# Patient Record
Sex: Female | Born: 1975 | Race: White | Hispanic: No | Marital: Single | Smoking: Current every day smoker
Health system: Southern US, Community
[De-identification: ages and names within clinical notes are randomized; demographics above are authoritative.]

## PROBLEM LIST (undated history)

## (undated) DIAGNOSIS — F329 Major depressive disorder, single episode, unspecified: Secondary | ICD-10-CM

## (undated) DIAGNOSIS — Z9889 Other specified postprocedural states: Secondary | ICD-10-CM

## (undated) DIAGNOSIS — I10 Essential (primary) hypertension: Secondary | ICD-10-CM

## (undated) DIAGNOSIS — I219 Acute myocardial infarction, unspecified: Secondary | ICD-10-CM

## (undated) DIAGNOSIS — K219 Gastro-esophageal reflux disease without esophagitis: Secondary | ICD-10-CM

## (undated) DIAGNOSIS — R102 Pelvic and perineal pain: Secondary | ICD-10-CM

## (undated) DIAGNOSIS — F32A Depression, unspecified: Secondary | ICD-10-CM

## (undated) DIAGNOSIS — R109 Unspecified abdominal pain: Secondary | ICD-10-CM

## (undated) DIAGNOSIS — G8929 Other chronic pain: Secondary | ICD-10-CM

## (undated) DIAGNOSIS — F419 Anxiety disorder, unspecified: Secondary | ICD-10-CM

## (undated) DIAGNOSIS — Z9289 Personal history of other medical treatment: Secondary | ICD-10-CM

## (undated) DIAGNOSIS — F410 Panic disorder [episodic paroxysmal anxiety] without agoraphobia: Secondary | ICD-10-CM

## (undated) HISTORY — PX: NO PAST SURGERIES: SHX2092

---

## 2014-05-31 ENCOUNTER — Emergency Department (HOSPITAL_COMMUNITY)
Admission: EM | Admit: 2014-05-31 | Discharge: 2014-05-31 | Disposition: A | Discharge status: Home / Self Care | Payer: Self-pay | Attending: Emergency Medicine | Admitting: Emergency Medicine

## 2014-05-31 ENCOUNTER — Emergency Department (HOSPITAL_COMMUNITY): Payer: Self-pay

## 2014-05-31 ENCOUNTER — Encounter (HOSPITAL_COMMUNITY): Payer: Self-pay | Admitting: *Deleted

## 2014-05-31 DIAGNOSIS — I10 Essential (primary) hypertension: Secondary | ICD-10-CM | POA: Insufficient documentation

## 2014-05-31 DIAGNOSIS — E119 Type 2 diabetes mellitus without complications: Secondary | ICD-10-CM | POA: Insufficient documentation

## 2014-05-31 DIAGNOSIS — R1011 Right upper quadrant pain: Secondary | ICD-10-CM | POA: Insufficient documentation

## 2014-05-31 DIAGNOSIS — R0602 Shortness of breath: Secondary | ICD-10-CM | POA: Insufficient documentation

## 2014-05-31 DIAGNOSIS — Z792 Long term (current) use of antibiotics: Secondary | ICD-10-CM | POA: Insufficient documentation

## 2014-05-31 DIAGNOSIS — Z880 Allergy status to penicillin: Secondary | ICD-10-CM | POA: Insufficient documentation

## 2014-05-31 DIAGNOSIS — Z3202 Encounter for pregnancy test, result negative: Secondary | ICD-10-CM | POA: Insufficient documentation

## 2014-05-31 DIAGNOSIS — R05 Cough: Secondary | ICD-10-CM | POA: Insufficient documentation

## 2014-05-31 DIAGNOSIS — Z720 Tobacco use: Secondary | ICD-10-CM | POA: Insufficient documentation

## 2014-05-31 DIAGNOSIS — F419 Anxiety disorder, unspecified: Secondary | ICD-10-CM | POA: Insufficient documentation

## 2014-05-31 DIAGNOSIS — R079 Chest pain, unspecified: Secondary | ICD-10-CM | POA: Insufficient documentation

## 2014-05-31 DIAGNOSIS — Z79899 Other long term (current) drug therapy: Secondary | ICD-10-CM | POA: Insufficient documentation

## 2014-05-31 HISTORY — DX: Essential (primary) hypertension: I10

## 2014-05-31 HISTORY — DX: Anxiety disorder, unspecified: F41.9

## 2014-05-31 LAB — POC URINE PREG, ED: PREG TEST UR: NEGATIVE

## 2014-05-31 LAB — CBC WITH DIFFERENTIAL/PLATELET
BASOS ABS: 0.1 10*3/uL (ref 0.0–0.1)
BASOS PCT: 1 % (ref 0–1)
EOS ABS: 0.2 10*3/uL (ref 0.0–0.7)
Eosinophils Relative: 2 % (ref 0–5)
HCT: 45.1 % (ref 36.0–46.0)
Hemoglobin: 14.8 g/dL (ref 12.0–15.0)
Lymphocytes Relative: 28 % (ref 12–46)
Lymphs Abs: 2.7 10*3/uL (ref 0.7–4.0)
MCH: 32.9 pg (ref 26.0–34.0)
MCHC: 32.8 g/dL (ref 30.0–36.0)
MCV: 100.2 fL — ABNORMAL HIGH (ref 78.0–100.0)
Monocytes Absolute: 0.6 10*3/uL (ref 0.1–1.0)
Monocytes Relative: 6 % (ref 3–12)
NEUTROS ABS: 6.2 10*3/uL (ref 1.7–7.7)
NEUTROS PCT: 63 % (ref 43–77)
PLATELETS: 240 10*3/uL (ref 150–400)
RBC: 4.5 MIL/uL (ref 3.87–5.11)
RDW: 13.8 % (ref 11.5–15.5)
WBC: 9.8 10*3/uL (ref 4.0–10.5)

## 2014-05-31 LAB — COMPREHENSIVE METABOLIC PANEL
ALBUMIN: 3.5 g/dL (ref 3.5–5.2)
ALK PHOS: 79 U/L (ref 39–117)
ALT: 11 U/L (ref 0–35)
AST: 14 U/L (ref 0–37)
Anion gap: 11 (ref 5–15)
BUN: 7 mg/dL (ref 6–23)
CHLORIDE: 105 meq/L (ref 96–112)
CO2: 25 mEq/L (ref 19–32)
Calcium: 8.6 mg/dL (ref 8.4–10.5)
Creatinine, Ser: 0.82 mg/dL (ref 0.50–1.10)
GFR calc Af Amer: >90 mL/min (ref >90)
GFR calc non Af Amer: >90 mL/min (ref >90)
Glucose, Bld: 71 mg/dL (ref 70–99)
POTASSIUM: 4.2 meq/L (ref 3.7–5.3)
SODIUM: 141 meq/L (ref 137–147)
TOTAL PROTEIN: 6.4 g/dL (ref 6.0–8.3)
Total Bilirubin: <0.2 mg/dL — ABNORMAL LOW (ref 0.3–1.2)

## 2014-05-31 LAB — URINALYSIS, ROUTINE W REFLEX MICROSCOPIC
Bilirubin Urine: NEGATIVE
Glucose, UA: NEGATIVE mg/dL
HGB URINE DIPSTICK: NEGATIVE
Ketones, ur: NEGATIVE mg/dL
Leukocytes, UA: NEGATIVE
Nitrite: NEGATIVE
PH: 6.5 (ref 5.0–8.0)
Protein, ur: NEGATIVE mg/dL
SPECIFIC GRAVITY, URINE: 1.025 (ref 1.005–1.030)
Urobilinogen, UA: 0.2 mg/dL (ref 0.0–1.0)

## 2014-05-31 LAB — TROPONIN I

## 2014-05-31 LAB — LIPASE, BLOOD: Lipase: 34 U/L (ref 11–59)

## 2014-05-31 MED ORDER — PANTOPRAZOLE SODIUM 20 MG PO TBEC: 20.0000 mg | DELAYED_RELEASE_TABLET | Freq: Every day | ORAL | Status: DC

## 2014-05-31 MED ORDER — ONDANSETRON HCL 4 MG/2ML IJ SOLN
4.0000 mg | Freq: Once | INTRAMUSCULAR | Status: AC
  Administered 2014-05-31: 4 mg via INTRAVENOUS
  Filled 2014-05-31: qty 2

## 2014-05-31 MED ORDER — KETOROLAC TROMETHAMINE 60 MG/2ML IM SOLN
INTRAMUSCULAR | Status: AC
  Filled 2014-05-31: qty 2

## 2014-05-31 MED ORDER — OXYCODONE-ACETAMINOPHEN 5-325 MG PO TABS: 2.0000 | ORAL_TABLET | ORAL | Status: DC | PRN

## 2014-05-31 MED ORDER — HYDROMORPHONE HCL 1 MG/ML IJ SOLN
1.0000 mg | Freq: Once | INTRAMUSCULAR | Status: AC
Start: 2014-05-31 — End: 2014-05-31
  Administered 2014-05-31: 1 mg via INTRAVENOUS
  Filled 2014-05-31: qty 1

## 2014-05-31 MED ORDER — PROMETHAZINE HCL 25 MG PO TABS: 25.0000 mg | ORAL_TABLET | Freq: Four times a day (QID) | ORAL | Status: DC | PRN

## 2014-05-31 MED ORDER — KETOROLAC TROMETHAMINE 30 MG/ML IJ SOLN
30.0000 mg | Freq: Once | INTRAMUSCULAR | Status: AC
  Administered 2014-05-31: 30 mg via INTRAVENOUS

## 2014-05-31 MED ORDER — HYDROMORPHONE HCL 1 MG/ML IJ SOLN
1.0000 mg | Freq: Once | INTRAMUSCULAR | Status: AC
  Administered 2014-05-31: 1 mg via INTRAVENOUS
  Filled 2014-05-31: qty 1

## 2014-05-31 MED ORDER — SUCRALFATE 1 GM/10ML PO SUSP: 1.0000 g | Freq: Three times a day (TID) | ORAL | Status: DC

## 2014-05-31 NOTE — ED Provider Notes (Signed)
CSN: [id number]     Arrival date & time 05/31/14  2020 History  This chart was scribed for Melissa Butler, * by Roxy Cedar, ED Scribe. This patient was seen in room APA14/APA14 and the patient's care was started at 8:42 PM.   Chief Complaint  Patient presents with  . Chest Pain   Patient is a 38 y.o. female presenting with chest pain. The history is provided by the patient. No language interpreter was used.  Chest Pain Associated symptoms: cough    HPI Comments: [name] is a 38 y.o. female who presents to the Emergency Department complaining of constant, gradually worsening chest pain which radiates to her back which began 1 week ago. She reports associated SOB, and productive cough with green sputum. She denies any knowledge of family history of cardiac problems. She also reports RUQ abdominal pain that occurs when eating. She states that she has a noticed a decrease in appetite for the past few days. Past Medical History  Diagnosis Date  . Diabetes mellitus without complication   . Hypertension   . Anxiety    Past Surgical History  Procedure Laterality Date  . Cesarean section     History reviewed. No pertinent family history. History  Substance Use Topics  . Smoking status: Current Some Day Smoker -- 0.50 packs/day  . Smokeless tobacco: Not on file  . Alcohol Use: No   OB History    No data available     Review of Systems  Respiratory: Positive for cough.   Cardiovascular: Positive for chest pain.  All other systems reviewed and are negative.  Allergies  Ciprofloxacin and Penicillins  Home Medications   Prior to Admission medications   Medication Sig Start Date End Date Taking? Authorizing Provider  budesonide-formoterol (SYMBICORT) 80-4.5 MCG/ACT inhaler Inhale 2 puffs into the lungs 2 (two) times daily as needed (Shortness of Breath).   Yes Historical Provider, MD  busPIRone (BUSPAR) 15 MG tablet Take 15 mg by mouth daily.   Yes Historical  Provider, MD  cefdinir (OMNICEF) 300 MG capsule Take 300 mg by mouth once.   Yes Historical Provider, MD  Dextromethorphan-Guaifenesin (MUCINEX FAST-MAX DM MAX) 5-100 MG/5ML LIQD Take 20 mg by mouth every 4 (four) hours as needed (Chest Congestion).   Yes Historical Provider, MD  DULoxetine (CYMBALTA) 60 MG capsule Take 60 mg by mouth daily.   Yes Historical Provider, MD  ranitidine (ZANTAC) 150 MG tablet Take 150 mg by mouth daily.   Yes Historical Provider, MD   Triage Vitals: LMP  (LMP Unknown)  Physical Exam  Constitutional: She is oriented to person, place, and time. She appears well-developed and well-nourished. No distress.  HENT:  Head: Normocephalic and atraumatic.  Right Ear: Hearing normal.  Left Ear: Hearing normal.  Nose: Nose normal.  Mouth/Throat: Oropharynx is clear and moist and mucous membranes are normal.  Eyes: Conjunctivae and EOM are normal. Pupils are equal, round, and reactive to light.  Neck: Normal range of motion. Neck supple.  Cardiovascular: Regular rhythm, S1 normal and S2 normal.  Exam reveals no gallop and no friction rub.   No murmur heard. Pulmonary/Chest: Effort normal and breath sounds normal. No respiratory distress. She exhibits no tenderness.  Abdominal: Soft. Normal appearance and bowel sounds are normal. There is no hepatosplenomegaly. There is tenderness. There is no rebound, no guarding, no tenderness at McBurney's point and negative Murphy's sign. No hernia.  Tenderness to RUQ. No Guarding. No Rebound. No McBurney's sign.  Musculoskeletal: Normal  range of motion.  Neurological: She is alert and oriented to person, place, and time. She has normal strength. No cranial nerve deficit or sensory deficit. Coordination normal. GCS eye subscore is 4. GCS verbal subscore is 5. GCS motor subscore is 6.  Skin: Skin is warm, dry and intact. No rash noted. No cyanosis.  Psychiatric: She has a normal mood and affect. Her speech is normal and behavior is normal.  Thought content normal.  Nursing note and vitals reviewed.  ED Course  Procedures (including critical care time)  DIAGNOSTIC STUDIES:  COORDINATION OF CARE: 8:42 PM- Discussed plans to obtain diagnostic CXR, EKG and lab work. Pt advised of plan for treatment and pt agrees.  Labs Review Labs Reviewed  CBC WITH DIFFERENTIAL - Abnormal; Notable for the following:    MCV 100.2 (*)    All other components within normal limits  COMPREHENSIVE METABOLIC PANEL - Abnormal; Notable for the following:    Total Bilirubin <0.2 (*)    All other components within normal limits  URINALYSIS, ROUTINE W REFLEX MICROSCOPIC - Abnormal; Notable for the following:    APPearance CLOUDY (*)    All other components within normal limits  LIPASE, BLOOD  TROPONIN I  POC URINE PREG, ED    Imaging Review Dg Chest 2 View  05/31/2014   CLINICAL DATA:  Productive cough. Central chest discomfort. Body aches. Weakness. Worsening today.  EXAM: CHEST  2 VIEW  COMPARISON:  None.  FINDINGS: Pulmonary hyperinflation. The heart size and mediastinal contours are within normal limits. Both lungs are clear. The visualized skeletal structures are unremarkable.  IMPRESSION: No active cardiopulmonary disease.   Electronically Signed   By: Burman Nieves M.D.   On: 05/31/2014 21:41     EKG Interpretation   Date/Time:  Saturday May 31 2014 21:14:37 EST Ventricular Rate:  78 PR Interval:  140 QRS Duration: 81 QT Interval:  378 QTC Calculation: 430 R Axis:   -22 Text Interpretation:  Sinus rhythm Borderline left axis deviation Low  voltage, precordial leads Confirmed by Caitlyne Ingham  MD, Jihad Brownlow [phone]) on  05/31/2014 9:37:04 PM     MDM   Final diagnoses:  Chest pain   Patient presents to the ER for evaluation ofchest pain. Patient reports that the symptoms began 1 week ago and have been present continuously ever since. Patient reports that she has significant worsening of the pain when she eats. Pain is in the  central chest and upper abdomen, radiates into her back. Patient also reports productive cough and some shortness of breath. Her chest x-ray was unremarkable, no pneumonia. She does not have any active wheezing on examination.  Examination of the abdomen revealed tenderness in the epigastric and right upper quadrant tenderness. No clear Murphy sign, but does have maximum tenderness in the right upper quadrant. Patient has a normal white blood cell count. LFTs are normal. Lipase is normal.patient will require ultrasound to rule out gallstone as a cause of her upper abdominal and chest pain radiating into the back.  Patient had normal cardiac workup here in the ER.as she has had continuous pain for a week, I do not feel that she needs further cardiac evaluation. Negative troponin today is very reassuring.  History administered IV analgesia and will be discharged with prescription for Percocet and ranitidine. She will be scheduled for outpatient abdominal ultrasound.  I personally performed the services described in this documentation, which was scribed in my presence. The recorded information has been reviewed and is accurate.  Melissa Creasehristopher J. Rabia Argote, MD 06/01/14 [phone]

## 2014-05-31 NOTE — Discharge Instructions (Signed)

## 2014-05-31 NOTE — ED Notes (Signed)
Pt c/o chest pain that radiates to back; pt has had a productive cough with green sputum x several days

## 2014-06-01 ENCOUNTER — Other Ambulatory Visit (HOSPITAL_COMMUNITY): Payer: Self-pay

## 2014-06-02 ENCOUNTER — Other Ambulatory Visit (HOSPITAL_COMMUNITY): Payer: Self-pay | Admitting: Emergency Medicine

## 2014-06-02 ENCOUNTER — Inpatient Hospital Stay (HOSPITAL_COMMUNITY): Admission: RE | Admit: 2014-06-02 | Payer: Self-pay | Source: Ambulatory Visit

## 2014-06-02 DIAGNOSIS — R1011 Right upper quadrant pain: Secondary | ICD-10-CM

## 2014-06-02 MED FILL — Oxycodone w/ Acetaminophen Tab 5-325 MG: ORAL | Qty: 6 | Status: AC

## 2014-07-01 ENCOUNTER — Encounter (HOSPITAL_COMMUNITY): Payer: Self-pay | Admitting: *Deleted

## 2014-07-01 ENCOUNTER — Emergency Department (HOSPITAL_COMMUNITY)
Admission: EM | Admit: 2014-07-01 | Discharge: 2014-07-01 | Disposition: A | Discharge status: Home / Self Care | Payer: Self-pay | Attending: Emergency Medicine | Admitting: Emergency Medicine

## 2014-07-01 DIAGNOSIS — F419 Anxiety disorder, unspecified: Secondary | ICD-10-CM | POA: Insufficient documentation

## 2014-07-01 DIAGNOSIS — I10 Essential (primary) hypertension: Secondary | ICD-10-CM | POA: Insufficient documentation

## 2014-07-01 DIAGNOSIS — Z792 Long term (current) use of antibiotics: Secondary | ICD-10-CM | POA: Insufficient documentation

## 2014-07-01 DIAGNOSIS — Z720 Tobacco use: Secondary | ICD-10-CM | POA: Insufficient documentation

## 2014-07-01 DIAGNOSIS — R202 Paresthesia of skin: Secondary | ICD-10-CM | POA: Insufficient documentation

## 2014-07-01 DIAGNOSIS — Z79899 Other long term (current) drug therapy: Secondary | ICD-10-CM | POA: Insufficient documentation

## 2014-07-01 DIAGNOSIS — Z880 Allergy status to penicillin: Secondary | ICD-10-CM | POA: Insufficient documentation

## 2014-07-01 DIAGNOSIS — E119 Type 2 diabetes mellitus without complications: Secondary | ICD-10-CM | POA: Insufficient documentation

## 2014-07-01 MED ORDER — LISINOPRIL 10 MG PO TABS: 10.0000 mg | ORAL_TABLET | Freq: Every day | ORAL | Status: DC

## 2014-07-01 NOTE — ED Notes (Signed)
Pt states she has high blood pressure and she ran out of her meds 4 months ago.

## 2014-07-01 NOTE — ED Provider Notes (Signed)
CSN: [id number]     Arrival date & time 07/01/14  1958 History  This chart was scribed for Vida Roller, MD by Gwenyth Ober, ED Scribe. This patient was seen in room APA08/APA08 and the patient's care was started at 8:58 PM.   Chief Complaint  Patient presents with  . Hypertension    The history is provided by the patient. No language interpreter was used.    HPI Comments: Melissa Butler is a 38 y.o. female with a history of DM who presents to the Emergency Department complaining of recurrent, constant hypertension. She states a vague sensation of tingling all over and light-headedness as associated symptoms. Pt states her last at-home measurement PTA was 154/117 with 111 HR. Pt has a history of HTN. She ran out of medication 4 months ago and notes that she used to take Coreg and Clonopin. Pt denies EtOH and substance abuse, but smokes cigarettes. Pt has history of 2 Caesarean Sections. She denies numbness, weakness, blurred vision, diarrhea, vomiting, nausea and leg swelling as associated symptoms.   No PCP Past Medical History  Diagnosis Date  . Diabetes mellitus without complication   . Hypertension   . Anxiety    Past Surgical History  Procedure Laterality Date  . Cesarean section     No family history on file. History  Substance Use Topics  . Smoking status: Current Some Day Smoker -- 0.50 packs/day  . Smokeless tobacco: Not on file  . Alcohol Use: No   OB History    No data available     Review of Systems  Eyes: Negative for visual disturbance.  Cardiovascular: Negative for leg swelling.  Gastrointestinal: Negative for nausea, vomiting and diarrhea.  Neurological: Positive for light-headedness. Negative for weakness and numbness.       Tingling   All other systems reviewed and are negative.  Allergies  Ciprofloxacin; Penicillins; and Sulfa antibiotics  Home Medications   Prior to Admission medications   Medication Sig Start Date End Date Taking? Authorizing  Provider  budesonide-formoterol (SYMBICORT) 80-4.5 MCG/ACT inhaler Inhale 2 puffs into the lungs 2 (two) times daily as needed (Shortness of Breath).    Historical Provider, MD  busPIRone (BUSPAR) 15 MG tablet Take 15 mg by mouth daily.    Historical Provider, MD  cefdinir (OMNICEF) 300 MG capsule Take 300 mg by mouth once.    Historical Provider, MD  Dextromethorphan-Guaifenesin (MUCINEX FAST-MAX DM MAX) 5-100 MG/5ML LIQD Take 20 mg by mouth every 4 (four) hours as needed (Chest Congestion).    Historical Provider, MD  DULoxetine (CYMBALTA) 60 MG capsule Take 60 mg by mouth daily.    Historical Provider, MD  lisinopril (PRINIVIL,ZESTRIL) 10 MG tablet Take 1 tablet (10 mg total) by mouth daily. 07/01/14   Vida Roller, MD  oxyCODONE-acetaminophen (PERCOCET) 5-325 MG per tablet Take 2 tablets by mouth every 4 (four) hours as needed. 05/31/14   Gilda Crease, MD  oxyCODONE-acetaminophen (PERCOCET) 5-325 MG per tablet Take 2 tablets by mouth every 4 (four) hours as needed. 05/31/14   Gilda Crease, MD  pantoprazole (PROTONIX) 20 MG tablet Take 1 tablet (20 mg total) by mouth daily. 05/31/14   Gilda Crease, MD  promethazine (PHENERGAN) 25 MG tablet Take 1 tablet (25 mg total) by mouth every 6 (six) hours as needed for nausea or vomiting. 05/31/14   Gilda Crease, MD  ranitidine (ZANTAC) 150 MG tablet Take 150 mg by mouth daily.    Historical Provider, MD  sucralfate (CARAFATE) 1 GM/10ML suspension Take 10 mLs (1 g total) by mouth 4 (four) times daily -  with meals and at bedtime. 05/31/14   Gilda Creasehristopher J. Pollina, MD   BP 145/88 mmHg  Pulse 113  Temp(Src) 98 F (36.7 C)  Resp 20  Ht 5\' 3"  (1.6 m)  Wt 150 lb (68.04 kg)  BMI 26.58 kg/m2  SpO2 99% Physical Exam  Constitutional: She appears well-developed and well-nourished. No distress.  HENT:  Head: Normocephalic and atraumatic.  Mouth/Throat: Oropharynx is clear and moist. No oropharyngeal exudate.  Eyes:  Conjunctivae and EOM are normal. Pupils are equal, round, and reactive to light. Right eye exhibits no discharge. Left eye exhibits no discharge. No scleral icterus.  Neck: Normal range of motion. Neck supple. No JVD present. No thyromegaly present.  Cardiovascular: Normal rate, regular rhythm, normal heart sounds and intact distal pulses.  Exam reveals no gallop and no friction rub.   No murmur heard. Pulmonary/Chest: Effort normal and breath sounds normal. No respiratory distress. She has no wheezes. She has no rales.  Abdominal: Soft. Bowel sounds are normal. She exhibits no distension and no mass. There is no tenderness.  Musculoskeletal: Normal range of motion. She exhibits no edema or tenderness.  Lymphadenopathy:    She has no cervical adenopathy.  Neurological: She is alert. Coordination normal.  Skin: Skin is warm and dry. No rash noted. No erythema.  Psychiatric: She has a normal mood and affect. Her behavior is normal.  Nursing note and vitals reviewed.   ED Course  Procedures (including critical care time) DIAGNOSTIC STUDIES: Oxygen Saturation is 99% on RA, normal by my interpretation.    COORDINATION OF CARE: 9:01 PM Discussed treatment plan with pt which includes Lisinopril and pt agreed to plan. Advised pt of side-effects, including angioedema, of ACE inhibitors. She agreed to the medication.  Labs Review Labs Reviewed - No data to display  Imaging Review No results found.    MDM   Final diagnoses:  Essential hypertension    Normal exam, BP minimally elevated - start lisinopril, pt in agreement, no indication for other tx at this time.   I personally performed the services described in this documentation, which was scribed in my presence. The recorded information has been reviewed and is accurate.       Vida RollerBrian D Kristianne Albin, MD 07/02/14 [phone]

## 2014-07-01 NOTE — Discharge Instructions (Signed)
Hypertension °Hypertension, commonly called high blood pressure, is when the force of blood pumping through your arteries is too strong. Your arteries are the blood vessels that carry blood from your heart throughout your body. A blood pressure reading consists of a higher number over a lower number, such as 110/72. The higher number (systolic) is the pressure inside your arteries when your heart pumps. The lower number (diastolic) is the pressure inside your arteries when your heart relaxes. Ideally you want your blood pressure below 120/80. °Hypertension forces your heart to work harder to pump blood. Your arteries may become narrow or stiff. Having hypertension puts you at risk for heart disease, stroke, and other problems.  °RISK FACTORS °Some risk factors for high blood pressure are controllable. Others are not.  °Risk factors you cannot control include:  °· Race. You may be at higher risk if you are African American. °· Age. Risk increases with age. °· Gender. Men are at higher risk than women before age 45 years. After age 65, women are at higher risk than men. °Risk factors you can control include: °· Not getting enough exercise or physical activity. °· Being overweight. °· Getting too much fat, sugar, calories, or salt in your diet. °· Drinking too much alcohol. °SIGNS AND SYMPTOMS °Hypertension does not usually cause signs or symptoms. Extremely high blood pressure (hypertensive crisis) may cause headache, anxiety, shortness of breath, and nosebleed. °DIAGNOSIS  °To check if you have hypertension, your health care provider will measure your blood pressure while you are seated, with your arm held at the level of your heart. It should be measured at least twice using the same arm. Certain conditions can cause a difference in blood pressure between your right and left arms. A blood pressure reading that is higher than normal on one occasion does not mean that you need treatment. If one blood pressure reading  is high, ask your health care provider about having it checked again. °TREATMENT  °Treating high blood pressure includes making lifestyle changes and possibly taking medicine. Living a healthy lifestyle can help lower high blood pressure. You may need to change some of your habits. °Lifestyle changes may include: °· Following the DASH diet. This diet is high in fruits, vegetables, and whole grains. It is low in salt, red meat, and added sugars. °· Getting at least 2½ hours of brisk physical activity every week. °· Losing weight if necessary. °· Not smoking. °· Limiting alcoholic beverages. °· Learning ways to reduce stress. ° If lifestyle changes are not enough to get your blood pressure under control, your health care provider may prescribe medicine. You may need to take more than one. Work closely with your health care provider to understand the risks and benefits. °HOME CARE INSTRUCTIONS °· Have your blood pressure rechecked as directed by your health care provider.   °· Take medicines only as directed by your health care provider. Follow the directions carefully. Blood pressure medicines must be taken as prescribed. The medicine does not work as well when you skip doses. Skipping doses also puts you at risk for problems.   °· Do not smoke.   °· Monitor your blood pressure at home as directed by your health care provider.  °SEEK MEDICAL CARE IF:  °· You think you are having a reaction to medicines taken. °· You have recurrent headaches or feel dizzy. °· You have swelling in your ankles. °· You have trouble with your vision. °SEEK IMMEDIATE MEDICAL CARE IF: °· You develop a severe headache or confusion. °·   You have unusual weakness, numbness, or feel faint.  You have severe chest or abdominal pain.  You vomit repeatedly.  You have trouble breathing. MAKE SURE YOU:   Understand these instructions.  Will watch your condition.  Will get help right away if you are not doing well or get worse. Document  Released: 07/11/2005 Document Revised: 11/25/2013 Document Reviewed: 05/03/2013 [facility] Patient Information 2015 [place], [place]. This information is not intended to replace advice given to you by your health care provider. Make sure you discuss any questions you have with your health care provider.   [facility] Primary Care Doctor List    Kari Baars MD. Specialty: Pulmonary Disease Contact information: [address]  [address]  [place] [address]  [phone]   Syliva Overman, MD. Specialty: [facility] Medicine Contact information: [address]  [place] [address]  [phone]   Lilyan Punt, MD. Specialty: [facility] Contact information: [address]  [place] [address]  [phone]   Avon Gully, MD Specialty: Internal Medicine Contact information: [address] [place] [address]  [phone]   Catalina Pizza, MD. Specialty: Internal Medicine Contact information: [address]  [place] [address]  [phone]   Butch Penny, MD. Specialty: Family Medicine Contact information: [address]  [place] [address]  [phone]   John Giovanni, MD. Specialty: [facility] Medicine Contact information: [address]  [address]  [place] [address]  [phone]   Carylon Perches, MD. Specialty: Internal Medicine Contact information: [address]  [address]  [place] [address]  [phone]

## 2014-07-03 ENCOUNTER — Emergency Department (HOSPITAL_COMMUNITY): Payer: Self-pay

## 2014-07-03 ENCOUNTER — Emergency Department (HOSPITAL_COMMUNITY)
Admission: EM | Admit: 2014-07-03 | Discharge: 2014-07-03 | Disposition: A | Discharge status: Home / Self Care | Payer: Self-pay | Attending: Emergency Medicine | Admitting: Emergency Medicine

## 2014-07-03 ENCOUNTER — Encounter (HOSPITAL_COMMUNITY): Payer: Self-pay | Admitting: Emergency Medicine

## 2014-07-03 DIAGNOSIS — E119 Type 2 diabetes mellitus without complications: Secondary | ICD-10-CM | POA: Insufficient documentation

## 2014-07-03 DIAGNOSIS — R079 Chest pain, unspecified: Secondary | ICD-10-CM

## 2014-07-03 DIAGNOSIS — R197 Diarrhea, unspecified: Secondary | ICD-10-CM | POA: Insufficient documentation

## 2014-07-03 DIAGNOSIS — Z880 Allergy status to penicillin: Secondary | ICD-10-CM | POA: Insufficient documentation

## 2014-07-03 DIAGNOSIS — Z79899 Other long term (current) drug therapy: Secondary | ICD-10-CM | POA: Insufficient documentation

## 2014-07-03 DIAGNOSIS — Z3202 Encounter for pregnancy test, result negative: Secondary | ICD-10-CM | POA: Insufficient documentation

## 2014-07-03 DIAGNOSIS — Z720 Tobacco use: Secondary | ICD-10-CM | POA: Insufficient documentation

## 2014-07-03 DIAGNOSIS — F419 Anxiety disorder, unspecified: Secondary | ICD-10-CM | POA: Insufficient documentation

## 2014-07-03 DIAGNOSIS — I10 Essential (primary) hypertension: Secondary | ICD-10-CM | POA: Insufficient documentation

## 2014-07-03 LAB — CBC WITH DIFFERENTIAL/PLATELET
BASOS ABS: 0 10*3/uL (ref 0.0–0.1)
BASOS PCT: 0 % (ref 0–1)
EOS ABS: 0 10*3/uL (ref 0.0–0.7)
Eosinophils Relative: 0 % (ref 0–5)
HCT: 50.7 % — ABNORMAL HIGH (ref 36.0–46.0)
Hemoglobin: 17.1 g/dL — ABNORMAL HIGH (ref 12.0–15.0)
Lymphocytes Relative: 13 % (ref 12–46)
Lymphs Abs: 1.6 10*3/uL (ref 0.7–4.0)
MCH: 32.5 pg (ref 26.0–34.0)
MCHC: 33.7 g/dL (ref 30.0–36.0)
MCV: 96.4 fL (ref 78.0–100.0)
MONOS PCT: 4 % (ref 3–12)
Monocytes Absolute: 0.5 10*3/uL (ref 0.1–1.0)
NEUTROS ABS: 9.6 10*3/uL — AB (ref 1.7–7.7)
Neutrophils Relative %: 83 % — ABNORMAL HIGH (ref 43–77)
PLATELETS: 240 10*3/uL (ref 150–400)
RBC: 5.26 MIL/uL — ABNORMAL HIGH (ref 3.87–5.11)
RDW: 13 % (ref 11.5–15.5)
WBC: 11.7 10*3/uL — ABNORMAL HIGH (ref 4.0–10.5)

## 2014-07-03 LAB — COMPREHENSIVE METABOLIC PANEL
ALBUMIN: 4.3 g/dL (ref 3.5–5.2)
ALK PHOS: 99 U/L (ref 39–117)
ALT: 11 U/L (ref 0–35)
AST: 14 U/L (ref 0–37)
Anion gap: 14 (ref 5–15)
BUN: 4 mg/dL — AB (ref 6–23)
CO2: 25 mEq/L (ref 19–32)
Calcium: 9.4 mg/dL (ref 8.4–10.5)
Chloride: 104 mEq/L (ref 96–112)
Creatinine, Ser: 0.73 mg/dL (ref 0.50–1.10)
GFR calc Af Amer: >90 mL/min (ref >90)
GFR calc non Af Amer: >90 mL/min (ref >90)
Glucose, Bld: 93 mg/dL (ref 70–99)
Potassium: 3.7 mEq/L (ref 3.7–5.3)
SODIUM: 143 meq/L (ref 137–147)
TOTAL PROTEIN: 7.5 g/dL (ref 6.0–8.3)
Total Bilirubin: 0.4 mg/dL (ref 0.3–1.2)

## 2014-07-03 LAB — URINALYSIS, ROUTINE W REFLEX MICROSCOPIC
Bilirubin Urine: NEGATIVE
Glucose, UA: NEGATIVE mg/dL
HGB URINE DIPSTICK: NEGATIVE
Ketones, ur: NEGATIVE mg/dL
Leukocytes, UA: NEGATIVE
Nitrite: NEGATIVE
PH: 6 (ref 5.0–8.0)
Protein, ur: NEGATIVE mg/dL
UROBILINOGEN UA: 0.2 mg/dL (ref 0.0–1.0)

## 2014-07-03 LAB — TROPONIN I: Troponin I: <0.3 ng/mL (ref <0.30)

## 2014-07-03 LAB — POC URINE PREG, ED: PREG TEST UR: NEGATIVE

## 2014-07-03 LAB — LIPASE, BLOOD: LIPASE: 13 U/L (ref 11–59)

## 2014-07-03 MED ORDER — IOHEXOL 350 MG/ML SOLN
100.0000 mL | Freq: Once | INTRAVENOUS | Status: AC | PRN
Start: 2014-07-03 — End: 2014-07-03
  Administered 2014-07-03: 100 mL via INTRAVENOUS

## 2014-07-03 MED ORDER — ONDANSETRON HCL 4 MG/2ML IJ SOLN
4.0000 mg | Freq: Once | INTRAMUSCULAR | Status: AC
  Administered 2014-07-03: 4 mg via INTRAVENOUS
  Filled 2014-07-03: qty 2

## 2014-07-03 MED ORDER — SODIUM CHLORIDE 0.9 % IV BOLUS (SEPSIS)
1000.0000 mL | Freq: Once | INTRAVENOUS | Status: AC
  Administered 2014-07-03: 1000 mL via INTRAVENOUS

## 2014-07-03 MED ORDER — HYDROMORPHONE HCL 1 MG/ML IJ SOLN
1.0000 mg | Freq: Once | INTRAMUSCULAR | Status: AC
  Administered 2014-07-03: 1 mg via INTRAVENOUS
  Filled 2014-07-03: qty 1

## 2014-07-03 MED ORDER — SODIUM CHLORIDE 0.9 % IV BOLUS (SEPSIS)
1000.0000 mL | Freq: Once | INTRAVENOUS | Status: AC
Start: 2014-07-03 — End: 2014-07-03
  Administered 2014-07-03: 1000 mL via INTRAVENOUS

## 2014-07-03 MED ORDER — LORAZEPAM 1 MG PO TABS
1.0000 mg | ORAL_TABLET | Freq: Once | ORAL | Status: AC
  Administered 2014-07-03: 1 mg via ORAL
  Filled 2014-07-03: qty 1

## 2014-07-03 MED ORDER — LORAZEPAM 1 MG PO TABS
1.0000 mg | ORAL_TABLET | Freq: Three times a day (TID) | ORAL | Status: DC | PRN
Start: 2014-07-03 — End: 2014-11-22

## 2014-07-03 NOTE — Discharge Instructions (Signed)
Chest Pain (Nonspecific) °It is often hard to give a specific diagnosis for the cause of chest pain. There is always a chance that your pain could be related to something serious, such as a heart attack or a blood clot in the lungs. You need to follow up with your health care provider for further evaluation. °CAUSES  °· Heartburn. °· Pneumonia or bronchitis. °· Anxiety or stress. °· Inflammation around your heart (pericarditis) or lung (pleuritis or pleurisy). °· A blood clot in the lung. °· A collapsed lung (pneumothorax). It can develop suddenly on its own (spontaneous pneumothorax) or from trauma to the chest. °· Shingles infection (herpes zoster virus). °The chest wall is composed of bones, muscles, and cartilage. Any of these can be the source of the pain. °· The bones can be bruised by injury. °· The muscles or cartilage can be strained by coughing or overwork. °· The cartilage can be affected by inflammation and become sore (costochondritis). °DIAGNOSIS  °Lab tests or other studies may be needed to find the cause of your pain. Your health care provider may have you take a test called an ambulatory electrocardiogram (ECG). An ECG records your heartbeat patterns over a 24-hour period. You may also have other tests, such as: °· Transthoracic echocardiogram (TTE). During echocardiography, sound waves are used to evaluate how blood flows through your heart. °· Transesophageal echocardiogram (TEE). °· Cardiac monitoring. This allows your health care provider to monitor your heart rate and rhythm in real time. °· Holter monitor. This is a portable device that records your heartbeat and can help diagnose heart arrhythmias. It allows your health care provider to track your heart activity for several days, if needed. °· Stress tests by exercise or by giving medicine that makes the heart beat faster. °TREATMENT  °· Treatment depends on what may be causing your chest pain. Treatment may include: °· Acid blockers for  heartburn. °· Anti-inflammatory medicine. °· Pain medicine for inflammatory conditions. °· Antibiotics if an infection is present. °· You may be advised to change lifestyle habits. This includes stopping smoking and avoiding alcohol, caffeine, and chocolate. °· You may be advised to keep your head raised (elevated) when sleeping. This reduces the chance of acid going backward from your stomach into your esophagus. °Most of the time, nonspecific chest pain will improve within 2-3 days with rest and mild pain medicine.  °HOME CARE INSTRUCTIONS  °· If antibiotics were prescribed, take them as directed. Finish them even if you start to feel better. °· For the next few days, avoid physical activities that bring on chest pain. Continue physical activities as directed. °· Do not use any tobacco products, including cigarettes, chewing tobacco, or electronic cigarettes. °· Avoid drinking alcohol. °· Only take medicine as directed by your health care provider. °· Follow your health care provider's suggestions for further testing if your chest pain does not go away. °· Keep any follow-up appointments you made. If you do not go to an appointment, you could develop lasting (chronic) problems with pain. If there is any problem keeping an appointment, call to reschedule. °SEEK MEDICAL CARE IF:  °· Your chest pain does not go away, even after treatment. °· You have a rash with blisters on your chest. °· You have a fever. °SEEK IMMEDIATE MEDICAL CARE IF:  °· You have increased chest pain or pain that spreads to your arm, neck, jaw, back, or abdomen. °· You have shortness of breath. °· You have an increasing cough, or you cough   up blood.  You have severe back or abdominal pain.  You feel nauseous or vomit.  You have severe weakness.  You faint.  You have chills. This is an emergency. Do not wait to see if the pain will go away. Get medical help at once. Call your local emergency services (911 in [place] Do not drive  yourself to the hospital. MAKE SURE YOU:   Understand these instructions.  Will watch your condition.  Will get help right away if you are not doing well or get worse. Document Released: 04/20/2005 Document Revised: 07/16/2013 Document Reviewed: 02/14/2008 [facility] Patient Information 2015 [place], [place]. This information is not intended to replace advice given to you by your health care provider. Make sure you discuss any questions you have with your health care provider. Panic Attacks Panic attacks are sudden, short-livedsurges of severe anxiety, fear, or discomfort. They may occur for no reason when you are relaxed, when you are anxious, or when you are sleeping. Panic attacks may occur for a number of reasons:   Healthy people occasionally have panic attacks in extreme, life-threatening situations, such as war or natural disasters. Normal anxiety is a protective mechanism of the body that helps us react to danger (fight or flight response).  Panic attacks are often seen with anxiety disorders, such as panic disorder, social anxiety disorder, generalized anxiety disorder, and phobias. Anxiety disorders cause excessive or uncontrollable anxiety. They may interfere with your relationships or other life activities.  Panic attacks are sometimes seen with other mental illnesses, such as depression and posttraumatic stress disorder.  Certain medical conditions, prescription medicines, and drugs of abuse can cause panic attacks. SYMPTOMS  Panic attacks start suddenly, peak within 20 minutes, and are accompanied by four or more of the following symptoms:  Pounding heart or fast heart rate (palpitations).  Sweating.  Trembling or shaking.  Shortness of breath or feeling smothered.  Feeling choked.  Chest pain or discomfort.  Nausea or strange feeling in your stomach.  Dizziness, light-headedness, or feeling like you will faint.  Chills or hot flushes.  Numbness or tingling in  your lips or hands and feet.  Feeling that things are not real or feeling that you are not yourself.  Fear of losing control or going crazy.  Fear of dying. Some of these symptoms can mimic serious medical conditions. For example, you may think you are having a heart attack. Although panic attacks can be very scary, they are not life threatening. DIAGNOSIS  Panic attacks are diagnosed through an assessment by your health care provider. Your health care provider will ask questions about your symptoms, such as where and when they occurred. Your health care provider will also ask about your medical history and use of alcohol and drugs, including prescription medicines. Your health care provider may order blood tests or other studies to rule out a serious medical condition. Your health care provider may refer you to a mental health professional for further evaluation. TREATMENT   Most healthy people who have one or two panic attacks in an extreme, life-threatening situation will not require treatment.  The treatment for panic attacks associated with anxiety disorders or other mental illness typically involves counseling with a mental health professional, medicine, or a combination of both. Your health care provider will help determine what treatment is best for you.  Panic attacks due to physical illness usually go away with treatment of the illness. If prescription medicine is causing panic attacks, talk with your health care provider  about stopping the medicine, decreasing the dose, or substituting another medicine.  Panic attacks due to alcohol or drug abuse go away with abstinence. Some adults need professional help in order to stop drinking or using drugs. HOME CARE INSTRUCTIONS   Take all medicines as directed by your health care provider.   Schedule and attend follow-up visits as directed by your health care provider. It is important to keep all your appointments. SEEK MEDICAL CARE  IF:  You are not able to take your medicines as prescribed.  Your symptoms do not improve or get worse. SEEK IMMEDIATE MEDICAL CARE IF:   You experience panic attack symptoms that are different than your usual symptoms.  You have serious thoughts about hurting yourself or others.  You are taking medicine for panic attacks and have a serious side effect. MAKE SURE YOU:  Understand these instructions.  Will watch your condition.  Will get help right away if you are not doing well or get worse. Document Released: 07/11/2005 Document Revised: 07/16/2013 Document Reviewed: 02/22/2013 [facility] Patient Information 2015 [place], [place]. This information is not intended to replace advice given to you by your health care provider. Make sure you discuss any questions you have with your health care provider.   Emergency Department Resource Guide 1) Find a Doctor and Pay Out of Pocket Although you won't have to find out who is covered by your insurance plan, it is a good idea to ask around and get recommendations. You will then need to call the office and see if the doctor you have chosen will accept you as a new patient and what types of options they offer for patients who are self-pay. Some doctors offer discounts or will set up payment plans for their patients who do not have insurance, but you will need to ask so you aren't surprised when you get to your appointment.  2) Contact Your Local Health Department Not all health departments have doctors that can see patients for sick visits, but many do, so it is worth a call to see if yours does. If you don't know where your local health department is, you can check in your phone book. The CDC also has a tool to help you locate your state's health department, and many state websites also have listings of all of their local health departments.  3) Find a Walk-in Clinic If your illness is not likely to be very severe or complicated, you may want to try  a walk in clinic. These are popping up all over the country in pharmacies, drugstores, and shopping centers. They're usually staffed by nurse practitioners or physician assistants that have been trained to treat common illnesses and complaints. They're usually fairly quick and inexpensive. However, if you have serious medical issues or chronic medical problems, these are probably not your best option.  No Primary Care Doctor: - Call Health Connect at  [phone] - they can help you locate a primary care doctor that  accepts your insurance, provides certain services, etc. - Physician Referral Service- [phone]  Chronic Pain Problems: Organization         Address  Phone   Notes  [facility]  [phone] Patients need to be referred by their primary care doctor.   Medication Assistance: Organization         Address  Phone   Notes  [facility] Medication [facility] [address] [place] [place], [place] [address] [phone] --Must be a resident  of [place] -- Must have NO insurance coverage whatsoever (no Medicaid/ Medicare, etc.) -- The pt. MUST have a primary care doctor that directs their care regularly and follows them in the community   [facility]  [phone]   [employer]  [phone]    Agencies that provide inexpensive medical care: Organization         Address  Phone   Notes  [facility]  [phone]   [facility]    [phone]   [facility] [address] [place], [place] [address] [phone]   [facility] [address], [place] [phone]   Planned Parenthood    [phone]   [facility]    [phone]   [facility]  [address], [place] Phone:  [phone], Fax:  [phone] Hours of Operation:  9 am - 6 pm, M-F.  Also accepts Medicaid/Medicare and self-pay.  [facility]  [address], [place] Phone: [phone], Fax: [phone]. Hours of Operation:  8:30 am - 5:30 pm, M-F.  Also accepts Medicaid and self-pay.  [facility] [place] [address] [place] Phone: [phone]   [facility] [address] Natasha Bence [place], [place] [phone], Ext. [phone] Mondays & Thursdays: 7-9 AM.  First 15 patients are seen on a first come, first serve basis.    Medicaid-accepting [facility] Providers:  Organization         Address  Phone   Notes  [facility] [address], [place] [phone] Also accepts self-pay patients.  [facility] [address] Laurell Josephs [place], [place]  [phone]   [facility] [address], [place] [phone]   [facility] [address] [place] [phone]   Renaye Rakers [address], [place]   [phone] Only accepts [place] Access [place] patients after they have their name applied to their card.   Self-Pay (no insurance) in [facility]:  Organization         Address  Phone   Notes  Sickle Cell Patients, [facility] [address] [place], [place] [phone]   [facility] [address] [place], [place] [phone]   [facility] [place]  [address], [place] [phone]   [facility]/Dr. Osei-Bonsu  [address] [place] or [address], [place] [phone] Phone number for both [place] and [place] locations is the same.  [facility] [address], [place] [phone]   [facility] [address] [place] or [address] [phone]   [facility] [address], [place] [phone], phone; [phone], fax  Sees patients 1st and 3rd Saturday of every month.  Must not qualify for public or private insurance (i.e. Medicaid, Medicare, [place] Health Choice, Veterans' Benefits)  Household income should be no more than 200% of the poverty level The clinic cannot treat you if you are pregnant or think you are pregnant  Sexually transmitted diseases are not treated at the clinic.    Dental Care: Organization         Address  Phone  Notes  [facility] Department of [facility] [address] [place], [place] [phone] Accepts children up to age 17 who are enrolled in [place] or [place] Health Choice; pregnant women with a Medicaid card; and children who have applied for Medicaid or [place] Health Choice, but were declined, whose parents can pay a reduced fee at time of service.  [facility] Department of [facility]  [address], [place] [phone] Accepts children up to age 17 who are enrolled in [place] or [place] Health Choice; pregnant women with a Medicaid card; and children who have applied for Medicaid or [place] Health Choice, but were declined, whose parents can pay a reduced fee at time of service.  [facility]  [address] [place], [place] [phone] Patients are seen by appointment only. Walk-ins are not accepted. [facility] will see patients 21 years of age and older. Monday - Tuesday (8am-5pm) Most Wednesdays (8:30-5pm) $30 per visit, cash only  [facility]  [address], [address] [facility] [phone] Patients are seen by appointment only. Walk-ins are not accepted. [facility] will see patients 104 years of age and older. One Wednesday Evening (Monthly: Volunteer Based).  $30 per visit, cash only  [employer]  [phone] for adults; Children under age 81, call Graduate Pediatric Dentistry at [phone]. Children aged 20-14, please call [phone] to  request a pediatric application.  Dental services are provided in all areas of dental care including fillings, crowns and bridges, complete and partial dentures, implants, gum treatment, root canals, and extractions. Preventive care is also provided. Treatment is provided to both adults and children. Patients are selected via a lottery and there is often a waiting list.   [facility] [address] [place]  [phone] [email or web address]   [facility] [address] [place], [place] [phone], Ext. [phone] Second and Fourth Thursday of each month, opens at 6:30 AM; Clinic ends at 9 AM.  Patients are seen on a first-come first-served basis, and a limited number are seen during each clinic.   [facility]  [address] Ether Griffins [place], [place] [phone]   Eligibility Requirements You must have lived in [place], [place], or [place] counties for at least the last three months.   You cannot be eligible for state or federal sponsored [place], including [employer], [place], or [employer].   You generally cannot be eligible for healthcare insurance through your employer.    How to apply: Eligibility screenings are held every Tuesday and Wednesday afternoon from 1:00 pm until 4:00 pm. You do not need an appointment for the interview!  [facility] [address] [place], [place] [phone]   [facility] Health Department  [phone]   [facility] Health Department  [phone]   [facility] Health Department  [phone]    Behavioral Health Resources in the Community: Intensive Outpatient Programs Organization         Address  Phone  Notes  [facility] [address], [place], [place] [phone]   [facility] Outpatient [address] [place], [place] [phone]   [facility] [address] [place], [place]  [phone]   [facility] Health [address]  [place], [place] [phone] or [phone]   Substance Abuse Resources Organization  Address  Phone  Notes  [facility] Drug [facility]  [phone]   [facility]  [phone]   [facility] [place]  [phone]   Floydene Flock  [phone]   Residential & Outpatient Substance Abuse Program  [phone]   Psychological Services Organization         Address  Phone  Notes  [facility] Behavioral Health  [phone]   [facility] Services  [phone]   [facility] Mental Health [address], [place] [phone] or [phone]    Mobile Crisis Teams Organization         Address  Phone  Notes  [facility], Mobile Crisis Care Unit  [phone]   [facility]  [address] [place], [place] [phone]   Doristine Locks [address] [place] [phone]    Self-Help/Support Groups Organization         Address  Phone             Notes  [facility] [place] - variety of support groups  336- I7437963 Call for more information  Narcotics Anonymous (NA), [facility] [address], [employer] [place]  2 meetings at this location   Statistician         Address  Phone  Notes  [facility] [address],    [place]  [phone]   [facility]  [address] [place] [address], [place], [place] [phone]   [facility] [address] [place], [place] [phone] Admissions: 8am-3pm M-F  [facility] [address],    [place], [place] [phone]   [facility] [address] [place], [place], [place] [phone]   [facility] [address],  [place], [place] [phone]   [facility] - Intensive Outpatient [address] Laurell Joseph[address], [place], [place] [phone]   [facility] ([facility] [address] [place]  [place], [place] [phone] or [phone]   [facility] ([facility]) [address] [place], [place] [phone] Accepts Medicaid  [facility] [address],  [place] [phone] Substance Abuse/Addiction Treatment   [facility] Organization         Address  Phone  Notes  [facility]  [phone]   Angie Fava, PhD [address] Ervin Knack [place], [place]   [phone] or [phone]   [facility] Behavioral   [address] [place], [place] [phone]   [facility] [address] [place], [place] [phone] Insurance/Medicaid/sponsorship through [facility] and Families [address]                                    [place], [place] [phone] Therapy/tele-psych/case  [facility] [address], [place] [phone]    Dr. Lolly Mustache  [phone]   [facility]  [employer] [facility] Dept. 1) [address] [place] 2) [address], [place] 3)  [address], [place] [phone]  [phone]   [facility] Child Abuse Hotline (541)357-8018 or [phone] (After Hours)

## 2014-07-03 NOTE — ED Notes (Signed)
Patient states she is out of her cymbalta, buspar, klonopin.

## 2014-07-03 NOTE — ED Notes (Signed)
EMS gave patient 324 mg ASA PTA

## 2014-07-03 NOTE — ED Notes (Signed)
Patient arrives via EMS from home with c/o mid chest pressure that started today. H/o anxiety and has not had medication in 2 weeks. States recent stressors in life and EMS reports patient tearful during transport. Arrives a/o x 4

## 2014-07-03 NOTE — ED Provider Notes (Signed)
CSN: [id number]     Arrival date & time 07/03/14  1420 History  This chart was scribed for Gilda Crease, * by Ronney Lion, ED Scribe. This patient was seen in room APA16A/APA16A and the patient's care was started at 2:24 PM.    Chief Complaint  Patient presents with  . Chest Pain   The history is provided by the patient. No language interpreter was used.     HPI Comments: Melissa Butler is a 38 y.o. female who presents to the Emergency Department complaining of center chest pain that began a week ago. Patient complains of associated abdominal pain, diarrhea, nausea, and dyspnea. She states that spicy food exacerbates her pain. Patient denies vomiting and cough, as well as any chronic abdominal disease.   Past Medical History  Diagnosis Date  . Diabetes mellitus without complication   . Hypertension   . Anxiety    Past Surgical History  Procedure Laterality Date  . Cesarean section     No family history on file. History  Substance Use Topics  . Smoking status: Current Some Day Smoker -- 0.50 packs/day  . Smokeless tobacco: Not on file  . Alcohol Use: No   OB History    No data available     Review of Systems  Gastrointestinal: Positive for diarrhea.  All other systems reviewed and are negative.     Allergies  Ciprofloxacin; Penicillins; Amoxicillin; Codeine; Metronidazole; and Sulfa antibiotics  Home Medications   Prior to Admission medications   Medication Sig Start Date End Date Taking? Authorizing Provider  DULoxetine (CYMBALTA) 60 MG capsule Take 60 mg by mouth daily.   Yes Historical Provider, MD  lisinopril (PRINIVIL,ZESTRIL) 10 MG tablet Take 1 tablet (10 mg total) by mouth daily. 07/01/14  Yes Vida Roller, MD  medroxyPROGESTERone (DEPO-PROVERA) 150 MG/ML injection Inject 150 mg into the muscle every 3 (three) months.   Yes Historical Provider, MD  pantoprazole (PROTONIX) 20 MG tablet Take 1 tablet (20 mg total) by mouth daily. 05/31/14  Yes  Gilda Crease, MD  ranitidine (ZANTAC) 150 MG tablet Take 150 mg by mouth daily.   Yes Historical Provider, MD  Dextromethorphan-Guaifenesin (MUCINEX FAST-MAX DM MAX) 5-100 MG/5ML LIQD Take 20 mg by mouth every 4 (four) hours as needed (Chest Congestion).    Historical Provider, MD  oxyCODONE-acetaminophen (PERCOCET) 5-325 MG per tablet Take 2 tablets by mouth every 4 (four) hours as needed. Patient not taking: Reported on 07/03/2014 05/31/14   Gilda Crease, MD  oxyCODONE-acetaminophen (PERCOCET) 5-325 MG per tablet Take 2 tablets by mouth every 4 (four) hours as needed. Patient not taking: Reported on 07/03/2014 05/31/14   Gilda Crease, MD  promethazine (PHENERGAN) 25 MG tablet Take 1 tablet (25 mg total) by mouth every 6 (six) hours as needed for nausea or vomiting. Patient not taking: Reported on 07/03/2014 05/31/14   Gilda Crease, MD  sucralfate (CARAFATE) 1 GM/10ML suspension Take 10 mLs (1 g total) by mouth 4 (four) times daily -  with meals and at bedtime. Patient not taking: Reported on 07/03/2014 05/31/14   Gilda Crease, MD   BP 143/109 mmHg  Pulse 105  Temp(Src) 98.7 F (37.1 C) (Oral)  Resp 19  Ht 5\' 3"  (1.6 m)  Wt 147 lb (66.679 kg)  BMI 26.05 kg/m2  SpO2 100%  LMP  Physical Exam  Constitutional: She is oriented to person, place, and time. She appears well-developed and well-nourished. No distress.  HENT:  Head: Normocephalic  and atraumatic.  Right Ear: Hearing normal.  Left Ear: Hearing normal.  Nose: Nose normal.  Mouth/Throat: Oropharynx is clear and moist and mucous membranes are normal.  Eyes: Conjunctivae and EOM are normal. Pupils are equal, round, and reactive to light.  Neck: Normal range of motion. Neck supple.  Cardiovascular: Regular rhythm, S1 normal and S2 normal.  Exam reveals no gallop and no friction rub.   No murmur heard. Pulmonary/Chest: Effort normal and breath sounds normal. No respiratory distress. She  exhibits no tenderness.  Abdominal: Soft. Normal appearance and bowel sounds are normal. There is no hepatosplenomegaly. There is no tenderness. There is no rebound, no guarding, no tenderness at McBurney's point and negative Murphy's sign. No hernia.  Musculoskeletal: Normal range of motion.  Neurological: She is alert and oriented to person, place, and time. She has normal strength. No cranial nerve deficit or sensory deficit. Coordination normal. GCS eye subscore is 4. GCS verbal subscore is 5. GCS motor subscore is 6.  Skin: Skin is warm, dry and intact. No rash noted. No cyanosis.  Psychiatric: She has a normal mood and affect. Her speech is normal and behavior is normal. Thought content normal.  Nursing note and vitals reviewed.   ED Course  Procedures (including critical care time)  DIAGNOSTIC STUDIES: Oxygen Saturation is 100% on room air, normal by my interpretation.    COORDINATION OF CARE: 2:27 PM - Discussed treatment plan with pt at bedside which includes XR and EKG and pt agreed to plan.   Labs Review Labs Reviewed  CBC WITH DIFFERENTIAL - Abnormal; Notable for the following:    WBC 11.7 (*)    RBC 5.26 (*)    Hemoglobin 17.1 (*)    HCT 50.7 (*)    Neutrophils Relative % 83 (*)    Neutro Abs 9.6 (*)    All other components within normal limits  COMPREHENSIVE METABOLIC PANEL - Abnormal; Notable for the following:    BUN 4 (*)    All other components within normal limits  URINALYSIS, ROUTINE W REFLEX MICROSCOPIC - Abnormal; Notable for the following:    Specific Gravity, Urine <1.005 (*)    All other components within normal limits  LIPASE, BLOOD  TROPONIN I  POC URINE PREG, ED    Imaging Review Ct Angio Chest Pe W/cm &/or Wo Cm  07/03/2014   CLINICAL DATA:  Shortness of breath. Chest pain. Clinical suspicion for pulmonary embolism.  EXAM: CT ANGIOGRAPHY CHEST WITH CONTRAST  TECHNIQUE: Multidetector CT imaging of the chest was performed using the standard  protocol during bolus administration of intravenous contrast. Multiplanar CT image reconstructions and MIPs were obtained to evaluate the vascular anatomy.  CONTRAST:  100mL OMNIPAQUE IOHEXOL 350 MG/ML SOLN  COMPARISON:  None.  FINDINGS: Vascular/Cardiac: Opacification of pulmonary arteries is suboptimal, however no definite central pulmonary emboli visualized. No evidence of thoracic aortic aneurysm or dissection.  Mediastinum/Hilar Regions: No masses or pathologically enlarged lymph nodes identified.  Lungs: No pulmonary infiltrate or mass identified. Mild emphysema noted.  Pleura:  No evidence of effusion or mass.  Musculoskeletal:  No suspicious bone lesions identified.  Other:  None.  Review of the MIP images confirms the above findings.  IMPRESSION: Suboptimal opacification of pulmonary arteries, however no definite central pulmonary emboli visualized.  Mild emphysema.  No active lung disease.   Electronically Signed   By: Myles RosenthalJohn  Stahl M.D.   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 Patient is experiencing chest pain and abdominal pain with associated diarrhea. In addition to this, patient is experiencing severe anxiety. Patient reports being off of her medication for anxiety for approximately 2 weeks.  Abdominal exam is benign. Patient indicates that the pain is in the epigastric and low chest area. She does not have tenderness in these regions. There is no  tenderness in the right upper quadrant. No lower abdominal or pelvic pain or symptoms.  She does have a history of hypertension, has also been off of her blood pressure medications were. Time. Based on this, patient had CT angiography to rule out PE and aortic dissection as a cause of her symptoms. CT scan did not show any acute pathology. Patient administered analgesia and Ativan here in the ER. She is reassured. She will need to follow-up with mental health for management of her chronic anxiety.  I personally performed the services described in this documentation, which was scribed in my presence. The recorded information has been reviewed and is accurate.     Gilda Crease, MD 07/03/14 [phone]

## 2014-09-10 ENCOUNTER — Other Ambulatory Visit (HOSPITAL_COMMUNITY): Payer: Self-pay | Admitting: Nurse Practitioner

## 2014-09-10 DIAGNOSIS — N6452 Nipple discharge: Secondary | ICD-10-CM

## 2014-09-23 ENCOUNTER — Encounter (HOSPITAL_COMMUNITY): Payer: Self-pay

## 2014-10-14 ENCOUNTER — Encounter (HOSPITAL_COMMUNITY): Payer: Self-pay | Admitting: *Deleted

## 2014-10-14 ENCOUNTER — Emergency Department (HOSPITAL_COMMUNITY)
Admission: EM | Admit: 2014-10-14 | Discharge: 2014-10-14 | Disposition: A | Discharge status: Home / Self Care | Payer: Self-pay | Attending: Emergency Medicine | Admitting: Emergency Medicine

## 2014-10-14 DIAGNOSIS — Z79899 Other long term (current) drug therapy: Secondary | ICD-10-CM | POA: Insufficient documentation

## 2014-10-14 DIAGNOSIS — Z880 Allergy status to penicillin: Secondary | ICD-10-CM | POA: Insufficient documentation

## 2014-10-14 DIAGNOSIS — E119 Type 2 diabetes mellitus without complications: Secondary | ICD-10-CM | POA: Insufficient documentation

## 2014-10-14 DIAGNOSIS — K047 Periapical abscess without sinus: Secondary | ICD-10-CM | POA: Insufficient documentation

## 2014-10-14 DIAGNOSIS — I10 Essential (primary) hypertension: Secondary | ICD-10-CM | POA: Insufficient documentation

## 2014-10-14 DIAGNOSIS — F419 Anxiety disorder, unspecified: Secondary | ICD-10-CM | POA: Insufficient documentation

## 2014-10-14 DIAGNOSIS — Z720 Tobacco use: Secondary | ICD-10-CM | POA: Insufficient documentation

## 2014-10-14 LAB — CBG MONITORING, ED: Glucose-Capillary: 156 mg/dL — ABNORMAL HIGH (ref 70–99)

## 2014-10-14 MED ORDER — CLINDAMYCIN HCL 150 MG PO CAPS: 150.0000 mg | ORAL_CAPSULE | Freq: Four times a day (QID) | ORAL | Status: DC

## 2014-10-14 MED ORDER — HYDROCODONE-ACETAMINOPHEN 5-325 MG PO TABS: 1.0000 | ORAL_TABLET | ORAL | Status: DC | PRN

## 2014-10-14 NOTE — ED Notes (Signed)
Pain rt upper tooth, x 1 week, worse today.

## 2014-10-14 NOTE — Discharge Instructions (Signed)
Dental Abscess °A dental abscess is a collection of infected fluid (pus) from a bacterial infection in the inner part of the tooth (pulp). It usually occurs at the end of the tooth's root.  °CAUSES  °· Severe tooth decay. °· Trauma to the tooth that allows bacteria to enter into the pulp, such as a broken or chipped tooth. °SYMPTOMS  °· Severe pain in and around the infected tooth. °· Swelling and redness around the abscessed tooth or in the mouth or face. °· Tenderness. °· Pus drainage. °· Bad breath. °· Bitter taste in the mouth. °· Difficulty swallowing. °· Difficulty opening the mouth. °· Nausea. °· Vomiting. °· Chills. °· Swollen neck glands. °DIAGNOSIS  °· A medical and dental history will be taken. °· An examination will be performed by tapping on the abscessed tooth. °· X-rays may be taken of the tooth to identify the abscess. °TREATMENT °The goal of treatment is to eliminate the infection. You may be prescribed antibiotic medicine to stop the infection from spreading. A root canal may be performed to save the tooth. If the tooth cannot be saved, it may be pulled (extracted) and the abscess may be drained.  °HOME CARE INSTRUCTIONS °· Only take over-the-counter or prescription medicines for pain, fever, or discomfort as directed by your caregiver. °· Rinse your mouth (gargle) often with salt water (¼ tsp salt in 8 oz [250 ml] of warm water) to relieve pain or swelling. °· Do not drive after taking pain medicine (narcotics). °· Do not apply heat to the outside of your face. °· Return to your dentist for further treatment as directed. °SEEK MEDICAL CARE IF: °· Your pain is not helped by medicine. °· Your pain is getting worse instead of better. °SEEK IMMEDIATE MEDICAL CARE IF: °· You have a fever or persistent symptoms for more than 2-3 days. °· You have a fever and your symptoms suddenly get worse. °· You have chills or a very bad headache. °· You have problems breathing or swallowing. °· You have trouble  opening your mouth. °· You have swelling in the neck or around the eye. °Document Released: 07/11/2005 Document Revised: 04/04/2012 Document Reviewed: 10/19/2010 °[employer]® Patient Information ©2015 [employer]. This information is not intended to replace advice given to you by your health care provider. Make sure you discuss any questions you have with your health care provider. ° ° °Complete your entire course of antibiotics as prescribed.  You  may use the hydrocodone for pain relief but do not drive within 4 hours of taking as this will make you drowsy.  Avoid applying heat or ice to this abscess area which can worsen your symptoms.  You may use warm salt water swish and spit treatment or half peroxide and water swish and spit after meals to keep this area clean as discussed.  Call the dentist listed above for further management of your symptoms. ° ° °

## 2014-10-15 NOTE — ED Provider Notes (Signed)
CSN: [id number]     Arrival date & time 10/14/14  1427 History   First MD Initiated Contact with Patient 10/14/14 1449     Chief Complaint  Patient presents with  . Dental Pain     (Consider location/radiation/quality/duration/timing/severity/associated sxs/prior Treatment) Patient is a 39 y.o. female presenting with tooth pain. The history is provided by the patient.  Dental Pain Associated symptoms: no facial swelling, no fever and no neck pain    Melissa Butler is a 39 y.o. female one week history of dental pain and gingival swelling.   The patient has recently fractured the tooth and filling of her right upper 1st molar tooth which has now started to cause increased  Pain along with gingival swelling and drainage with a bad taste from around the tooth.  There has been no fevers, chills, nausea or vomiting, also no complaint of difficulty swallowing, although chewing makes pain worse.  The patient has tried oragel, naproxen and tylenol without relief of symptoms.  She is currently attempting to obtain an appointment with the [facility] dental clinic.       Past Medical History  Diagnosis Date  . Diabetes mellitus without complication   . Hypertension   . Anxiety    Past Surgical History  Procedure Laterality Date  . Cesarean section     History reviewed. No pertinent family history. History  Substance Use Topics  . Smoking status: Current Some Day Smoker -- 0.50 packs/day    Types: Cigarettes  . Smokeless tobacco: Not on file  . Alcohol Use: No   OB History    No data available     Review of Systems  Constitutional: Negative for fever.  HENT: Positive for dental problem. Negative for facial swelling and sore throat.   Respiratory: Negative for shortness of breath.   Musculoskeletal: Negative for neck pain and neck stiffness.      Allergies  Ciprofloxacin; Penicillins; Amoxicillin; Codeine; Metronidazole; and Sulfa antibiotics  Home Medications   Prior to  Admission medications   Medication Sig Start Date End Date Taking? Authorizing Provider  clindamycin (CLEOCIN) 150 MG capsule Take 1 capsule (150 mg total) by mouth every 6 (six) hours. 10/14/14   Burgess Amor, PA-C  Dextromethorphan-Guaifenesin (MUCINEX FAST-MAX DM MAX) 5-100 MG/5ML LIQD Take 20 mg by mouth every 4 (four) hours as needed (Chest Congestion).    Historical Provider, MD  DULoxetine (CYMBALTA) 60 MG capsule Take 60 mg by mouth daily.    Historical Provider, MD  HYDROcodone-acetaminophen (NORCO/VICODIN) 5-325 MG per tablet Take 1 tablet by mouth every 4 (four) hours as needed. 10/14/14   Burgess Amor, PA-C  lisinopril (PRINIVIL,ZESTRIL) 10 MG tablet Take 1 tablet (10 mg total) by mouth daily. 07/01/14   Eber Hong, MD  LORazepam (ATIVAN) 1 MG tablet Take 1 tablet (1 mg total) by mouth every 8 (eight) hours as needed for anxiety. 07/03/14   Gilda Crease, MD  medroxyPROGESTERone (DEPO-PROVERA) 150 MG/ML injection Inject 150 mg into the muscle every 3 (three) months.    Historical Provider, MD  oxyCODONE-acetaminophen (PERCOCET) 5-325 MG per tablet Take 2 tablets by mouth every 4 (four) hours as needed. Patient not taking: Reported on 07/03/2014 05/31/14   Gilda Crease, MD  oxyCODONE-acetaminophen (PERCOCET) 5-325 MG per tablet Take 2 tablets by mouth every 4 (four) hours as needed. Patient not taking: Reported on 07/03/2014 05/31/14   Gilda Crease, MD  pantoprazole (PROTONIX) 20 MG tablet Take 1 tablet (20 mg total) by mouth  daily. 05/31/14   Gilda Crease, MD  promethazine (PHENERGAN) 25 MG tablet Take 1 tablet (25 mg total) by mouth every 6 (six) hours as needed for nausea or vomiting. Patient not taking: Reported on 07/03/2014 05/31/14   Gilda Crease, MD  ranitidine (ZANTAC) 150 MG tablet Take 150 mg by mouth daily.    Historical Provider, MD  sucralfate (CARAFATE) 1 GM/10ML suspension Take 10 mLs (1 g total) by mouth 4 (four) times daily -  with  meals and at bedtime. Patient not taking: Reported on 07/03/2014 05/31/14   Gilda Crease, MD   BP 121/78 mmHg  Pulse 94  Temp(Src) 98.4 F (36.9 C) (Oral)  Resp 16  SpO2 100% Physical Exam  Constitutional: She is oriented to person, place, and time. She appears well-developed and well-nourished. No distress.  HENT:  Head: Normocephalic and atraumatic.  Right Ear: Tympanic membrane and external ear normal.  Left Ear: Tympanic membrane and external ear normal.  Mouth/Throat: Oropharynx is clear and moist and mucous membranes are normal. No oral lesions. No trismus in the jaw. Dental abscesses present.    Eyes: Conjunctivae are normal.  Neck: Normal range of motion. Neck supple.  Cardiovascular: Normal rate and normal heart sounds.   Pulmonary/Chest: Effort normal.  Abdominal: She exhibits no distension.  Musculoskeletal: Normal range of motion.  Lymphadenopathy:    She has no cervical adenopathy.  Neurological: She is alert and oriented to person, place, and time.  Skin: Skin is warm and dry. No erythema.  Psychiatric: She has a normal mood and affect.    ED Course  Procedures (including critical care time) Labs Review Labs Reviewed  CBG MONITORING, ED - Abnormal; Notable for the following:    Glucose-Capillary 156 (*)    All other components within normal limits    Imaging Review No results found.   EKG Interpretation None      MDM   Final diagnoses:  Dental abscess    Pt with dental abscess. Diabetic with stable cbg. Clindamycin, hydrocodone. Dental referrals given. Advised close f/u.    The patient appears reasonably screened and/or stabilized for discharge and I doubt any other medical condition or other [facility] requiring further screening, evaluation, or treatment in the ED at this time prior to discharge.     Burgess Amor, PA-C 10/15/14 1610  Pricilla Loveless, MD 10/18/14 [phone]

## 2014-11-22 ENCOUNTER — Emergency Department (HOSPITAL_COMMUNITY)
Admission: EM | Admit: 2014-11-22 | Discharge: 2014-11-22 | Disposition: A | Discharge status: Home / Self Care | Payer: Self-pay | Attending: Emergency Medicine | Admitting: Emergency Medicine

## 2014-11-22 ENCOUNTER — Encounter (HOSPITAL_COMMUNITY): Payer: Self-pay | Admitting: Emergency Medicine

## 2014-11-22 DIAGNOSIS — Z79899 Other long term (current) drug therapy: Secondary | ICD-10-CM | POA: Insufficient documentation

## 2014-11-22 DIAGNOSIS — Z720 Tobacco use: Secondary | ICD-10-CM | POA: Insufficient documentation

## 2014-11-22 DIAGNOSIS — K0889 Other specified disorders of teeth and supporting structures: Secondary | ICD-10-CM

## 2014-11-22 DIAGNOSIS — K029 Dental caries, unspecified: Secondary | ICD-10-CM | POA: Insufficient documentation

## 2014-11-22 DIAGNOSIS — I10 Essential (primary) hypertension: Secondary | ICD-10-CM | POA: Insufficient documentation

## 2014-11-22 DIAGNOSIS — F32A Depression, unspecified: Secondary | ICD-10-CM

## 2014-11-22 DIAGNOSIS — Z880 Allergy status to penicillin: Secondary | ICD-10-CM | POA: Insufficient documentation

## 2014-11-22 DIAGNOSIS — F329 Major depressive disorder, single episode, unspecified: Secondary | ICD-10-CM | POA: Insufficient documentation

## 2014-11-22 DIAGNOSIS — K088 Other specified disorders of teeth and supporting structures: Secondary | ICD-10-CM | POA: Insufficient documentation

## 2014-11-22 DIAGNOSIS — E119 Type 2 diabetes mellitus without complications: Secondary | ICD-10-CM | POA: Insufficient documentation

## 2014-11-22 DIAGNOSIS — F419 Anxiety disorder, unspecified: Secondary | ICD-10-CM | POA: Insufficient documentation

## 2014-11-22 MED ORDER — CLINDAMYCIN HCL 150 MG PO CAPS
ORAL_CAPSULE | ORAL | Status: AC
  Administered 2014-11-22: 300 mg via ORAL
  Filled 2014-11-22: qty 2

## 2014-11-22 MED ORDER — LORAZEPAM 1 MG PO TABS: 1.0000 mg | ORAL_TABLET | Freq: Three times a day (TID) | ORAL | Status: DC | PRN

## 2014-11-22 MED ORDER — LORAZEPAM 1 MG PO TABS
1.0000 mg | ORAL_TABLET | Freq: Once | ORAL | Status: AC
  Administered 2014-11-22: 1 mg via ORAL
  Filled 2014-11-22: qty 1

## 2014-11-22 MED ORDER — CLINDAMYCIN HCL 150 MG PO CAPS
300.0000 mg | ORAL_CAPSULE | Freq: Once | ORAL | Status: AC
  Administered 2014-11-22: 300 mg via ORAL

## 2014-11-22 MED ORDER — IBUPROFEN 800 MG PO TABS
ORAL_TABLET | ORAL | Status: AC
  Administered 2014-11-22: 800 mg via ORAL
  Filled 2014-11-22: qty 1

## 2014-11-22 MED ORDER — IBUPROFEN 800 MG PO TABS
800.0000 mg | ORAL_TABLET | Freq: Once | ORAL | Status: AC
  Administered 2014-11-22: 800 mg via ORAL

## 2014-11-22 NOTE — ED Notes (Signed)
PT c/o right upper dental pain with headache x3 days. PT denies any fever or facial swelling. PT c/o hx of headaches.

## 2014-11-22 NOTE — Discharge Instructions (Signed)
Depression Depression is feeling sad, low, down in the dumps, blue, gloomy, or empty. In general, there are two kinds of depression:  Normal sadness or grief. This can happen after something upsetting. It often goes away on its own within 2 weeks. After losing a loved one (bereavement), normal sadness and grief may last longer than two weeks. It usually gets better with time.  Clinical depression. This kind lasts longer than normal sadness or grief. It keeps you from doing the things you normally do in life. It is often hard to function at home, work, or at school. It may affect your relationships with others. Treatment is often needed. GET HELP RIGHT AWAY IF:  You have thoughts about hurting yourself or others.  You lose touch with reality (psychotic symptoms). You may:  See or hear things that are not real.  Have untrue beliefs about your life or people around you.  Your medicine is giving you problems. MAKE SURE YOU:  Understand these instructions.  Will watch your condition.  Will get help right away if you are not doing well or get worse. Document Released: 08/13/2010 Document Revised: 11/25/2013 Document Reviewed: 11/10/2011 [facility] Patient Information 2015 [place], [place]. This information is not intended to replace advice given to you by your health care provider. Make sure you discuss any questions you have with your health care provider.  Dental Pain Toothache is pain in or around a tooth. It may get worse with chewing or with cold or heat.  HOME CARE  Your dentist may use a numbing medicine during treatment. If so, you may need to avoid eating until the medicine wears off. Ask your dentist about this.  Only take medicine as told by your dentist or doctor.  Avoid chewing food near the painful tooth until after all treatment is done. Ask your dentist about this. GET HELP RIGHT AWAY IF:   The problem gets worse or new problems appear.  You have a fever.  There is  redness and puffiness (swelling) of the face, jaw, or neck.  You cannot open your mouth.  There is pain in the jaw.  There is very bad pain that is not helped by medicine. MAKE SURE YOU:   Understand these instructions.  Will watch your condition.  Will get help right away if you are not doing well or get worse. Document Released: 12/28/2007 Document Revised: 10/03/2011 Document Reviewed: 12/28/2007 [facility] Patient Information 2015 [place], [place]. This information is not intended to replace advice given to you by your health care provider. Make sure you discuss any questions you have with your health care provider.

## 2014-11-22 NOTE — ED Provider Notes (Signed)
CSN: [id number]     Arrival date & time 11/22/14  1006 History   First MD Initiated Contact with Patient 11/22/14 1227     Chief Complaint  Patient presents with  . Dental Pain     (Consider location/radiation/quality/duration/timing/severity/associated sxs/prior Treatment) HPI Comments: Patient states she is also here in the emergency department because she "didn't feel like she can make it through what she is going through".  The patient states that the mother has a drinking problem. Patient states that when her mother drinks heavily she feels as though either her mother will not wake up, or that she (the patient" )will not make it. The patient states she has been treated for depression for a couple of years with Cymbalta. She has not had any active care or treatment concerning this problem. The patient denies use of any drugs or alcohol. States that she smokes cigarettes. She denies suicidal or homicidal ideations. She denies any plan or suicidal or homicidal plan. She states is never attempted any suicidal acts. She requests assistance with this problem as well as her tooth.  Patient is a 39 y.o. female presenting with tooth pain. The history is provided by the patient.  Dental Pain Location:  Upper Severity:  Severe Onset quality:  Gradual Duration:  3 days Timing:  Intermittent Progression:  Worsening Chronicity:  Chronic Context: dental caries and poor dentition   Relieved by:  Nothing Worsened by:  Nothing tried Associated symptoms: gum swelling and headaches   Associated symptoms: no fever and no trismus   Risk factors: diabetes and smoking     Past Medical History  Diagnosis Date  . Diabetes mellitus without complication   . Hypertension   . Anxiety    Past Surgical History  Procedure Laterality Date  . Cesarean section     History reviewed. No pertinent family history. History  Substance Use Topics  . Smoking status: Current Some Day Smoker -- 0.50 packs/day   Types: Cigarettes  . Smokeless tobacco: Not on file  . Alcohol Use: No   OB History    Gravida Para Term Preterm AB TAB SAB Ectopic Multiple Living            2     Review of Systems  Constitutional: Negative for fever.  HENT: Positive for dental problem.   Neurological: Positive for headaches.  Psychiatric/Behavioral: Negative for suicidal ideas and hallucinations. The patient is nervous/anxious.        Depression  All other systems reviewed and are negative.     Allergies  Ciprofloxacin; Penicillins; Amoxicillin; Codeine; Metronidazole; and Sulfa antibiotics  Home Medications   Prior to Admission medications   Medication Sig Start Date End Date Taking? Authorizing Provider  clindamycin (CLEOCIN) 150 MG capsule Take 1 capsule (150 mg total) by mouth every 6 (six) hours. 10/14/14   Burgess Amor, PA-C  Dextromethorphan-Guaifenesin (MUCINEX FAST-MAX DM MAX) 5-100 MG/5ML LIQD Take 20 mg by mouth every 4 (four) hours as needed (Chest Congestion).    Historical Provider, MD  DULoxetine (CYMBALTA) 60 MG capsule Take 60 mg by mouth daily.    Historical Provider, MD  HYDROcodone-acetaminophen (NORCO/VICODIN) 5-325 MG per tablet Take 1 tablet by mouth every 4 (four) hours as needed. 10/14/14   Burgess Amor, PA-C  lisinopril (PRINIVIL,ZESTRIL) 10 MG tablet Take 1 tablet (10 mg total) by mouth daily. 07/01/14   Eber Hong, MD  LORazepam (ATIVAN) 1 MG tablet Take 1 tablet (1 mg total) by mouth every 8 (eight) hours as  needed for anxiety. 07/03/14   Gilda Crease, MD  medroxyPROGESTERone (DEPO-PROVERA) 150 MG/ML injection Inject 150 mg into the muscle every 3 (three) months.    Historical Provider, MD  oxyCODONE-acetaminophen (PERCOCET) 5-325 MG per tablet Take 2 tablets by mouth every 4 (four) hours as needed. Patient not taking: Reported on 07/03/2014 05/31/14   Gilda Crease, MD  oxyCODONE-acetaminophen (PERCOCET) 5-325 MG per tablet Take 2 tablets by mouth every 4 (four) hours as  needed. Patient not taking: Reported on 07/03/2014 05/31/14   Gilda Crease, MD  pantoprazole (PROTONIX) 20 MG tablet Take 1 tablet (20 mg total) by mouth daily. 05/31/14   Gilda Crease, MD  promethazine (PHENERGAN) 25 MG tablet Take 1 tablet (25 mg total) by mouth every 6 (six) hours as needed for nausea or vomiting. Patient not taking: Reported on 07/03/2014 05/31/14   Gilda Crease, MD  ranitidine (ZANTAC) 150 MG tablet Take 150 mg by mouth daily.    Historical Provider, MD  sucralfate (CARAFATE) 1 GM/10ML suspension Take 10 mLs (1 g total) by mouth 4 (four) times daily -  with meals and at bedtime. Patient not taking: Reported on 07/03/2014 05/31/14   Gilda Crease, MD   BP 135/91 mmHg  Pulse 102  Temp(Src) 98.5 F (36.9 C) (Oral)  Resp 18  Ht 5\' 3"  (1.6 m)  Wt 154 lb 2 oz (69.911 kg)  BMI 27.31 kg/m2  SpO2 98% Physical Exam  Constitutional: She is oriented to person, place, and time. She appears well-developed and well-nourished.  Non-toxic appearance.  HENT:  Head: Normocephalic.  Right Ear: Tympanic membrane and external ear normal.  Left Ear: Tympanic membrane and external ear normal.  There are dental caries of the right upper gum area. There is some swelling of the gum. No visible abscess. The airway is patent. There is no swelling under the tongue.  Eyes: EOM and lids are normal. Pupils are equal, round, and reactive to light.  Neck: Normal range of motion. Neck supple. Carotid bruit is not present.  Cardiovascular: Normal rate, regular rhythm, normal heart sounds, intact distal pulses and normal pulses.   Pulmonary/Chest: Breath sounds normal. No respiratory distress.  Abdominal: Soft. Bowel sounds are normal. There is no tenderness. There is no guarding.  Musculoskeletal: Normal range of motion.  Lymphadenopathy:       Head (right side): No submandibular adenopathy present.       Head (left side): No submandibular adenopathy present.    She  has no cervical adenopathy.  Neurological: She is alert and oriented to person, place, and time. She has normal strength. No cranial nerve deficit or sensory deficit.  Skin: Skin is warm and dry.  Psychiatric: She has a normal mood and affect. Her speech is normal.  The patient has a depressed affect. She is tearful at times. She denies suicidal or homicidal ideations. She denies a plan for suicide, and she denies a plan for any homicidal ask. She denies hallucinations both visible and are well.  Nursing note and vitals reviewed.   ED Course  Procedures (including critical care time) Labs Review Labs Reviewed - No data to display  Imaging Review No results found.   EKG Interpretation None      MDM  The vital signs are nonacute.  The patient denies suicidal or homicidal ideations. She denies visible or auditory hallucinations. Her mother is with her, is not drinking at this point, and states that she will assist the patient in  getting the care that is needed. Resource guide for psychiatric care has been given to the patient. Prescription for Ativan is given for the patient to use to the weekend. Prescription for clindamycin and ibuprofen been given for the patient to use for her tooth. She is strongly encouraged to see a dentist as sone as possible.    Final diagnoses:  None    **I have reviewed nursing notes, vital signs, and all appropriate lab and imaging results for this patient.Ivery Quale, PA-C 11/22/14 1347  Derwood Kaplan, MD 11/22/14 [phone]

## 2015-10-24 DIAGNOSIS — I219 Acute myocardial infarction, unspecified: Secondary | ICD-10-CM

## 2015-10-24 HISTORY — DX: Acute myocardial infarction, unspecified: I21.9

## 2015-11-01 ENCOUNTER — Emergency Department (HOSPITAL_COMMUNITY)
Admission: EM | Admit: 2015-11-01 | Discharge: 2015-11-01 | Disposition: A | Discharge status: Home / Self Care | Payer: Medicaid Other | Attending: Emergency Medicine | Admitting: Emergency Medicine

## 2015-11-01 ENCOUNTER — Encounter (HOSPITAL_COMMUNITY): Payer: Self-pay | Admitting: Emergency Medicine

## 2015-11-01 ENCOUNTER — Emergency Department (HOSPITAL_COMMUNITY): Payer: Medicaid Other

## 2015-11-01 DIAGNOSIS — I10 Essential (primary) hypertension: Secondary | ICD-10-CM | POA: Diagnosis not present

## 2015-11-01 DIAGNOSIS — N939 Abnormal uterine and vaginal bleeding, unspecified: Secondary | ICD-10-CM | POA: Diagnosis not present

## 2015-11-01 DIAGNOSIS — R109 Unspecified abdominal pain: Secondary | ICD-10-CM

## 2015-11-01 DIAGNOSIS — K0889 Other specified disorders of teeth and supporting structures: Secondary | ICD-10-CM | POA: Insufficient documentation

## 2015-11-01 DIAGNOSIS — E119 Type 2 diabetes mellitus without complications: Secondary | ICD-10-CM | POA: Diagnosis not present

## 2015-11-01 DIAGNOSIS — F17210 Nicotine dependence, cigarettes, uncomplicated: Secondary | ICD-10-CM | POA: Diagnosis not present

## 2015-11-01 DIAGNOSIS — R1013 Epigastric pain: Secondary | ICD-10-CM | POA: Diagnosis present

## 2015-11-01 DIAGNOSIS — Z79899 Other long term (current) drug therapy: Secondary | ICD-10-CM | POA: Diagnosis not present

## 2015-11-01 LAB — COMPREHENSIVE METABOLIC PANEL
ALT: 29 U/L (ref 14–54)
AST: 29 U/L (ref 15–41)
Albumin: 4.4 g/dL (ref 3.5–5.0)
Alkaline Phosphatase: 87 U/L (ref 38–126)
Anion gap: 8 (ref 5–15)
BUN: 5 mg/dL — AB (ref 6–20)
CHLORIDE: 105 mmol/L (ref 101–111)
CO2: 26 mmol/L (ref 22–32)
CREATININE: 0.71 mg/dL (ref 0.44–1.00)
Calcium: 8.9 mg/dL (ref 8.9–10.3)
GFR calc Af Amer: >60 mL/min (ref >60)
GFR calc non Af Amer: >60 mL/min (ref >60)
GLUCOSE: 97 mg/dL (ref 65–99)
Potassium: 3.6 mmol/L (ref 3.5–5.1)
Sodium: 139 mmol/L (ref 135–145)
Total Bilirubin: 0.4 mg/dL (ref 0.3–1.2)
Total Protein: 7.3 g/dL (ref 6.5–8.1)

## 2015-11-01 LAB — CBC WITH DIFFERENTIAL/PLATELET
BAND NEUTROPHILS: 0 %
BLASTS: 0 %
Basophils Absolute: 0.1 10*3/uL (ref 0.0–0.1)
Basophils Relative: 1 %
EOS ABS: 0.1 10*3/uL (ref 0.0–0.7)
Eosinophils Relative: 1 %
HCT: 48.6 % — ABNORMAL HIGH (ref 36.0–46.0)
Hemoglobin: 16 g/dL — ABNORMAL HIGH (ref 12.0–15.0)
LYMPHS PCT: 20 %
Lymphs Abs: 2.6 10*3/uL (ref 0.7–4.0)
MCH: 30.5 pg (ref 26.0–34.0)
MCHC: 32.9 g/dL (ref 30.0–36.0)
MCV: 92.6 fL (ref 78.0–100.0)
MONOS PCT: 8 %
Metamyelocytes Relative: 0 %
Monocytes Absolute: 1 10*3/uL (ref 0.1–1.0)
Myelocytes: 0 %
NEUTROS ABS: 9.1 10*3/uL — AB (ref 1.7–7.7)
Neutrophils Relative %: 70 %
Other: 0 %
PLATELETS: 265 10*3/uL (ref 150–400)
Promyelocytes Absolute: 0 %
RBC: 5.25 MIL/uL — AB (ref 3.87–5.11)
RDW: 13.4 % (ref 11.5–15.5)
WBC: 12.9 10*3/uL — ABNORMAL HIGH (ref 4.0–10.5)
nRBC: 0 /100 WBC

## 2015-11-01 LAB — POC URINE PREG, ED: Preg Test, Ur: NEGATIVE

## 2015-11-01 LAB — LIPASE, BLOOD: Lipase: 20 U/L (ref 11–51)

## 2015-11-01 MED ORDER — LORAZEPAM 1 MG PO TABS: 1.0000 mg | ORAL_TABLET | Freq: Three times a day (TID) | ORAL | Status: DC | PRN

## 2015-11-01 MED ORDER — PANTOPRAZOLE SODIUM 40 MG IV SOLR
40.0000 mg | Freq: Once | INTRAVENOUS | Status: AC
  Administered 2015-11-01: 40 mg via INTRAVENOUS
  Filled 2015-11-01: qty 40

## 2015-11-01 MED ORDER — LORAZEPAM 2 MG/ML IJ SOLN
1.0000 mg | Freq: Once | INTRAMUSCULAR | Status: AC
  Administered 2015-11-01: 1 mg via INTRAVENOUS
  Filled 2015-11-01: qty 1

## 2015-11-01 MED ORDER — SODIUM CHLORIDE 0.9 % IV BOLUS (SEPSIS)
1000.0000 mL | Freq: Once | INTRAVENOUS | Status: AC
  Administered 2015-11-01: 1000 mL via INTRAVENOUS

## 2015-11-01 MED ORDER — CLINDAMYCIN HCL 150 MG PO CAPS: ORAL_CAPSULE | ORAL | Status: DC

## 2015-11-01 MED ORDER — HYDROCODONE-ACETAMINOPHEN 5-325 MG PO TABS
1.0000 | ORAL_TABLET | Freq: Once | ORAL | Status: AC
  Administered 2015-11-01: 1 via ORAL
  Filled 2015-11-01: qty 1

## 2015-11-01 MED ORDER — RANITIDINE HCL 150 MG PO TABS: 150.0000 mg | ORAL_TABLET | Freq: Two times a day (BID) | ORAL | Status: DC

## 2015-11-01 MED ORDER — TRAMADOL HCL 50 MG PO TABS: 50.0000 mg | ORAL_TABLET | Freq: Four times a day (QID) | ORAL | Status: DC | PRN

## 2015-11-01 NOTE — ED Notes (Signed)
Pt c/o abd pain, anxiety and vaginal bleeding x 1 month.

## 2015-11-01 NOTE — ED Provider Notes (Signed)
CSN: [id number]     Arrival date & time 11/01/15  1744 History   First MD Initiated Contact with Patient 11/01/15 1748     Chief Complaint  Patient presents with  . Abdominal Pain     (Consider location/radiation/quality/duration/timing/severity/associated sxs/prior Treatment) Patient is a 40 y.o. female presenting with abdominal pain. The history is provided by the patient (Patient complains of epigastric abdominal pain and vaginal bleeding. The vaginal bleeding is been going on for a month).  Abdominal Pain Pain location:  Epigastric Pain quality: aching   Pain radiates to:  Does not radiate Pain severity:  Moderate Onset quality:  Sudden Timing:  Constant Progression:  Waxing and waning Chronicity:  New Context: not alcohol use   Associated symptoms: vaginal bleeding   Associated symptoms: no chest pain, no cough, no diarrhea, no fatigue and no hematuria     Past Medical History  Diagnosis Date  . Diabetes mellitus without complication (HCC)   . Hypertension   . Anxiety    Past Surgical History  Procedure Laterality Date  . Cesarean section     No family history on file. Social History  Substance Use Topics  . Smoking status: Current Some Day Smoker -- 0.50 packs/day    Types: Cigarettes  . Smokeless tobacco: None  . Alcohol Use: No   OB History    Gravida Para Term Preterm AB TAB SAB Ectopic Multiple Living            2     Review of Systems  Constitutional: Negative for appetite change and fatigue.  HENT: Positive for dental problem. Negative for congestion, ear discharge and sinus pressure.   Eyes: Negative for discharge.  Respiratory: Negative for cough.   Cardiovascular: Negative for chest pain.  Gastrointestinal: Positive for abdominal pain. Negative for diarrhea.  Genitourinary: Positive for vaginal bleeding. Negative for frequency and hematuria.  Musculoskeletal: Negative for back pain.  Skin: Negative for rash.  Neurological: Negative for seizures  and headaches.  Psychiatric/Behavioral: Negative for hallucinations.      Allergies  Ciprofloxacin; Penicillins; Amoxicillin; Codeine; Metronidazole; and Sulfa antibiotics  Home Medications   Prior to Admission medications   Medication Sig Start Date End Date Taking? Authorizing Provider  DULoxetine (CYMBALTA) 60 MG capsule Take 60 mg by mouth daily.   Yes Historical Provider, MD  metoprolol tartrate (LOPRESSOR) 25 MG tablet Take 25 mg by mouth 2 (two) times daily.   Yes Historical Provider, MD  clindamycin (CLEOCIN) 150 MG capsule Take 2 pills four times a day 11/01/15   Bethann Berkshire, MD  LORazepam (ATIVAN) 1 MG tablet Take 1 tablet (1 mg total) by mouth every 8 (eight) hours as needed for anxiety. 11/01/15   Bethann Berkshire, MD  ranitidine (ZANTAC) 150 MG tablet Take 1 tablet (150 mg total) by mouth 2 (two) times daily. 11/01/15   Bethann Berkshire, MD  traMADol (ULTRAM) 50 MG tablet Take 1 tablet (50 mg total) by mouth every 6 (six) hours as needed. 11/01/15   Bethann Berkshire, MD   BP 142/92 mmHg  Pulse 93  Temp(Src) 98.9 F (37.2 C) (Oral)  Resp 20  Ht 5\' 3"  (1.6 m)  Wt 180 lb (81.647 kg)  BMI 31.89 kg/m2  SpO2 99%  LMP 11/01/2015 Physical Exam  Constitutional: She is oriented to person, place, and time. She appears well-developed.  HENT:  Head: Normocephalic.  Poor dentation.  With tender right upper tooth  Eyes: Conjunctivae and EOM are normal. No scleral icterus.  Neck: Neck  supple. No thyromegaly present.  Cardiovascular: Normal rate and regular rhythm.  Exam reveals no gallop and no friction rub.   No murmur heard. Pulmonary/Chest: No stridor. She has no wheezes. She has no rales. She exhibits no tenderness.  Abdominal: She exhibits no distension. There is tenderness. There is no rebound.  Tender epigastric area  Musculoskeletal: Normal range of motion. She exhibits no edema.  Lymphadenopathy:    She has no cervical adenopathy.  Neurological: She is oriented to person, place,  and time. She exhibits normal muscle tone. Coordination normal.  Skin: No rash noted. No erythema.  Psychiatric: She has a normal mood and affect. Her behavior is normal.    ED Course  Procedures (including critical care time) Labs Review Labs Reviewed  CBC WITH DIFFERENTIAL/PLATELET - Abnormal; Notable for the following:    WBC 12.9 (*)    RBC 5.25 (*)    Hemoglobin 16.0 (*)    HCT 48.6 (*)    Neutro Abs 9.1 (*)    All other components within normal limits  COMPREHENSIVE METABOLIC PANEL - Abnormal; Notable for the following:    BUN 5 (*)    All other components within normal limits  LIPASE, BLOOD  I-STAT BETA HCG BLOOD, ED ([facility], [facility], [facility] ONLY)  POC URINE PREG, ED    Imaging Review Dg Abd Acute W/chest  11/01/2015  CLINICAL DATA:  Abdominal pain, anxiety and vaginal bleeding for 1 month. EXAM: DG ABDOMEN ACUTE W/ 1V CHEST COMPARISON:  05/31/2014 FINDINGS: There is no evidence of dilated bowel loops or free intraperitoneal air. No radiopaque calculi or other significant radiographic abnormality is seen. Heart size and mediastinal contours are within normal limits. Both lungs are clear. IMPRESSION: Negative abdominal radiographs.  No acute cardiopulmonary disease. Electronically Signed   By: Ellery Plunk M.D.   On: 11/01/2015 21:07   I have personally reviewed and evaluated these images and lab results as part of my medical decision-making.   EKG Interpretation None      MDM   Final diagnoses:  Abdominal pain in female patient    Labs and x-rays unremarkable. Patient has an abscessed tooth that she is getting Ultram and Cleocin 4. We will also put her on Zantac for gastritis. She will be referred to a dentist, family doctor and a GYN doctor    Bethann Berkshire, MD 11/01/15 2212

## 2015-11-01 NOTE — Discharge Instructions (Signed)
Follow-up with [facility] in [place] for your stomach discomfort. Follow-up with Dr. Despina Hidden for your vaginal bleeding. Follow-up with a dentist for your tooth

## 2015-11-01 NOTE — ED Notes (Signed)
Patient still asking if she can have pain medication for her stomach. States that she has asked a couple of times.

## 2015-11-01 NOTE — ED Notes (Signed)
Patient walked to restroom

## 2015-11-05 ENCOUNTER — Emergency Department (HOSPITAL_COMMUNITY): Payer: Medicaid Other

## 2015-11-05 ENCOUNTER — Encounter (HOSPITAL_COMMUNITY): Payer: Self-pay | Admitting: Cardiology

## 2015-11-05 ENCOUNTER — Inpatient Hospital Stay (HOSPITAL_COMMUNITY)
Admission: EM | Admit: 2015-11-05 | Discharge: 2015-11-06 | DRG: 282 | Disposition: A | Discharge status: Home / Self Care | Payer: Medicaid Other | Attending: Cardiology | Admitting: Cardiology

## 2015-11-05 DIAGNOSIS — Z888 Allergy status to other drugs, medicaments and biological substances status: Secondary | ICD-10-CM | POA: Diagnosis not present

## 2015-11-05 DIAGNOSIS — F419 Anxiety disorder, unspecified: Secondary | ICD-10-CM | POA: Diagnosis present

## 2015-11-05 DIAGNOSIS — IMO0001 Reserved for inherently not codable concepts without codable children: Secondary | ICD-10-CM

## 2015-11-05 DIAGNOSIS — Z885 Allergy status to narcotic agent status: Secondary | ICD-10-CM

## 2015-11-05 DIAGNOSIS — I10 Essential (primary) hypertension: Secondary | ICD-10-CM | POA: Diagnosis present

## 2015-11-05 DIAGNOSIS — Z0389 Encounter for observation for other suspected diseases and conditions ruled out: Secondary | ICD-10-CM

## 2015-11-05 DIAGNOSIS — Z881 Allergy status to other antibiotic agents status: Secondary | ICD-10-CM | POA: Diagnosis not present

## 2015-11-05 DIAGNOSIS — Z8249 Family history of ischemic heart disease and other diseases of the circulatory system: Secondary | ICD-10-CM

## 2015-11-05 DIAGNOSIS — E119 Type 2 diabetes mellitus without complications: Secondary | ICD-10-CM | POA: Diagnosis present

## 2015-11-05 DIAGNOSIS — I214 Non-ST elevation (NSTEMI) myocardial infarction: Principal | ICD-10-CM | POA: Diagnosis present

## 2015-11-05 DIAGNOSIS — F17210 Nicotine dependence, cigarettes, uncomplicated: Secondary | ICD-10-CM | POA: Diagnosis present

## 2015-11-05 DIAGNOSIS — I201 Angina pectoris with documented spasm: Secondary | ICD-10-CM | POA: Diagnosis present

## 2015-11-05 DIAGNOSIS — Z880 Allergy status to penicillin: Secondary | ICD-10-CM

## 2015-11-05 DIAGNOSIS — R079 Chest pain, unspecified: Secondary | ICD-10-CM

## 2015-11-05 LAB — CBC WITH DIFFERENTIAL/PLATELET
BASOS PCT: 0 %
Basophils Absolute: 0 10*3/uL (ref 0.0–0.1)
EOS ABS: 0.1 10*3/uL (ref 0.0–0.7)
Eosinophils Relative: 1 %
HCT: 48.4 % — ABNORMAL HIGH (ref 36.0–46.0)
HEMOGLOBIN: 15.7 g/dL — AB (ref 12.0–15.0)
LYMPHS PCT: 16 %
Lymphs Abs: 2.3 10*3/uL (ref 0.7–4.0)
MCH: 30.8 pg (ref 26.0–34.0)
MCHC: 32.4 g/dL (ref 30.0–36.0)
MCV: 94.9 fL (ref 78.0–100.0)
MONOS PCT: 7 %
Monocytes Absolute: 1 10*3/uL (ref 0.1–1.0)
NEUTROS ABS: 11.2 10*3/uL — AB (ref 1.7–7.7)
Neutrophils Relative %: 76 %
Platelets: 265 10*3/uL (ref 150–400)
RBC: 5.1 MIL/uL (ref 3.87–5.11)
RDW: 13.5 % (ref 11.5–15.5)
WBC: 14.6 10*3/uL — ABNORMAL HIGH (ref 4.0–10.5)

## 2015-11-05 LAB — APTT: APTT: 29 s (ref 24–37)

## 2015-11-05 LAB — BASIC METABOLIC PANEL
ANION GAP: 7 (ref 5–15)
BUN: <5 mg/dL — ABNORMAL LOW (ref 6–20)
CHLORIDE: 105 mmol/L (ref 101–111)
CO2: 28 mmol/L (ref 22–32)
Calcium: 9.1 mg/dL (ref 8.9–10.3)
Creatinine, Ser: 0.71 mg/dL (ref 0.44–1.00)
GFR calc Af Amer: >60 mL/min (ref >60)
GFR calc non Af Amer: >60 mL/min (ref >60)
GLUCOSE: 96 mg/dL (ref 65–99)
POTASSIUM: 4 mmol/L (ref 3.5–5.1)
Sodium: 140 mmol/L (ref 135–145)

## 2015-11-05 LAB — URINALYSIS, ROUTINE W REFLEX MICROSCOPIC
BILIRUBIN URINE: NEGATIVE
Glucose, UA: NEGATIVE mg/dL
HGB URINE DIPSTICK: NEGATIVE
Ketones, ur: NEGATIVE mg/dL
LEUKOCYTES UA: NEGATIVE
NITRITE: NEGATIVE
PH: 7 (ref 5.0–8.0)
Protein, ur: NEGATIVE mg/dL
SPECIFIC GRAVITY, URINE: 1.01 (ref 1.005–1.030)

## 2015-11-05 LAB — TROPONIN I
TROPONIN I: 0.41 ng/mL — AB (ref <0.031)
Troponin I: 0.44 ng/mL — ABNORMAL HIGH (ref <0.031)

## 2015-11-05 LAB — PROTIME-INR
INR: 1.05 (ref 0.00–1.49)
PROTHROMBIN TIME: 13.9 s (ref 11.6–15.2)

## 2015-11-05 LAB — RAPID URINE DRUG SCREEN, HOSP PERFORMED
Amphetamines: NOT DETECTED
BARBITURATES: NOT DETECTED
Benzodiazepines: POSITIVE — AB
Cocaine: NOT DETECTED
Opiates: NOT DETECTED
TETRAHYDROCANNABINOL: NOT DETECTED

## 2015-11-05 LAB — POC URINE PREG, ED: PREG TEST UR: NEGATIVE

## 2015-11-05 LAB — CBG MONITORING, ED: Glucose-Capillary: 100 mg/dL — ABNORMAL HIGH (ref 65–99)

## 2015-11-05 MED ORDER — NITROGLYCERIN 0.4 MG SL SUBL
0.4000 mg | SUBLINGUAL_TABLET | SUBLINGUAL | Status: DC | PRN
  Administered 2015-11-05 (×2): 0.4 mg via SUBLINGUAL
  Filled 2015-11-05: qty 1

## 2015-11-05 MED ORDER — ONDANSETRON HCL 4 MG/2ML IJ SOLN
4.0000 mg | Freq: Once | INTRAMUSCULAR | Status: AC
  Administered 2015-11-05: 4 mg via INTRAVENOUS

## 2015-11-05 MED ORDER — ASPIRIN 325 MG PO TABS
325.0000 mg | ORAL_TABLET | Freq: Every day | ORAL | Status: DC
  Administered 2015-11-05: 325 mg via ORAL
  Filled 2015-11-05: qty 1

## 2015-11-05 MED ORDER — FENTANYL CITRATE (PF) 100 MCG/2ML IJ SOLN: 50.0000 ug | Freq: Once | INTRAMUSCULAR | Status: DC

## 2015-11-05 MED ORDER — HEPARIN (PORCINE) IN NACL 100-0.45 UNIT/ML-% IJ SOLN
12.0000 [IU]/kg/h | Freq: Once | INTRAMUSCULAR | Status: AC
  Administered 2015-11-05: 12 [IU]/kg/h via INTRAVENOUS
  Filled 2015-11-05: qty 250

## 2015-11-05 MED ORDER — DIAZEPAM 5 MG/ML IJ SOLN
2.5000 mg | Freq: Once | INTRAMUSCULAR | Status: AC
  Administered 2015-11-05: 2.5 mg via INTRAVENOUS
  Filled 2015-11-05: qty 2

## 2015-11-05 MED ORDER — ONDANSETRON HCL 4 MG/2ML IJ SOLN
INTRAMUSCULAR | Status: AC
  Filled 2015-11-05: qty 2

## 2015-11-05 MED ORDER — ONDANSETRON 8 MG PO TBDP
8.0000 mg | ORAL_TABLET | Freq: Once | ORAL | Status: AC
  Administered 2015-11-05: 8 mg via ORAL
  Filled 2015-11-05: qty 1

## 2015-11-05 MED ORDER — TRAMADOL HCL 50 MG PO TABS
50.0000 mg | ORAL_TABLET | Freq: Once | ORAL | Status: AC
  Administered 2015-11-05: 50 mg via ORAL
  Filled 2015-11-05: qty 1

## 2015-11-05 MED ORDER — HEPARIN SODIUM (PORCINE) 5000 UNIT/ML IJ SOLN
4000.0000 [IU] | INTRAMUSCULAR | Status: AC
  Administered 2015-11-05: 4000 [IU] via INTRAVENOUS

## 2015-11-05 MED ORDER — MORPHINE SULFATE (PF) 4 MG/ML IV SOLN
4.0000 mg | Freq: Once | INTRAVENOUS | Status: AC
  Administered 2015-11-05: 4 mg via INTRAVENOUS
  Filled 2015-11-05: qty 1

## 2015-11-05 NOTE — ED Notes (Signed)
Patient assisted with bedside commode.
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
## 2015-11-05 NOTE — ED Notes (Signed)
Hospitalist at bedside 

## 2015-11-05 NOTE — ED Notes (Signed)
Pt c/o blood pressure being high times 1 month.  144/95 with EMS.  Also c/o toothache.

## 2015-11-05 NOTE — H&P (Signed)
History & Physical    Patient ID: Shayma Pfefferle MRN: [id number], DOB/AGE: 38-Sep-1977   Admit date: 11/05/2015   Primary Physician: No PCP Per Patient Primary Cardiologist: None  Patient Profile    40F smoker with DM, HTN, and FH of CAD p/w NSTEMI  Past Medical History    Past Medical History  Diagnosis Date  . Diabetes mellitus without complication ([place])   . Hypertension   . Anxiety     Past Surgical History  Procedure Laterality Date  . Cesarean section       Allergies  Allergies  Allergen Reactions  . Ciprofloxacin Anaphylaxis, Hives and Rash  . Penicillins Anaphylaxis, Hives, Shortness Of Breath, Swelling and Rash    Has patient had a PCN reaction causing immediate rash, facial/tongue/throat swelling, SOB or lightheadedness with hypotension: Yes Has patient had a PCN reaction causing severe rash involving mucus membranes or skin necrosis: No Has patient had a PCN reaction that required hospitalization No Has patient had a PCN reaction occurring within the last 10 years: No If all of the above answers are "NO", then may proceed with Cephalosporin use.   Marland Kitchen Amoxicillin   . Codeine Nausea And Vomiting  . Metronidazole     Other reaction(s): Other - See Comments "All jittery and burning"  . Sulfa Antibiotics     History of Present Illness    40F smoker with DM, HTN, and FH of CAD p/w NSTEMI. She reports a history of chronic CP related to anxiety. Her anxiety is so severe that she is on disability. She reports she had a different type of pain over the past 3 weeks that felt like someone was "sitting on [her] chest."  Pain was intermittent and there were no modifying factors. She had chills and sweats over the past few weeks and some pre-syncope w/o frank syncope. No associated SOB. Denies recent travel, flights, or immobility. She was recently treated for a dental infection. She notes increased stress regarding her 92 year old daughter getting pregnant and married  recently (she is [redacted] weeks pregnant). Her other child is 76. She smokes 1ppd and has for 7 years. Diabetes is diet controlled. The patients mother had an MI at an unknown age and an aunt has an "enlarged heart."  [facility], she was hemodynamically stable 136/93, HR 95, saturating 99% on RA. Labs were notable for K 4.0, Cr 0.71, TnI 0.44, WBC 14.6, hct 48.4, upreg (-), Utox positive for BZD. CXR normal. ECG demonstrated NSR, LAE, LAFB, poor R wave progression. She was given ASA and started on IV UFH and transferred to [facility] for further care.   Home Medications    Prior to Admission medications   Medication Sig Start Date End Date Taking? Authorizing Provider  DULoxetine (CYMBALTA) 60 MG capsule Take 60 mg by mouth daily.   Yes Historical Provider, MD  metoprolol tartrate (LOPRESSOR) 25 MG tablet Take 25 mg by mouth 2 (two) times daily.   Yes Historical Provider, MD  clindamycin (CLEOCIN) 150 MG capsule Take 2 pills four times a day 11/01/15   Milton Ferguson, MD  LORazepam (ATIVAN) 1 MG tablet Take 1 tablet (1 mg total) by mouth every 8 (eight) hours as needed for anxiety. 11/01/15   Milton Ferguson, MD  ranitidine (ZANTAC) 150 MG tablet Take 1 tablet (150 mg total) by mouth 2 (two) times daily. 11/01/15   Milton Ferguson, MD  traMADol (ULTRAM) 50 MG tablet Take 1 tablet (50 mg total) by  mouth every 6 (six) hours as needed. 11/01/15   Milton Ferguson, MD    Family History    History reviewed. No pertinent family history.  Social History    Social History   Social History  . Marital Status: Legally Separated    Spouse Name: N/A  . Number of Children: N/A  . Years of Education: N/A   Occupational History  . Not on file.   Social History Main Topics  . Smoking status: Current Some Day Smoker -- 0.50 packs/day    Types: Cigarettes  . Smokeless tobacco: Not on file  . Alcohol Use: No  . Drug Use: No  . Sexual Activity: Not Currently    Birth Control/ Protection: Implant   Other Topics  Concern  . Not on file   Social History Narrative     Review of Systems    General: See HPI.  Cardiovascular:  + chest pain, rare palps. She reports chronic PND. Dermatological: No rash, lesions/masses Respiratory: No cough, dyspnea Urologic: No hematuria, dysuria Abdominal:   No nausea, vomiting, diarrhea, bright red blood per rectum, melena, or hematemesis Neurologic:  No visual changes, wkns, changes in mental status. All other systems reviewed and are otherwise negative except as noted above.  Physical Exam    Blood pressure 134/95, pulse 79, temperature 98.6 F (37 C), temperature source Oral, resp. rate 18, height _0  (1.6 m), weight 77.111 kg (170 lb), last menstrual period 11/01/2015, SpO2 98 %.  General: Pleasant, NAD Psych: Normal affect. Neuro: Alert and oriented X 3. Moves all extremities spontaneously. HEENT: very poor dentition  Neck: Supple without bruits or JVD. Lungs:  Resp regular and unlabored, CTA. Heart: RRR no s3, s4, or murmurs. Abdomen: Soft, non-tender, non-distended, BS + x 4.  Extremities: No clubbing, cyanosis or edema. DP/PT/Radials 2+ and equal bilaterally.  Labs    Troponin (Point of Care Test) No results for input(s): TROPIPOC in the last 72 hours.  Recent Labs  11/05/15 1741 11/05/15 2206  TROPONINI 0.44* 0.41*   Lab Results  Component Value Date   WBC 14.6* 11/05/2015   HGB 15.7* 11/05/2015   HCT 48.4* 11/05/2015   MCV 94.9 11/05/2015   PLT 265 11/05/2015    Recent Labs Lab 11/01/15 1800 11/05/15 1741  NA 139 140  K 3.6 4.0  CL 105 105  CO2 26 28  BUN 5* <5*  CREATININE 0.71 0.71  CALCIUM 8.9 9.1  PROT 7.3  --   BILITOT 0.4  --   ALKPHOS 87  --   ALT 29  --   AST 29  --   GLUCOSE 97 96   No results found for: CHOL, HDL, LDLCALC, TRIG No results found for: [facility]   Radiology Studies    Dg Chest 2 View  11/05/2015  CLINICAL DATA:  Elevated blood pressure for 1 month, some chest pain, shortness of breath,  productive cough, fever and nausea for 1 week, hypertension, diabetes mellitus, smoker EXAM: CHEST  2 VIEW COMPARISON:  11/01/2015 FINDINGS: Normal heart size, mediastinal contours, and pulmonary vascularity. Lungs clear. No pleural effusion or pneumothorax. Bones unremarkable. IMPRESSION: No acute abnormalities. Electronically Signed   By: Lavonia Dana M.D.   On: 11/05/2015 19:05   Dg Abd Acute W/chest  11/01/2015  CLINICAL DATA:  Abdominal pain, anxiety and vaginal bleeding for 1 month. EXAM: DG ABDOMEN ACUTE W/ 1V CHEST COMPARISON:  05/31/2014 FINDINGS: There is no evidence of dilated bowel loops or free intraperitoneal air. No radiopaque calculi  or other significant radiographic abnormality is seen. Heart size and mediastinal contours are within normal limits. Both lungs are clear. IMPRESSION: Negative abdominal radiographs.  No acute cardiopulmonary disease. Electronically Signed   By: Andreas Newport M.D.   On: 11/01/2015 21:07    ECG & Cardiac Imaging    NSR, LAE, LAFB, poor R wave progression.   Assessment & Plan    53F smoker with DM, HTN, and FH of CAD p/w NSTEMI.  NSTEMI. Although the patient is young, she has several risk factors for CAD, including smoking status, DM, HTN, and FH of MI. Other potential etiologies include myocarditis, stress cardiomyopathy, PE, or SCAD. She will require angiography to define her anatomy.  - ASA - metoprolol 26m BID - atorvastatin 825m- UFH - TTE - A1c, lipids - ESR, CRP (which would be potentially elevated with myocarditis)  - Smoking cessation counselling  - Nicotine patch - Cardiac Rehab - NPO s/p MN for cardiac cath  Signed, FRLamar SprinklesMD 11/05/2015, 11:25 PM

## 2015-11-05 NOTE — ED Notes (Signed)
Pt reports that she moved to [facility] from [place]. She has no PCP. She has HX of HTN and anxiety in which she no longer has any meds. States it has been a long time without medication. She reports dealing with a lot going on. As well as dental pain in right lower molar. Noted to have poor dentition with missing and decayed teeth

## 2015-11-06 ENCOUNTER — Inpatient Hospital Stay (HOSPITAL_COMMUNITY): Payer: Medicaid Other

## 2015-11-06 ENCOUNTER — Encounter (HOSPITAL_COMMUNITY)
Admission: EM | Disposition: A | Discharge status: Home / Self Care | Payer: Self-pay | Source: Home / Self Care | Attending: Cardiology

## 2015-11-06 DIAGNOSIS — Z0389 Encounter for observation for other suspected diseases and conditions ruled out: Secondary | ICD-10-CM

## 2015-11-06 DIAGNOSIS — I214 Non-ST elevation (NSTEMI) myocardial infarction: Secondary | ICD-10-CM

## 2015-11-06 DIAGNOSIS — Z9889 Other specified postprocedural states: Secondary | ICD-10-CM

## 2015-11-06 DIAGNOSIS — IMO0001 Reserved for inherently not codable concepts without codable children: Secondary | ICD-10-CM

## 2015-11-06 DIAGNOSIS — Z9289 Personal history of other medical treatment: Secondary | ICD-10-CM

## 2015-11-06 HISTORY — DX: Personal history of other medical treatment: Z92.89

## 2015-11-06 HISTORY — PX: CARDIAC CATHETERIZATION: SHX172

## 2015-11-06 HISTORY — DX: Other specified postprocedural states: Z98.890

## 2015-11-06 LAB — HEPATIC FUNCTION PANEL
ALK PHOS: 84 U/L (ref 38–126)
ALT: 31 U/L (ref 14–54)
AST: 27 U/L (ref 15–41)
Albumin: 3.8 g/dL (ref 3.5–5.0)
Bilirubin, Direct: 0.1 mg/dL (ref 0.1–0.5)
Indirect Bilirubin: 0.5 mg/dL (ref 0.3–0.9)
TOTAL PROTEIN: 6.6 g/dL (ref 6.5–8.1)
Total Bilirubin: 0.6 mg/dL (ref 0.3–1.2)

## 2015-11-06 LAB — BASIC METABOLIC PANEL
ANION GAP: 11 (ref 5–15)
BUN: <5 mg/dL — ABNORMAL LOW (ref 6–20)
CALCIUM: 9.1 mg/dL (ref 8.9–10.3)
CHLORIDE: 102 mmol/L (ref 101–111)
CO2: 27 mmol/L (ref 22–32)
Creatinine, Ser: 0.81 mg/dL (ref 0.44–1.00)
GFR calc non Af Amer: >60 mL/min (ref >60)
Glucose, Bld: 92 mg/dL (ref 65–99)
Potassium: 3.7 mmol/L (ref 3.5–5.1)
SODIUM: 140 mmol/L (ref 135–145)

## 2015-11-06 LAB — HEPARIN LEVEL (UNFRACTIONATED): HEPARIN UNFRACTIONATED: 0.24 [IU]/mL — AB (ref 0.30–0.70)

## 2015-11-06 LAB — SEDIMENTATION RATE: Sed Rate: 1 mm/hr (ref 0–22)

## 2015-11-06 LAB — LIPID PANEL
CHOLESTEROL: 139 mg/dL (ref 0–200)
HDL: 31 mg/dL — AB (ref >40)
LDL CALC: 72 mg/dL (ref 0–99)
TRIGLYCERIDES: 181 mg/dL — AB (ref <150)
Total CHOL/HDL Ratio: 4.5 RATIO
VLDL: 36 mg/dL (ref 0–40)

## 2015-11-06 LAB — ECHOCARDIOGRAM COMPLETE
HEIGHTINCHES: 65 in
Weight: 2857.6 oz

## 2015-11-06 LAB — MRSA PCR SCREENING: MRSA BY PCR: NEGATIVE

## 2015-11-06 LAB — PROTIME-INR
INR: 1.06 (ref 0.00–1.49)
PROTHROMBIN TIME: 14 s (ref 11.6–15.2)

## 2015-11-06 LAB — CBC
HEMATOCRIT: 46.4 % — AB (ref 36.0–46.0)
Hemoglobin: 14.9 g/dL (ref 12.0–15.0)
MCH: 30.5 pg (ref 26.0–34.0)
MCHC: 32.1 g/dL (ref 30.0–36.0)
MCV: 95.1 fL (ref 78.0–100.0)
PLATELETS: 240 10*3/uL (ref 150–400)
RBC: 4.88 MIL/uL (ref 3.87–5.11)
RDW: 13.7 % (ref 11.5–15.5)
WBC: 12.3 10*3/uL — ABNORMAL HIGH (ref 4.0–10.5)

## 2015-11-06 LAB — T4, FREE: Free T4: 1.19 ng/dL — ABNORMAL HIGH (ref 0.61–1.12)

## 2015-11-06 LAB — MAGNESIUM: Magnesium: 2.1 mg/dL (ref 1.7–2.4)

## 2015-11-06 LAB — TSH: TSH: 2.289 u[IU]/mL (ref 0.350–4.500)

## 2015-11-06 LAB — TROPONIN I
TROPONIN I: 0.34 ng/mL — AB (ref <0.031)
Troponin I: 0.32 ng/mL — ABNORMAL HIGH (ref <0.031)
Troponin I: 0.31 ng/mL — ABNORMAL HIGH (ref <0.031)

## 2015-11-06 LAB — BRAIN NATRIURETIC PEPTIDE: B Natriuretic Peptide: 11.7 pg/mL (ref 0.0–100.0)

## 2015-11-06 LAB — C-REACTIVE PROTEIN

## 2015-11-06 SURGERY — LEFT HEART CATH AND CORONARY ANGIOGRAPHY
Anesthesia: LOCAL

## 2015-11-06 MED ORDER — ATORVASTATIN CALCIUM 80 MG PO TABS: 80.0000 mg | ORAL_TABLET | Freq: Every day | ORAL | Status: DC

## 2015-11-06 MED ORDER — VERAPAMIL HCL 2.5 MG/ML IV SOLN
INTRAVENOUS | Status: DC | PRN
  Administered 2015-11-06: 10 mL via INTRA_ARTERIAL

## 2015-11-06 MED ORDER — HEPARIN (PORCINE) IN NACL 2-0.9 UNIT/ML-% IJ SOLN
INTRAMUSCULAR | Status: DC | PRN
  Administered 2015-11-06: 1000 mL

## 2015-11-06 MED ORDER — ASPIRIN EC 81 MG PO TBEC
81.0000 mg | DELAYED_RELEASE_TABLET | Freq: Every day | ORAL | Status: DC
  Administered 2015-11-06: 81 mg via ORAL
  Filled 2015-11-06: qty 1

## 2015-11-06 MED ORDER — FAMOTIDINE 20 MG PO TABS
20.0000 mg | ORAL_TABLET | Freq: Every day | ORAL | Status: DC
  Administered 2015-11-06: 20 mg via ORAL
  Filled 2015-11-06: qty 1

## 2015-11-06 MED ORDER — ONDANSETRON HCL 4 MG/2ML IJ SOLN: 4.0000 mg | Freq: Four times a day (QID) | INTRAMUSCULAR | Status: DC | PRN

## 2015-11-06 MED ORDER — NICOTINE 21 MG/24HR TD PT24
21.0000 mg | MEDICATED_PATCH | Freq: Every day | TRANSDERMAL | Status: DC
  Administered 2015-11-06: 21 mg via TRANSDERMAL
  Filled 2015-11-06: qty 1

## 2015-11-06 MED ORDER — MIDAZOLAM HCL 2 MG/2ML IJ SOLN
INTRAMUSCULAR | Status: DC | PRN
Start: 2015-11-06 — End: 2015-11-06
  Administered 2015-11-06: 1 mg via INTRAVENOUS

## 2015-11-06 MED ORDER — SODIUM CHLORIDE 0.9% FLUSH: 3.0000 mL | Freq: Two times a day (BID) | INTRAVENOUS | Status: DC

## 2015-11-06 MED ORDER — ASPIRIN 81 MG PO CHEW: 324.0000 mg | CHEWABLE_TABLET | ORAL | Status: DC

## 2015-11-06 MED ORDER — LORAZEPAM 1 MG PO TABS
1.0000 mg | ORAL_TABLET | Freq: Three times a day (TID) | ORAL | Status: DC | PRN
  Administered 2015-11-06 (×2): 1 mg via ORAL
  Filled 2015-11-06 (×2): qty 1

## 2015-11-06 MED ORDER — PNEUMOCOCCAL VAC POLYVALENT 25 MCG/0.5ML IJ INJ: 0.5000 mL | INJECTION | INTRAMUSCULAR | Status: DC

## 2015-11-06 MED ORDER — SODIUM CHLORIDE 0.9% FLUSH: 3.0000 mL | INTRAVENOUS | Status: DC | PRN

## 2015-11-06 MED ORDER — MIDAZOLAM HCL 2 MG/2ML IJ SOLN
INTRAMUSCULAR | Status: AC
  Filled 2015-11-06: qty 2

## 2015-11-06 MED ORDER — SODIUM CHLORIDE 0.9 % IV SOLN: 250.0000 mL | INTRAVENOUS | Status: DC | PRN

## 2015-11-06 MED ORDER — FENTANYL CITRATE (PF) 100 MCG/2ML IJ SOLN
INTRAMUSCULAR | Status: DC | PRN
  Administered 2015-11-06 (×2): 50 ug via INTRAVENOUS

## 2015-11-06 MED ORDER — NICOTINE 21 MG/24HR TD PT24: 21.0000 mg | MEDICATED_PATCH | Freq: Every day | TRANSDERMAL | Status: DC

## 2015-11-06 MED ORDER — ACETAMINOPHEN 325 MG PO TABS: 650.0000 mg | ORAL_TABLET | ORAL | Status: DC | PRN

## 2015-11-06 MED ORDER — IOPAMIDOL (ISOVUE-370) INJECTION 76%
INTRAVENOUS | Status: AC
  Filled 2015-11-06: qty 100

## 2015-11-06 MED ORDER — SODIUM CHLORIDE 0.9 % WEIGHT BASED INFUSION: 1.0000 mL/kg/h | INTRAVENOUS | Status: DC

## 2015-11-06 MED ORDER — ONDANSETRON HCL 4 MG/2ML IJ SOLN: 4.0000 mg | Freq: Three times a day (TID) | INTRAMUSCULAR | Status: DC | PRN

## 2015-11-06 MED ORDER — ZOLPIDEM TARTRATE 5 MG PO TABS
5.0000 mg | ORAL_TABLET | Freq: Once | ORAL | Status: AC
  Administered 2015-11-06: 5 mg via ORAL
  Filled 2015-11-06: qty 1

## 2015-11-06 MED ORDER — MORPHINE SULFATE (PF) 2 MG/ML IV SOLN
2.0000 mg | INTRAVENOUS | Status: DC | PRN
  Administered 2015-11-06 (×2): 2 mg via INTRAVENOUS
  Filled 2015-11-06 (×2): qty 1

## 2015-11-06 MED ORDER — LIDOCAINE HCL (PF) 1 % IJ SOLN
INTRAMUSCULAR | Status: AC
  Filled 2015-11-06: qty 30

## 2015-11-06 MED ORDER — FENTANYL CITRATE (PF) 100 MCG/2ML IJ SOLN
INTRAMUSCULAR | Status: AC
  Filled 2015-11-06: qty 2

## 2015-11-06 MED ORDER — ASPIRIN 81 MG PO CHEW: 81.0000 mg | CHEWABLE_TABLET | ORAL | Status: AC

## 2015-11-06 MED ORDER — SODIUM CHLORIDE 0.9 % WEIGHT BASED INFUSION
3.0000 mL/kg/h | INTRAVENOUS | Status: AC
  Administered 2015-11-06: 3 mL/kg/h via INTRAVENOUS

## 2015-11-06 MED ORDER — HEPARIN (PORCINE) IN NACL 2-0.9 UNIT/ML-% IJ SOLN
INTRAMUSCULAR | Status: AC
  Filled 2015-11-06: qty 1000

## 2015-11-06 MED ORDER — NITROGLYCERIN 0.4 MG SL SUBL: 0.4000 mg | SUBLINGUAL_TABLET | SUBLINGUAL | Status: DC | PRN

## 2015-11-06 MED ORDER — ASPIRIN 300 MG RE SUPP: 300.0000 mg | RECTAL | Status: DC

## 2015-11-06 MED ORDER — MIDAZOLAM HCL 2 MG/2ML IJ SOLN
INTRAMUSCULAR | Status: DC | PRN
  Administered 2015-11-06 (×2): 2 mg via INTRAVENOUS
  Administered 2015-11-06: 1 mg via INTRAVENOUS

## 2015-11-06 MED ORDER — POLYVINYL ALCOHOL 1.4 % OP SOLN
1.0000 [drp] | Freq: Four times a day (QID) | OPHTHALMIC | Status: DC | PRN
  Administered 2015-11-06: 1 [drp] via OPHTHALMIC
  Filled 2015-11-06: qty 15

## 2015-11-06 MED ORDER — ALPRAZOLAM 0.5 MG PO TABS
1.0000 mg | ORAL_TABLET | Freq: Two times a day (BID) | ORAL | Status: DC | PRN
  Administered 2015-11-06: 1 mg via ORAL
  Filled 2015-11-06: qty 2

## 2015-11-06 MED ORDER — HEPARIN SODIUM (PORCINE) 1000 UNIT/ML IJ SOLN
INTRAMUSCULAR | Status: AC
  Filled 2015-11-06: qty 1

## 2015-11-06 MED ORDER — SODIUM CHLORIDE 0.9 % WEIGHT BASED INFUSION
3.0000 mL/kg/h | INTRAVENOUS | Status: DC
  Administered 2015-11-06: 3 mL/kg/h via INTRAVENOUS

## 2015-11-06 MED ORDER — VERAPAMIL HCL 2.5 MG/ML IV SOLN
INTRAVENOUS | Status: AC
  Filled 2015-11-06: qty 2

## 2015-11-06 MED ORDER — LIDOCAINE HCL (PF) 1 % IJ SOLN
INTRAMUSCULAR | Status: DC | PRN
  Administered 2015-11-06: 2 mL via INTRADERMAL

## 2015-11-06 MED ORDER — ONDANSETRON HCL 4 MG/2ML IJ SOLN
INTRAMUSCULAR | Status: AC
  Filled 2015-11-06: qty 2

## 2015-11-06 MED ORDER — IOPAMIDOL (ISOVUE-370) INJECTION 76%
INTRAVENOUS | Status: DC | PRN
  Administered 2015-11-06: 75 mL via INTRA_ARTERIAL

## 2015-11-06 MED ORDER — ONDANSETRON HCL 4 MG/2ML IJ SOLN
INTRAMUSCULAR | Status: DC | PRN
  Administered 2015-11-06: 4 mg via INTRAVENOUS

## 2015-11-06 MED ORDER — HEPARIN (PORCINE) IN NACL 100-0.45 UNIT/ML-% IJ SOLN: 1050.0000 [IU]/h | INTRAMUSCULAR | Status: DC

## 2015-11-06 MED ORDER — METOPROLOL TARTRATE 25 MG PO TABS
25.0000 mg | ORAL_TABLET | Freq: Two times a day (BID) | ORAL | Status: DC
  Administered 2015-11-06: 25 mg via ORAL
  Filled 2015-11-06: qty 1

## 2015-11-06 MED ORDER — DULOXETINE HCL 60 MG PO CPEP
60.0000 mg | ORAL_CAPSULE | Freq: Every day | ORAL | Status: DC
  Administered 2015-11-06: 60 mg via ORAL
  Filled 2015-11-06: qty 1

## 2015-11-06 MED ORDER — HEPARIN SODIUM (PORCINE) 1000 UNIT/ML IJ SOLN
INTRAMUSCULAR | Status: DC | PRN
  Administered 2015-11-06: 4000 [IU] via INTRAVENOUS

## 2015-11-06 SURGICAL SUPPLY — 11 items

## 2015-11-06 NOTE — Op Note (Signed)
CARDIAC CATHETERIZATION REPORT   Procedures performed:  1. Left heart catheterization  2. Selective coronary angiography  3. Left ventriculography   Reason for procedure:  Acute non ST segment elevation myocardial infarction   Procedure performed by: Thurmon Fair, MD, [facility]  Complications: none   Estimated blood loss: less than 5 mL   History:  40 yo woman with DM, HTN and active smoker presenting with chest discomfort and increased cardiac troponin I levels.  Consent: The risks, benefits, and details of the procedure were explained to the patient. Risks including death, MI, stroke, bleeding, limb ischemia, renal failure and allergy were described and accepted by the patient. Informed written consent was obtained prior to proceeding.  Technique: The patient was brought to the cardiac catheterization laboratory in the fasting state. He was prepped and draped in the usual sterile fashion. Local anesthesia with 1% lidocaine was administered to the R wrist area. Using the modified Seldinger technique a 5 [place] R radial artery sheath was introduced without difficulty. Under fluoroscopic guidance, using 5 French TIG and angled pigtail catheters, selective cannulation of the left coronary artery, right coronary artery and left ventricle were respectively performed. Several coronary angiograms in a variety of projections were recorded, as well as a left ventriculogram in the RAO projection. Left ventricular pressure and a pull back to the aorta were recorded. No immediate complications occurred. At the end of the procedure, all catheters were removed. After the procedure, hemostasis will be achieved with manual pressure.  Contrast used: 75 mL Isovue 370  Medications: Verapamil 5 mg IA, Versed 6 mg IV, Fentanyl 100 mcg, Zofran 4 mg IV  Sedation:  Started 1305h, ended 1332h  Angiographic Findings:  1. The left main coronary artery is free of significant atherosclerosis and bifurcates in the  usual fashion into the left anterior descending artery and left circumflex coronary artery.  2. The left anterior descending artery is a large vessel that reaches the apex and generates 3 major diagonal branches. There is evidence of no luminal irregularities and no calcification. No hemodynamically meaningful stenoses are seen. 3. The left circumflex coronary artery is a medium-size vessel non dominant vessel that generates a single major oblique marginal artery with multiple branches. There is evidence of no luminal irregularities and no calcification. No hemodynamically meaningful stenoses are seen. 4. The right coronary artery is a large-size dominant vessel that generates a very large posterior descending artery and a small PLA. There is evidence of no luminal irregularities and no calcification. No hemodynamically meaningful stenoses are seen.  5. The left ventricle is normal in size. The left ventricle systolic function is normal with an estimated ejection fraction of 65%. Regional wall motion abnormalities are not seen. No left ventricular thrombus is seen. There is no mitral insufficiency. The ascending aorta appears normal. There is no aortic valve stenosis by pullback. The left ventricular end-diastolic pressure is 11 mm Hg.    IMPRESSIONS:   Angiographically normal coronary arteries. Normal left ventricular function and regional wall motion.   RECOMMENDATION:  Consider coronary vasospasm versus takotsubo sd. (no evidence of wall motion abnormalities).    Thurmon Fair, MD, [facility] [phone] office [phone] pager

## 2015-11-06 NOTE — Discharge Instructions (Signed)
Angiogram  An angiogram is an X-ray test. It is used to look at your blood vessels. For this test, a dye is put into the blood vessel being checked. The dye shows up on X-rays. It helps your doctor see if there is a blockage or other problem in the blood vessel.  BEFORE THE PROCEDURE  · Follow your doctor's instructions about limiting what you eat or drink.  · Ask your doctor if you may drink enough water to take any needed medicines the morning of the test.  · Plan to have someone take you home after the test.  · If you go home the same day as the test, plan to have someone stay with you for 24 hours.  PROCEDURE   · An IV tube will be put into one of your veins.  · You will be given a medicine that makes you relax (sedative).  · Your skin will be washed and shaved where the thin tube (catheter) will be inserted. This will usually be done in the upper part of your leg (groin). It may also be done in your arm near the elbow or in your wrist.  · You will be given a medicine that numbs the area where the tube will be inserted (local anesthetic).  · The tube will be inserted into a blood vessel.  · Using a type of X-ray (fluoroscopy) to see, your doctor will move the tube into the blood vessel to check it.  · Dye will be put in through the tube. X-rays of your blood vessels will then be taken.  Different health care providers and hospitals may do this procedure differently.  AFTER THE PROCEDURE   · If the test is done through the leg, you will be kept in bed lying flat for several hours. You will be told to not bend or cross your legs.    · The area where the tube was inserted will be checked often.    · The pulse in your feet or wrist will be checked often.    · More tests or X-rays may be done.       This information is not intended to replace advice given to you by your health care provider. Make sure you discuss any questions you have with your health care provider.     Document Released: 10/07/2008 Document  Revised: 08/01/2014 Document Reviewed: 12/12/2012  Elsevier Interactive Patient Education ©2016 [employer]vier Inc[employer]

## 2015-11-06 NOTE — Interval H&P Note (Signed)
History and Physical Interval Note:  11/06/2015 10:28 AM  Melissa Butler  has presented today for surgery, with the diagnosis of NSTEMI  The various methods of treatment have been discussed with the patient and family. After consideration of risks, benefits and other options for treatment, the patient has consented to  Procedure(s): Left Heart Cath and Coronary Angiography (N/A) as a surgical intervention .  The patient's history has been reviewed, patient examined, no change in status, stable for surgery.  I have reviewed the patient's chart and labs.  Questions were answered to the patient's satisfaction.     Latanja Lehenbauer

## 2015-11-06 NOTE — Progress Notes (Signed)
Dr. Zachery Conch paged again to make aware that patient is still having chest pain 5/10. Returned call that he will be up to see patient as soon as possible. [facility] [employer]

## 2015-11-06 NOTE — Discharge Summary (Signed)
Patient ID: Melissa Butler,  MRN: [id number], DOB/AGE: Jan 31, 1976 40 y.o.  Admit date: 11/05/2015 Discharge date: 11/06/2015  Primary Care Provider: No PCP Per Patient Primary Cardiologist: Dr Antoine Poche (new)  Discharge Diagnoses Principal Problem:   NSTEMI (non-ST elevated myocardial infarction) [facility]) Active Problems:   Chest pain   Normal coronary arteries 11/06/15   Anxiety    Procedures: Coronary angiogram 11/06/15                        Echo 11/06/15   Hospital Course:  41F with a history of smoking and anxiety. Her anxiety is so severe that she is on disability. She was admitted 11/05/15 with SSCP. Her Troponin's were positive 0.41 on adm, they remained stable but elevated discharge Troponin 0.34. Echo showed normal LVF without WMA. Cath showed normal coronaries. It's possible she had coronary spasm. She has been encouraged to stop smoking. She'll need an office f/u at least once.   Discharge Vitals:  Blood pressure 118/71, pulse 67, temperature 97.8 F (36.6 C), temperature source Oral, resp. rate 16, height  (1.651 m), weight 178 lb 9.6 oz (81.012 kg), last menstrual period 11/01/2015, SpO2 98 %.    Labs: Results for orders placed or performed during the hospital encounter of 11/05/15 (from the past 24 hour(s))  Urinalysis, Routine w reflex microscopic (not at [facility])     Status: None   Collection Time: 11/05/15  8:00 PM  Result Value Ref Range   Color, Urine YELLOW YELLOW   APPearance CLEAR CLEAR   Specific Gravity, Urine 1.010 1.005 - 1.030   pH 7.0 5.0 - 8.0   Glucose, UA NEGATIVE NEGATIVE mg/dL   Hgb urine dipstick NEGATIVE NEGATIVE   Bilirubin Urine NEGATIVE NEGATIVE   Ketones, ur NEGATIVE NEGATIVE mg/dL   Protein, ur NEGATIVE NEGATIVE mg/dL   Nitrite NEGATIVE NEGATIVE   Leukocytes, UA NEGATIVE NEGATIVE  Urine rapid drug screen (hosp performed)     Status: Abnormal   Collection Time: 11/05/15  8:00 PM  Result Value Ref Range   Opiates NONE DETECTED NONE  DETECTED   Cocaine NONE DETECTED NONE DETECTED   Benzodiazepines POSITIVE (A) NONE DETECTED   Amphetamines NONE DETECTED NONE DETECTED   Tetrahydrocannabinol NONE DETECTED NONE DETECTED   Barbiturates NONE DETECTED NONE DETECTED  Troponin I     Status: Abnormal   Collection Time: 11/05/15 10:06 PM  Result Value Ref Range   Troponin I 0.41 (H) <0.031 ng/mL  CBG monitoring, ED     Status: Abnormal   Collection Time: 11/05/15 11:39 PM  Result Value Ref Range   Glucose-Capillary 100 (H) 65 - 99 mg/dL  MRSA PCR Screening     Status: None   Collection Time: 11/06/15 12:30 AM  Result Value Ref Range   MRSA by PCR NEGATIVE NEGATIVE  C-reactive protein     Status: None   Collection Time: 11/06/15  3:37 AM  Result Value Ref Range   CRP <0.5 <1.0 mg/dL  TSH     Status: None   Collection Time: 11/06/15  3:37 AM  Result Value Ref Range   TSH 2.289 0.350 - 4.500 uIU/mL  T4, free     Status: Abnormal   Collection Time: 11/06/15  3:37 AM  Result Value Ref Range   Free T4 1.19 (H) 0.61 - 1.12 ng/dL  Troponin I     Status: Abnormal   Collection Time: 11/06/15  3:37 AM  Result Value Ref Range  Troponin I 0.32 (H) <0.031 ng/mL  Brain natriuretic peptide     Status: None   Collection Time: 11/06/15  3:37 AM  Result Value Ref Range   B Natriuretic Peptide 11.7 0.0 - 100.0 pg/mL  Lipid panel     Status: Abnormal   Collection Time: 11/06/15  3:37 AM  Result Value Ref Range   Cholesterol 139 0 - 200 mg/dL   Triglycerides 269 (H) <150 mg/dL   HDL 31 (L) >48 mg/dL   Total CHOL/HDL Ratio 4.5 RATIO   VLDL 36 0 - 40 mg/dL   LDL Cholesterol 72 0 - 99 mg/dL  Hepatic function panel     Status: None   Collection Time: 11/06/15  3:37 AM  Result Value Ref Range   Total Protein 6.6 6.5 - 8.1 g/dL   Albumin 3.8 3.5 - 5.0 g/dL   AST 27 15 - 41 U/L   ALT 31 14 - 54 U/L   Alkaline Phosphatase 84 38 - 126 U/L   Total Bilirubin 0.6 0.3 - 1.2 mg/dL   Bilirubin, Direct 0.1 0.1 - 0.5 mg/dL   Indirect  Bilirubin 0.5 0.3 - 0.9 mg/dL  Magnesium     Status: None   Collection Time: 11/06/15  3:37 AM  Result Value Ref Range   Magnesium 2.1 1.7 - 2.4 mg/dL  Troponin I     Status: Abnormal   Collection Time: 11/06/15  8:21 AM  Result Value Ref Range   Troponin I 0.31 (H) <0.031 ng/mL  Basic metabolic panel     Status: Abnormal   Collection Time: 11/06/15  8:21 AM  Result Value Ref Range   Sodium 140 135 - 145 mmol/L   Potassium 3.7 3.5 - 5.1 mmol/L   Chloride 102 101 - 111 mmol/L   CO2 27 22 - 32 mmol/L   Glucose, Bld 92 65 - 99 mg/dL   BUN <5 (L) 6 - 20 mg/dL   Creatinine, Ser 5.46 0.44 - 1.00 mg/dL   Calcium 9.1 8.9 - 27.0 mg/dL   GFR calc non Af Amer >60 >60 mL/min   GFR calc Af Amer >60 >60 mL/min   Anion gap 11 5 - 15  CBC     Status: Abnormal   Collection Time: 11/06/15  8:21 AM  Result Value Ref Range   WBC 12.3 (H) 4.0 - 10.5 K/uL   RBC 4.88 3.87 - 5.11 MIL/uL   Hemoglobin 14.9 12.0 - 15.0 g/dL   HCT 35.0 (H) 09.3 - 81.8 %   MCV 95.1 78.0 - 100.0 fL   MCH 30.5 26.0 - 34.0 pg   MCHC 32.1 30.0 - 36.0 g/dL   RDW 29.9 37.1 - 69.6 %   Platelets 240 150 - 400 K/uL  Protime-INR     Status: None   Collection Time: 11/06/15  8:21 AM  Result Value Ref Range   Prothrombin Time 14.0 11.6 - 15.2 seconds   INR 1.06 0.00 - 1.49  Heparin level (unfractionated)     Status: Abnormal   Collection Time: 11/06/15  8:21 AM  Result Value Ref Range   Heparin Unfractionated 0.24 (L) 0.30 - 0.70 IU/mL  Sedimentation rate     Status: None   Collection Time: 11/06/15  8:21 AM  Result Value Ref Range   Sed Rate 1 0 - 22 mm/hr  Troponin I     Status: Abnormal   Collection Time: 11/06/15  2:40 PM  Result Value Ref Range   Troponin I 0.34 (H) <  0.031 ng/mL    Disposition:  Follow-up Information    Follow up with Rollene Rotunda, MD.   Specialty:  Cardiology   Why:  office will call you   Contact information:   [address] [place] [address] [phone]        Discharge Medications:    Medication List    STOP taking these medications        clindamycin 150 MG capsule  Commonly known as:  CLEOCIN      TAKE these medications        acetaminophen 325 MG tablet  Commonly known as:  TYLENOL  Take 2 tablets (650 mg total) by mouth every 4 (four) hours as needed for headache or mild pain.     DULoxetine 60 MG capsule  Commonly known as:  CYMBALTA  Take 60 mg by mouth daily.     LORazepam 1 MG tablet  Commonly known as:  ATIVAN  Take 1 tablet (1 mg total) by mouth every 8 (eight) hours as needed for anxiety.     metoprolol tartrate 25 MG tablet  Commonly known as:  LOPRESSOR  Take 25 mg by mouth 2 (two) times daily.     nicotine 21 mg/24hr patch  Commonly known as:  NICODERM CQ - dosed in mg/24 hours  Place 1 patch (21 mg total) onto the skin daily.     ranitidine 150 MG tablet  Commonly known as:  ZANTAC  Take 1 tablet (150 mg total) by mouth 2 (two) times daily.     traMADol 50 MG tablet  Commonly known as:  ULTRAM  Take 1 tablet (50 mg total) by mouth every 6 (six) hours as needed.         Duration of Discharge Encounter: Greater than 30 minutes including physician time.  Jolene Provost PA-C 11/06/2015 6:42 PM

## 2015-11-06 NOTE — Progress Notes (Signed)
ANTICOAGULATION CONSULT NOTE - Initial Consult  Pharmacy Consult for Heparin Indication: chest pain/ACS  Patient Measurements: Height: 5\' 5"  (165.1 cm) Weight: 178 lb 9.6 oz (81.012 kg) IBW/kg (Calculated) : 57  Vital Signs: Temp: 98.6 F (37 C) (04/14 0047) Temp Source: Oral (04/14 0047) BP: 143/87 mmHg (04/14 0047) Pulse Rate: 80 (04/14 0047)  Labs:  Recent Labs  11/05/15 1741 11/05/15 2206  HGB 15.7*  --   HCT 48.4*  --   PLT 265  --   APTT 29  --   LABPROT 13.9  --   INR 1.05  --   CREATININE 0.71  --   TROPONINI 0.44* 0.41*    Assessment: 40 [facility]. Received 4000 unit bolus of heparin in ER. No known anticoagulation PTA. CBC stable. Cath tentatively planned for later today.   Goal of Therapy:  Heparin level 0.3-0.7 units/ml Monitor platelets by anticoagulation protocol: Yes   Plan:  1. Start heparin drip at 950 units/hr  2. HL in 6 hours and daily while on heparin 3. F/u on cath    Sheron Nightingale 11/06/2015,2:39 AM

## 2015-11-06 NOTE — Progress Notes (Signed)
CARDIAC REHAB PHASE I   Pt in bed, recently returned from cath lab, TR band on, unable to ambulate at this time. Discussed with RN, encouraged ambulation with pt prior to discharge. Completed MI education with pt and significant other at bedside.  Reviewed risk factors, MI book, coronary vasospasm and takotsubo cardiomyopathy, anti-platelet therapy, activity restrictions, ntg, exercise, heart healthy diet, carb counting, and phase 2 cardiac rehab. Pt verbalized understanding, very flat affect. Pt agrees to phase 2 cardiac rehab referral, will send to [place] per pt request. Pt in bed, call bell within reach.   [phone] Joylene Grapes, RN, BSN 11/06/2015 2:58 PM

## 2015-11-06 NOTE — Progress Notes (Signed)
Patient Name: Melissa Butler Date of Encounter: 11/06/2015     Active Problems:   ACS (acute coronary syndrome) [facility])   Chest pain   NSTEMI (non-ST elevated myocardial infarction) (HCC)    SUBJECTIVE  Sleeping comfortably. No chest pain currently.   CURRENT MEDS . aspirin EC  81 mg Oral Daily  . atorvastatin  80 mg Oral q1800  . DULoxetine  60 mg Oral Daily  . famotidine  20 mg Oral Daily  . metoprolol tartrate  25 mg Oral BID  . nicotine  21 mg Transdermal Daily  . ondansetron      . [START ON 11/07/2015] pneumococcal 23 valent vaccine  0.5 mL Intramuscular Tomorrow-1000    OBJECTIVE  Filed Vitals:   11/05/15 2230 11/05/15 2253 11/06/15 0047 11/06/15 0515  BP: 129/96 134/95 143/87 125/88  Pulse: 85 79 80 61  Temp:   98.6 F (37 C)   TempSrc:   Oral   Resp: 17 18 16 12   Height:   5\' 5"  (1.651 m)   Weight:   178 lb 9.6 oz (81.012 kg)   SpO2: 99% 98% 99% 100%    Intake/Output Summary (Last 24 hours) at 11/06/15 0639 Last data filed at 11/06/15 0600  Gross per 24 hour  Intake  72.36 ml  Output      0 ml  Net  72.36 ml   Filed Weights   11/05/15 1659 11/06/15 0047  Weight: 170 lb (77.111 kg) 178 lb 9.6 oz (81.012 kg)    PHYSICAL EXAM  General: Pleasant, NAD.  Neuro: Alert and oriented X 3. Moves all extremities spontaneously. Psych: Normal affect. HEENT:  Normal  Neck: Supple without bruits or JVD. Lungs:  Resp regular and unlabored, CTA. Heart: RRR no s3, s4, or murmurs. Abdomen: Soft, non-tender, non-distended, BS + x 4.  Extremities: No clubbing, cyanosis or edema. DP/PT/Radials 2+ and equal bilaterally.  Accessory Clinical Findings  CBC  Recent Labs  11/05/15 1741  WBC 14.6*  NEUTROABS 11.2*  HGB 15.7*  HCT 48.4*  MCV 94.9  PLT 265   Basic Metabolic Panel  Recent Labs  11/05/15 1741 11/06/15 0337  NA 140  --   K 4.0  --   CL 105  --   CO2 28  --   GLUCOSE 96  --   BUN <5*  --   CREATININE 0.71  --   CALCIUM 9.1  --   MG  --   2.1   Liver Function Tests  Recent Labs  11/06/15 0337  AST 27  ALT 31  ALKPHOS 84  BILITOT 0.6  PROT 6.6  ALBUMIN 3.8   No results for input(s): LIPASE, AMYLASE in the last 72 hours. Cardiac Enzymes  Recent Labs  11/05/15 1741 11/05/15 2206 11/06/15 0337  TROPONINI 0.44* 0.41* 0.32*   BNP Invalid input(s): POCBNP D-Dimer No results for input(s): DDIMER in the last 72 hours. Hemoglobin A1C No results for input(s): HGBA1C in the last 72 hours. Fasting Lipid Panel  Recent Labs  11/06/15 0337  CHOL 139  HDL 31*  LDLCALC 72  TRIG 383*  CHOLHDL 4.5   Thyroid Function Tests  Recent Labs  11/06/15 0337  TSH 2.289    TELE  NSR  Radiology/Studies  Dg Chest 2 View  11/05/2015  CLINICAL DATA:  Elevated blood pressure for 1 month, some chest pain, shortness of breath, productive cough, fever and nausea for 1 week, hypertension, diabetes mellitus, smoker EXAM: CHEST  2 VIEW COMPARISON:  11/01/2015  FINDINGS: Normal heart size, mediastinal contours, and pulmonary vascularity. Lungs clear. No pleural effusion or pneumothorax. Bones unremarkable. IMPRESSION: No acute abnormalities. Electronically Signed   By: Ulyses Southward M.D.   On: 11/05/2015 19:05   Dg Abd Acute W/chest  11/01/2015  CLINICAL DATA:  Abdominal pain, anxiety and vaginal bleeding for 1 month. EXAM: DG ABDOMEN ACUTE W/ 1V CHEST COMPARISON:  05/31/2014 FINDINGS: There is no evidence of dilated bowel loops or free intraperitoneal air. No radiopaque calculi or other significant radiographic abnormality is seen. Heart size and mediastinal contours are within normal limits. Both lungs are clear. IMPRESSION: Negative abdominal radiographs.  No acute cardiopulmonary disease. Electronically Signed   By: Ellery Plunk M.D.   On: 11/01/2015 21:07    ASSESSMENT AND PLAN 53F smoker with DM, HTN, and FH of CAD who presented to [facility] on 11/05/15 with chest pain and found to have NSTEMI. She was given ASA and started on IV  UFH and transferred to [facility] for further care.   NSTEMI. Troponin 0.44->0.41--> 0.32. Although the patient is young, she has several risk factors for CAD, including smoking status, DM, HTN, and FH of MI. Other potential etiologies include myocarditis, stress cardiomyopathy, PE, or SCAD. She will require angiography to define her anatomy.  - TTE pending - CRP normal  - Continue UFH, ASA, metoprolol tart  BID and atorvastatin   Diabetes mellitus: diet controlled. HgA1c pending.   Tobacco abuse: smoking cessation counseling. Continue nicotine patch  HTN: continue Lopressor  BID.   Billy Fischer PA-C  Pager [phone]  History and all data above reviewed.  Patient examined.  I agree with the findings as above.  Still with some chest pain.   The patient exam reveals COR:RRR, no rub  ,  Lungs: Clear  ,  Abd: Positive bowel sounds, no rebound no guarding, Ext No edema  .  All available labs, radiology testing, previous records reviewed. Agree with documented assessment and plan. NSTEMI:  Needs cath.  The patient understands that risks included but are not limited to stroke (1 in 1000), death (1 in 1000), kidney failure [usually temporary] (1 in 500), bleeding (1 in 200), allergic reaction [possibly serious] (1 in 200).  The patient understands and agrees to proceed.  Note negative pregnancy test.      Rollene Rotunda  9:40 AM  11/06/2015

## 2015-11-06 NOTE — Progress Notes (Signed)
ANTICOAGULATION CONSULT NOTE - Follow Up Consult  Pharmacy Consult for Heparin Indication: chest pain/ACS  Allergies  Allergen Reactions  . Ciprofloxacin Anaphylaxis, Hives and Rash  . Penicillins Anaphylaxis, Hives, Shortness Of Breath, Swelling and Rash    Has patient had a PCN reaction causing immediate rash, facial/tongue/throat swelling, SOB or lightheadedness with hypotension: Yes Has patient had a PCN reaction causing severe rash involving mucus membranes or skin necrosis: No Has patient had a PCN reaction that required hospitalization No Has patient had a PCN reaction occurring within the last 10 years: No If all of the above answers are "NO", then may proceed with Cephalosporin use.   Marland Kitchen Amoxicillin   . Codeine Nausea And Vomiting  . Metronidazole     Other reaction(s): Other - See Comments "All jittery and burning"  . Sulfa Antibiotics     Patient Measurements: Height: 5\' 5"  (165.1 cm) Weight: 178 lb 9.6 oz (81.012 kg) IBW/kg (Calculated) : 57 Heparin Dosing Weight: 74.2 kg  Vital Signs: Temp: 97.8 F (36.6 C) (04/14 0752) Temp Source: Oral (04/14 0752) BP: 131/86 mmHg (04/14 0752) Pulse Rate: 65 (04/14 0752)  Labs:  Recent Labs  11/05/15 1741 11/05/15 2206 11/06/15 0337 11/06/15 0821  HGB 15.7*  --   --  14.9  HCT 48.4*  --   --  46.4*  PLT 265  --   --  240  APTT 29  --   --   --   LABPROT 13.9  --   --  14.0  INR 1.05  --   --  1.06  HEPARINUNFRC  --   --   --  0.24*  CREATININE 0.71  --   --  0.81  TROPONINI 0.44* 0.41* 0.32* 0.31*    Estimated Creatinine Clearance: 99 mL/min (by C-G formula based on Cr of 0.81).   Medications:  Scheduled:  . aspirin EC  81 mg Oral Daily  . atorvastatin  80 mg Oral q1800  . DULoxetine  60 mg Oral Daily  . famotidine  20 mg Oral Daily  . metoprolol tartrate  25 mg Oral BID  . nicotine  21 mg Transdermal Daily  . ondansetron      . [START ON 11/07/2015] pneumococcal 23 valent vaccine  0.5 mL Intramuscular  Tomorrow-1000   Infusions:  . heparin      Assessment: 40 yo F on heparin for NSTEMI.  Noted plans for cardiac cath later today.  Heparin level is subtherapeutic on 950 units/hr.  Will adjust accordingly.  No bleeding noted.  Goal of Therapy:  Heparin level 0.3-0.7 units/ml Monitor platelets by anticoagulation protocol: Yes   Plan:  Increase heparin to 1050 units/hr Will not recheck heparin level as plans for cardiac cath later today  [employer], Pharm.D., BCPS Clinical Pharmacist Pager [phone] 11/06/2015 9:49 AM     Henriette Hesser, Judie Bonus 11/06/2015,9:46 AM

## 2015-11-06 NOTE — Progress Notes (Signed)
  Echocardiogram 2D Echocardiogram has been performed.  Delcie Roch 11/06/2015, 12:31 PM

## 2015-11-06 NOTE — Progress Notes (Signed)
Patient has arrived to room 3W14 and Dr. Zachery Conch notified that patient has arrived. [facility] [employer]

## 2015-11-06 NOTE — Care Management Note (Signed)
Case Management Note  Patient Details  Name: Melissa Butler MRN: [id number] Date of Birth: 06/01/1976  Subjective/Objective:       NSTEMI             Action/Plan: Discharge Planning:  NCM spoke to pt and states she does not have a PCP. Attempted to arrange appt with [facility] or [facility] but offices are closed for holiday. Will send an email message to [facility] Liaison, Erskine Squibb to arrange appt on Monday 11/09/2015. Pt states she will need assistance with medications. Will arrange a MATCH letter at dc. Please contact weekend NCM when pt is dc.   Expected Discharge Date:                  Expected Discharge Plan:  Home/Self Care  In-House Referral:  NA  Discharge planning Services  CM Consult, Medication Assistance, MATCH Program  Post Acute Care Choice:  NA Choice offered to:  NA  DME Arranged:  N/A DME Agency:  NA  HH Arranged:  NA HH Agency:  NA  Status of Service:  In process, will continue to follow  Medicare Important Message Given:    Date Medicare IM Given:    Medicare IM give by:    Date Additional Medicare IM Given:    Additional Medicare Important Message give by:     If discussed at Long Length of Stay Meetings, dates discussed:    Additional Comments:  Elliot Cousin, RN 11/06/2015, 2:06 PM

## 2015-11-09 ENCOUNTER — Encounter (HOSPITAL_COMMUNITY): Payer: Self-pay | Admitting: Cardiovascular Disease

## 2015-11-10 ENCOUNTER — Emergency Department (HOSPITAL_COMMUNITY): Payer: Medicaid Other

## 2015-11-10 ENCOUNTER — Encounter (HOSPITAL_COMMUNITY): Payer: Self-pay | Admitting: Emergency Medicine

## 2015-11-10 ENCOUNTER — Emergency Department (HOSPITAL_COMMUNITY)
Admission: EM | Admit: 2015-11-10 | Discharge: 2015-11-10 | Disposition: A | Discharge status: Home / Self Care | Payer: Medicaid Other | Attending: Emergency Medicine | Admitting: Emergency Medicine

## 2015-11-10 DIAGNOSIS — R109 Unspecified abdominal pain: Secondary | ICD-10-CM

## 2015-11-10 DIAGNOSIS — Z880 Allergy status to penicillin: Secondary | ICD-10-CM | POA: Diagnosis not present

## 2015-11-10 DIAGNOSIS — Z792 Long term (current) use of antibiotics: Secondary | ICD-10-CM | POA: Insufficient documentation

## 2015-11-10 DIAGNOSIS — Z79899 Other long term (current) drug therapy: Secondary | ICD-10-CM | POA: Insufficient documentation

## 2015-11-10 DIAGNOSIS — R1011 Right upper quadrant pain: Secondary | ICD-10-CM | POA: Diagnosis not present

## 2015-11-10 DIAGNOSIS — F17210 Nicotine dependence, cigarettes, uncomplicated: Secondary | ICD-10-CM | POA: Diagnosis not present

## 2015-11-10 DIAGNOSIS — Z0389 Encounter for observation for other suspected diseases and conditions ruled out: Secondary | ICD-10-CM

## 2015-11-10 DIAGNOSIS — Z9889 Other specified postprocedural states: Secondary | ICD-10-CM | POA: Insufficient documentation

## 2015-11-10 DIAGNOSIS — I10 Essential (primary) hypertension: Secondary | ICD-10-CM | POA: Insufficient documentation

## 2015-11-10 DIAGNOSIS — R079 Chest pain, unspecified: Secondary | ICD-10-CM | POA: Insufficient documentation

## 2015-11-10 DIAGNOSIS — E119 Type 2 diabetes mellitus without complications: Secondary | ICD-10-CM | POA: Diagnosis not present

## 2015-11-10 DIAGNOSIS — Z3202 Encounter for pregnancy test, result negative: Secondary | ICD-10-CM | POA: Diagnosis not present

## 2015-11-10 DIAGNOSIS — IMO0001 Reserved for inherently not codable concepts without codable children: Secondary | ICD-10-CM

## 2015-11-10 DIAGNOSIS — F419 Anxiety disorder, unspecified: Secondary | ICD-10-CM | POA: Insufficient documentation

## 2015-11-10 LAB — COMPREHENSIVE METABOLIC PANEL
ALBUMIN: 3.6 g/dL (ref 3.5–5.0)
ALK PHOS: 80 U/L (ref 38–126)
ALT: 29 U/L (ref 14–54)
AST: 42 U/L — ABNORMAL HIGH (ref 15–41)
Anion gap: 8 (ref 5–15)
BUN: 7 mg/dL (ref 6–20)
CO2: 25 mmol/L (ref 22–32)
Calcium: 8.8 mg/dL — ABNORMAL LOW (ref 8.9–10.3)
Chloride: 105 mmol/L (ref 101–111)
Creatinine, Ser: 0.82 mg/dL (ref 0.44–1.00)
GFR calc non Af Amer: >60 mL/min (ref >60)
GLUCOSE: 89 mg/dL (ref 65–99)
POTASSIUM: 4.8 mmol/L (ref 3.5–5.1)
SODIUM: 138 mmol/L (ref 135–145)
TOTAL PROTEIN: 6.2 g/dL — AB (ref 6.5–8.1)
Total Bilirubin: 1.5 mg/dL — ABNORMAL HIGH (ref 0.3–1.2)

## 2015-11-10 LAB — CBC WITH DIFFERENTIAL/PLATELET
BASOS PCT: 0 %
Basophils Absolute: 0.1 10*3/uL (ref 0.0–0.1)
EOS ABS: 0.1 10*3/uL (ref 0.0–0.7)
EOS PCT: 1 %
HCT: 45.5 % (ref 36.0–46.0)
HEMOGLOBIN: 14.4 g/dL (ref 12.0–15.0)
LYMPHS ABS: 2.2 10*3/uL (ref 0.7–4.0)
Lymphocytes Relative: 15 %
MCH: 29.9 pg (ref 26.0–34.0)
MCHC: 31.6 g/dL (ref 30.0–36.0)
MCV: 94.4 fL (ref 78.0–100.0)
MONO ABS: 0.7 10*3/uL (ref 0.1–1.0)
MONOS PCT: 5 %
NEUTROS PCT: 79 %
Neutro Abs: 11.3 10*3/uL — ABNORMAL HIGH (ref 1.7–7.7)
PLATELETS: 227 10*3/uL (ref 150–400)
RBC: 4.82 MIL/uL (ref 3.87–5.11)
RDW: 13.7 % (ref 11.5–15.5)
WBC: 14.3 10*3/uL — ABNORMAL HIGH (ref 4.0–10.5)

## 2015-11-10 LAB — URINALYSIS, ROUTINE W REFLEX MICROSCOPIC
BILIRUBIN URINE: NEGATIVE
Glucose, UA: NEGATIVE mg/dL
HGB URINE DIPSTICK: NEGATIVE
KETONES UR: 40 mg/dL — AB
Leukocytes, UA: NEGATIVE
NITRITE: NEGATIVE
PH: 7.5 (ref 5.0–8.0)
Protein, ur: NEGATIVE mg/dL
SPECIFIC GRAVITY, URINE: 1.015 (ref 1.005–1.030)

## 2015-11-10 LAB — HEMOGLOBIN A1C
HEMOGLOBIN A1C: 5.6 % (ref 4.8–5.6)
Mean Plasma Glucose: 114 mg/dL

## 2015-11-10 LAB — I-STAT BETA HCG BLOOD, ED (MC, WL, AP ONLY)

## 2015-11-10 LAB — RAPID URINE DRUG SCREEN, HOSP PERFORMED
Amphetamines: NOT DETECTED
BARBITURATES: NOT DETECTED
BENZODIAZEPINES: POSITIVE — AB
Cocaine: NOT DETECTED
Opiates: NOT DETECTED
TETRAHYDROCANNABINOL: NOT DETECTED

## 2015-11-10 LAB — I-STAT TROPONIN, ED: TROPONIN I, POC: 0 ng/mL (ref 0.00–0.08)

## 2015-11-10 LAB — LIPASE, BLOOD: Lipase: 16 U/L (ref 11–51)

## 2015-11-10 MED ORDER — NAPROXEN 500 MG PO TABS: 500.0000 mg | ORAL_TABLET | Freq: Two times a day (BID) | ORAL | Status: DC

## 2015-11-10 MED ORDER — KETOROLAC TROMETHAMINE 30 MG/ML IJ SOLN
30.0000 mg | Freq: Once | INTRAMUSCULAR | Status: AC
  Administered 2015-11-10: 30 mg via INTRAVENOUS
  Filled 2015-11-10: qty 1

## 2015-11-10 MED ORDER — NITROGLYCERIN 0.4 MG SL SUBL: 0.4000 mg | SUBLINGUAL_TABLET | SUBLINGUAL | Status: DC | PRN

## 2015-11-10 MED ORDER — MORPHINE SULFATE (PF) 4 MG/ML IV SOLN
4.0000 mg | Freq: Once | INTRAVENOUS | Status: AC
  Administered 2015-11-10: 4 mg via INTRAVENOUS
  Filled 2015-11-10: qty 1

## 2015-11-10 MED ORDER — LORAZEPAM 2 MG/ML IJ SOLN
1.0000 mg | Freq: Once | INTRAMUSCULAR | Status: AC
  Administered 2015-11-10: 1 mg via INTRAVENOUS
  Filled 2015-11-10: qty 1

## 2015-11-10 MED FILL — Hydrocodone-Acetaminophen Tab 5-325 MG: ORAL | Qty: 6 | Status: AC

## 2015-11-10 NOTE — ED Provider Notes (Signed)
I have personally seen and examined the patient.  I have discussed the plan of care with the resident.  I have reviewed the documentation on PMH/FH/Soc. History.  I have reviewed the documentation of the resident and agree.   EKG Interpretation  Date/Time:  Tuesday November 10 2015 13:44:00 EDT Ventricular Rate:  97 PR Interval:  134 QRS Duration: 83 QT Interval:  349 QTC Calculation: 443 R Axis:   -66 Text Interpretation:  Sinus rhythm Left anterior fascicular block No significant change since last tracing Confirmed by Tauren Delbuono MD, Reuel Boom [phone]) on 11/10/2015 3:19:01 PM       40 yo F With a chief complaint chest pain. This persisted since her prior visits where she was diagnosed with possible vasospasm. Patient had clean coronaries. At that time she had a positive troponin. Feels like her symptoms of been continuous. Now complaining also of some right-sided abdominal pain that goes from her right flank down into her groin. Denies vaginal bleeding or discharge. On my exam patient has clear lung sounds bilaterally noted abdominal tenderness to palpation. Her EKG is unchanged. Initial troponin is negative. Will repeat the troponin 3 hours. Likely d/c home.   Melene Plan, DO 11/11/15 [phone]

## 2015-11-10 NOTE — ED Notes (Signed)
Pt verbalized understanding of d/c instructions, prescriptions, and follow-up care. No further questions/concerns, VSS, ambulatory w/ steady gait (refused wheelchair) 

## 2015-11-10 NOTE — ED Provider Notes (Signed)
CSN: [id number]     Arrival date & time 11/10/15  1337 History   First MD Initiated Contact with Patient 11/10/15 1347     Chief Complaint  Patient presents with  . Chest Pain   HPI  Pt is a 40 y.o. female with history of HTN, DM, anxiety who presents with ongoing chest pain following negative cath 4 days ago. Pt reports that it feels like an elephant is sitting on her chest and the pain has been constant for the past week. She also endorses SOB, feeling hot, lightheadedness, loss of appetite and being unable to sleep because of the pain. She reports that starting yesterday she is also having pain in her right abdomen radiating from her "gall bladder" down in to her "appendix area." She denies any measured fevers, vomiting, diarrhea.  Past Medical History  Diagnosis Date  . Diabetes mellitus without complication (HCC)   . Hypertension   . Anxiety    Past Surgical History  Procedure Laterality Date  . Cesarean section    . Cardiac catheterization N/A 11/06/2015    Procedure: Left Heart Cath and Coronary Angiography;  Surgeon: Thurmon Fair, MD;  Location: [facility] INVASIVE CV LAB;  Service: Cardiovascular;  Laterality: N/A;   History reviewed. No pertinent family history. Social History  Substance Use Topics  . Smoking status: Current Some Day Smoker -- 0.50 packs/day    Types: Cigarettes  . Smokeless tobacco: None  . Alcohol Use: No   OB History    Gravida Para Term Preterm AB TAB SAB Ectopic Multiple Living            2     Review of Systems See HPI   Allergies  Ciprofloxacin; Penicillins; Amoxicillin; Codeine; Metronidazole; and Sulfa antibiotics  Home Medications   Prior to Admission medications   Medication Sig Start Date End Date Taking? Authorizing Provider  acetaminophen (TYLENOL) 325 MG tablet Take 2 tablets (650 mg total) by mouth every 4 (four) hours as needed for headache or mild pain. 11/06/15  Yes Luke K Kilroy, PA-C  clindamycin (CLEOCIN) 150 MG capsule Take  1,200 mg by mouth daily.   Yes Historical Provider, MD  DULoxetine (CYMBALTA) 60 MG capsule Take 60 mg by mouth daily.   Yes Historical Provider, MD  LORazepam (ATIVAN) 1 MG tablet Take 1 tablet (1 mg total) by mouth every 8 (eight) hours as needed for anxiety. 11/01/15  Yes Bethann Berkshire, MD  metoprolol tartrate (LOPRESSOR) 25 MG tablet Take 25 mg by mouth 2 (two) times daily.   Yes Historical Provider, MD  ranitidine (ZANTAC) 150 MG tablet Take 1 tablet (150 mg total) by mouth 2 (two) times daily. 11/01/15  Yes Bethann Berkshire, MD  nicotine (NICODERM CQ - DOSED IN MG/24 HOURS) 21 mg/24hr patch Place 1 patch (21 mg total) onto the skin daily. Patient not taking: Reported on 11/10/2015 11/06/15   Abelino Derrick, PA-C  traMADol (ULTRAM) 50 MG tablet Take 1 tablet (50 mg total) by mouth every 6 (six) hours as needed. Patient not taking: Reported on 11/10/2015 11/01/15   Bethann Berkshire, MD   BP 117/82 mmHg  Pulse 95  Temp(Src) 99.2 F (37.3 C) (Oral)  SpO2 100%  LMP 11/01/2015 Physical Exam  Constitutional: She is oriented to person, place, and time. She appears well-developed and well-nourished. No distress.  HENT:  Head: Normocephalic and atraumatic.  Right Ear: External ear normal.  Left Ear: External ear normal.  Nose: Nose normal.  Mouth/Throat: Oropharynx is clear and  moist.  Eyes: Conjunctivae are normal. Pupils are equal, round, and reactive to light. Right eye exhibits no discharge. Left eye exhibits no discharge.  Neck: Normal range of motion. Neck supple.  Cardiovascular: Normal rate, regular rhythm, normal heart sounds and intact distal pulses.   No murmur heard. Pulmonary/Chest: Effort normal and breath sounds normal. No respiratory distress. She has no wheezes.  Abdominal: Soft. Bowel sounds are normal. She exhibits no distension. There is no rebound and no guarding.  Tender in both upper quadrants to deep palpation  Neurological: She is alert and oriented to person, place, and time.   Skin: Skin is warm and dry. No rash noted. She is not diaphoretic. No pallor.  Psychiatric: Her behavior is normal.  Anxious affect  Nursing note and vitals reviewed.   ED Course  Procedures (including critical care time) Labs Review Labs Reviewed  CBC WITH DIFFERENTIAL/PLATELET - Abnormal; Notable for the following:    WBC 14.3 (*)    Neutro Abs 11.3 (*)    All other components within normal limits  URINE RAPID DRUG SCREEN, HOSP PERFORMED  COMPREHENSIVE METABOLIC PANEL  LIPASE, BLOOD  URINALYSIS, ROUTINE W REFLEX MICROSCOPIC (NOT AT [facility])  I-STAT TROPOININ, ED  I-STAT BETA HCG BLOOD, ED ([facility], [facility], [facility] ONLY)    Imaging Review No results found. I have personally reviewed and evaluated these images and lab results as part of my medical decision-making.   EKG Interpretation None      MDM   Final diagnoses:  None   Pt with ongoing chest pain following negative cath. Will check delta troponin as well as labs and CT abdomen given new abdominal pain.  Negative CT and labs. Will sign out second troponin to Dr. Effie Shy and anticipate d/c home following this.  Abram Sander, MD 11/10/15 1556  Melene Plan, DO 11/11/15 [phone]

## 2015-11-10 NOTE — Discharge Instructions (Signed)

## 2015-11-10 NOTE — ED Notes (Signed)
Pt to ER via [facility] with complaint of central chest pressure with radiation to right arm. Pt discharged Saturday after having cardiac workup. Pt did have positive troponins but cardiac cath was negative for any blockages. Pt reports the chest pain has not gone away since her admission but today is worse. Pt received 2 ntiro in route without any relief, 324 mg of aspirin and 4 mg of zofran. Pt is alert and oriented x4. VS - HR 106 Sinus tach, BP 114/64, 97% on RA, 18 RR.

## 2015-11-11 ENCOUNTER — Emergency Department (HOSPITAL_COMMUNITY)
Admission: EM | Admit: 2015-11-11 | Discharge: 2015-11-11 | Disposition: A | Discharge status: Home / Self Care | Payer: Medicaid Other | Attending: Emergency Medicine | Admitting: Emergency Medicine

## 2015-11-11 ENCOUNTER — Emergency Department (HOSPITAL_COMMUNITY): Payer: Medicaid Other

## 2015-11-11 ENCOUNTER — Encounter (HOSPITAL_COMMUNITY): Payer: Self-pay | Admitting: Emergency Medicine

## 2015-11-11 DIAGNOSIS — N760 Acute vaginitis: Secondary | ICD-10-CM | POA: Diagnosis not present

## 2015-11-11 DIAGNOSIS — I213 ST elevation (STEMI) myocardial infarction of unspecified site: Secondary | ICD-10-CM | POA: Diagnosis not present

## 2015-11-11 DIAGNOSIS — E119 Type 2 diabetes mellitus without complications: Secondary | ICD-10-CM | POA: Diagnosis not present

## 2015-11-11 DIAGNOSIS — I10 Essential (primary) hypertension: Secondary | ICD-10-CM | POA: Insufficient documentation

## 2015-11-11 DIAGNOSIS — Z79899 Other long term (current) drug therapy: Secondary | ICD-10-CM | POA: Diagnosis not present

## 2015-11-11 DIAGNOSIS — F17210 Nicotine dependence, cigarettes, uncomplicated: Secondary | ICD-10-CM | POA: Insufficient documentation

## 2015-11-11 DIAGNOSIS — R079 Chest pain, unspecified: Secondary | ICD-10-CM | POA: Insufficient documentation

## 2015-11-11 HISTORY — DX: Acute myocardial infarction, unspecified: I21.9

## 2015-11-11 LAB — URINALYSIS, ROUTINE W REFLEX MICROSCOPIC
Bilirubin Urine: NEGATIVE
GLUCOSE, UA: NEGATIVE mg/dL
Hgb urine dipstick: NEGATIVE
Ketones, ur: NEGATIVE mg/dL
Nitrite: NEGATIVE
PH: 6.5 (ref 5.0–8.0)
PROTEIN: NEGATIVE mg/dL
Specific Gravity, Urine: <1.005 — ABNORMAL LOW (ref 1.005–1.030)

## 2015-11-11 LAB — WET PREP, GENITAL
Sperm: NONE SEEN
TRICH WET PREP: NONE SEEN
Yeast Wet Prep HPF POC: NONE SEEN

## 2015-11-11 LAB — BASIC METABOLIC PANEL
Anion gap: 8 (ref 5–15)
BUN: 6 mg/dL (ref 6–20)
CO2: 25 mmol/L (ref 22–32)
CREATININE: 0.72 mg/dL (ref 0.44–1.00)
Calcium: 8.9 mg/dL (ref 8.9–10.3)
Chloride: 105 mmol/L (ref 101–111)
GFR calc Af Amer: >60 mL/min (ref >60)
Glucose, Bld: 82 mg/dL (ref 65–99)
POTASSIUM: 4 mmol/L (ref 3.5–5.1)
SODIUM: 138 mmol/L (ref 135–145)

## 2015-11-11 LAB — RAPID URINE DRUG SCREEN, HOSP PERFORMED
Amphetamines: NOT DETECTED
BARBITURATES: NOT DETECTED
Benzodiazepines: POSITIVE — AB
COCAINE: NOT DETECTED
OPIATES: NOT DETECTED
Tetrahydrocannabinol: NOT DETECTED

## 2015-11-11 LAB — CBC WITH DIFFERENTIAL/PLATELET
BASOS ABS: 0.1 10*3/uL (ref 0.0–0.1)
BASOS PCT: 0 %
EOS ABS: 0.1 10*3/uL (ref 0.0–0.7)
EOS PCT: 1 %
HCT: 44.5 % (ref 36.0–46.0)
Hemoglobin: 14.1 g/dL (ref 12.0–15.0)
Lymphocytes Relative: 20 %
Lymphs Abs: 2.5 10*3/uL (ref 0.7–4.0)
MCH: 30.3 pg (ref 26.0–34.0)
MCHC: 31.7 g/dL (ref 30.0–36.0)
MCV: 95.5 fL (ref 78.0–100.0)
Monocytes Absolute: 0.6 10*3/uL (ref 0.1–1.0)
Monocytes Relative: 5 %
Neutro Abs: 9 10*3/uL — ABNORMAL HIGH (ref 1.7–7.7)
Neutrophils Relative %: 74 %
PLATELETS: 235 10*3/uL (ref 150–400)
RBC: 4.66 MIL/uL (ref 3.87–5.11)
RDW: 13.8 % (ref 11.5–15.5)
WBC: 12.1 10*3/uL — AB (ref 4.0–10.5)

## 2015-11-11 LAB — TROPONIN I: TROPONIN I: 0.39 ng/mL — AB (ref <0.031)

## 2015-11-11 LAB — URINE MICROSCOPIC-ADD ON
Bacteria, UA: NONE SEEN
RBC / HPF: NONE SEEN RBC/hpf (ref 0–5)

## 2015-11-11 LAB — ETHANOL: Alcohol, Ethyl (B): <5 mg/dL (ref <5)

## 2015-11-11 MED ORDER — KETOROLAC TROMETHAMINE 30 MG/ML IJ SOLN
30.0000 mg | Freq: Once | INTRAMUSCULAR | Status: DC
  Filled 2015-11-11: qty 1

## 2015-11-11 MED ORDER — HYDROCODONE-ACETAMINOPHEN 5-325 MG PO TABS
1.0000 | ORAL_TABLET | Freq: Once | ORAL | Status: AC
  Administered 2015-11-11: 1 via ORAL
  Filled 2015-11-11: qty 1

## 2015-11-11 MED ORDER — ONDANSETRON 4 MG PO TBDP
4.0000 mg | ORAL_TABLET | Freq: Once | ORAL | Status: AC
  Administered 2015-11-11: 4 mg via ORAL
  Filled 2015-11-11: qty 1

## 2015-11-11 MED ORDER — LORAZEPAM 1 MG PO TABS
1.0000 mg | ORAL_TABLET | Freq: Once | ORAL | Status: AC
  Administered 2015-11-11: 1 mg via ORAL
  Filled 2015-11-11: qty 1

## 2015-11-11 MED ORDER — ALBUTEROL SULFATE HFA 108 (90 BASE) MCG/ACT IN AERS
2.0000 | INHALATION_SPRAY | RESPIRATORY_TRACT | Status: DC | PRN
  Administered 2015-11-11: 2 via RESPIRATORY_TRACT
  Filled 2015-11-11: qty 6.7

## 2015-11-11 MED ORDER — CLINDAMYCIN HCL 300 MG PO CAPS: 300.0000 mg | ORAL_CAPSULE | Freq: Two times a day (BID) | ORAL | Status: DC

## 2015-11-11 MED ORDER — HYDROCODONE-ACETAMINOPHEN 5-325 MG PO TABS: 1.0000 | ORAL_TABLET | Freq: Once | ORAL | Status: DC

## 2015-11-11 NOTE — ED Notes (Addendum)
Per EMS: Pt reports cp x2 hours, hx MI (last week) cardiac cath was negative. Pt was not given any medication.  Pt had 1 nitro with no relief and self administered 5 81 mg ASA. Pt also c/o yellow discharge vaginally x1 week.   116/72, 78

## 2015-11-11 NOTE — ED Provider Notes (Signed)
CSN: [id number]     Arrival date & time 11/11/15  1800 History   First MD Initiated Contact with Patient 11/11/15 1805     Chief Complaint  Patient presents with  . Chest Pain     (Consider location/radiation/quality/duration/timing/severity/associated sxs/prior Treatment) HPI   Melissa Butler is a 40 y.o. female presents for evaluation of ongoing central chest pain, for 1 week. She also has now new pain in the left flank, described as "piercing", vaginal discharge, and follow vaginal odor. He denies dysuria, hematuria, urinary frequency. She has ongoing nausea without vomiting. She denies diarrhea or constipation. Denies fever or chills. Cardiac catheterization 11/06/2015, at that time she had normal coronary arteries and normal ventricular wall motion. She was diagnosed with an STEMI, and the differential diagnosis included coronary vasospasm and Takatsubo Cardiomyopathy. She was referred to cardiology as an outpatient but has not seen them yet. She does not have a primary care doctor. She was in the ED, at [facility] yesterday with ongoing chest pain evaluated with this, and imaging, and discharged in stable condition. She chose to come to this hospital today because she "likes it here". There are no other known modifying factors.  Past Medical History  Diagnosis Date  . Diabetes mellitus without complication (HCC)   . Hypertension   . Anxiety   . MI (myocardial infarction) [facility])    Past Surgical History  Procedure Laterality Date  . Cesarean section    . Cardiac catheterization N/A 11/06/2015    Procedure: Left Heart Cath and Coronary Angiography;  Surgeon: Thurmon Fair, MD;  Location: [facility] INVASIVE CV LAB;  Service: Cardiovascular;  Laterality: N/A;   History reviewed. No pertinent family history. Social History  Substance Use Topics  . Smoking status: Current Some Day Smoker -- 0.50 packs/day    Types: Cigarettes  . Smokeless tobacco: None  . Alcohol Use: No   OB History     Gravida Para Term Preterm AB TAB SAB Ectopic Multiple Living            2     Review of Systems  All other systems reviewed and are negative.     Allergies  Ciprofloxacin; Penicillins; Amoxicillin; Codeine; Metronidazole; Sulfa antibiotics; and Toradol  Home Medications   Prior to Admission medications   Medication Sig Start Date End Date Taking? Authorizing Provider  DULoxetine (CYMBALTA) 60 MG capsule Take 60 mg by mouth daily.   Yes Historical Provider, MD  LORazepam (ATIVAN) 1 MG tablet Take 1 tablet (1 mg total) by mouth every 8 (eight) hours as needed for anxiety. 11/01/15  Yes Bethann Berkshire, MD  ranitidine (ZANTAC) 150 MG tablet Take 1 tablet (150 mg total) by mouth 2 (two) times daily. 11/01/15  Yes Bethann Berkshire, MD  acetaminophen (TYLENOL) 325 MG tablet Take 2 tablets (650 mg total) by mouth every 4 (four) hours as needed for headache or mild pain. 11/06/15   Abelino Derrick, PA-C  clindamycin (CLEOCIN) 300 MG capsule Take 1 capsule (300 mg total) by mouth 2 (two) times daily. X 7 days 11/11/15   Mancel Bale, MD  metoprolol tartrate (LOPRESSOR) 25 MG tablet Take 25 mg by mouth 2 (two) times daily.    Historical Provider, MD  naproxen (NAPROSYN) 500 MG tablet Take 1 tablet (500 mg total) by mouth 2 (two) times daily with a meal. 11/10/15   Abram Sander, MD  traMADol (ULTRAM) 50 MG tablet Take 1 tablet (50 mg total) by mouth every 6 (six) hours as  needed. 11/01/15   Bethann Berkshire, MD   BP 120/77 mmHg  Pulse 101  Temp(Src) 98.9 F (37.2 C) (Oral)  Resp 22  Ht 5\' 3"  (1.6 m)  Wt 170 lb (77.111 kg)  BMI 30.12 kg/m2  SpO2 95%  LMP 11/01/2015 Physical Exam  Constitutional: She is oriented to person, place, and time. She appears well-developed and well-nourished.  HENT:  Head: Normocephalic and atraumatic.  Right Ear: External ear normal.  Left Ear: External ear normal.  Eyes: Conjunctivae and EOM are normal. Pupils are equal, round, and reactive to light.  Neck: Normal range of  motion and phonation normal. Neck supple.  Cardiovascular: Normal rate, regular rhythm and normal heart sounds.   Pulmonary/Chest: Effort normal and breath sounds normal. No respiratory distress. She has no wheezes. She exhibits no tenderness and no bony tenderness.  Abdominal: Soft. She exhibits no distension and no mass. There is no tenderness. There is no guarding.  Musculoskeletal: Normal range of motion.  Neurological: She is alert and oriented to person, place, and time. No cranial nerve deficit or sensory deficit. She exhibits normal muscle tone. Coordination normal.  Skin: Skin is warm, dry and intact.  Psychiatric: She has a normal mood and affect. Her behavior is normal. Judgment and thought content normal.  Nursing note and vitals reviewed.   ED Course  Procedures (including critical care time)  . Initial clinical evaluation - nonspecific chest pain, left flank pain, and vaginal discharge. Doubt ACS, serious bacterial infection or PID. Will evaluate  with labs, pelvic exam, and reassess.   Medications  HYDROcodone-acetaminophen (NORCO/VICODIN) 5-325 MG per tablet 1 tablet (1 tablet Oral Given 11/11/15 2044)  ondansetron (ZOFRAN-ODT) disintegrating tablet 4 mg (4 mg Oral Given 11/11/15 2051)  LORazepam (ATIVAN) tablet 1 mg (1 mg Oral Given 11/11/15 2051)  HYDROcodone-acetaminophen (NORCO/VICODIN) 5-325 MG per tablet 1 tablet (1 tablet Oral Given 11/11/15 2250)    Patient Vitals for the past 24 hrs:  BP Temp Temp src Pulse Resp SpO2 Height Weight  11/11/15 2249 120/77 mmHg - - 101 22 95 % - -  11/11/15 1943 122/79 mmHg - - 79 19 100 % - -  11/11/15 1830 109/86 mmHg - - 89 21 98 % - -  11/11/15 1806 117/79 mmHg 98.9 F (37.2 C) Oral 90 18 98 % 5\' 3"  (1.6 m) 170 lb (77.111 kg)    10:45 PM Reevaluation with update and discussion. After initial assessment and treatment, an updated evaluation reveals alert, bright, calm. Ambulates easily. Ebenezer Mccaskey L    Labs Review Labs  Reviewed  WET PREP, GENITAL - Abnormal; Notable for the following:    Clue Cells Wet Prep HPF POC PRESENT (*)    WBC, Wet Prep HPF POC FEW (*)    All other components within normal limits  CBC WITH DIFFERENTIAL/PLATELET - Abnormal; Notable for the following:    WBC 12.1 (*)    Neutro Abs 9.0 (*)    All other components within normal limits  TROPONIN I - Abnormal; Notable for the following:    Troponin I 0.39 (*)    All other components within normal limits  URINE RAPID DRUG SCREEN, HOSP PERFORMED - Abnormal; Notable for the following:    Benzodiazepines POSITIVE (*)    All other components within normal limits  URINALYSIS, ROUTINE W REFLEX MICROSCOPIC (NOT AT [facility]) - Abnormal; Notable for the following:    Specific Gravity, Urine <1.005 (*)    Leukocytes, UA TRACE (*)    All other  components within normal limits  URINE MICROSCOPIC-ADD ON - Abnormal; Notable for the following:    Squamous Epithelial / LPF 6-30 (*)    All other components within normal limits  BASIC METABOLIC PANEL  ETHANOL  HIV ANTIBODY (ROUTINE TESTING)  RPR  GC/CHLAMYDIA PROBE AMP ([place]) NOT AT [facility]    Imaging Review Dg Chest 2 View  11/12/2015  CLINICAL DATA:  Myocardial infarction last week with continued chest pain shortness of breath EXAM: CHEST  2 VIEW COMPARISON:  11/11/2015 FINDINGS: The heart size and mediastinal contours are within normal limits. Both lungs are clear. The visualized skeletal structures are unremarkable. IMPRESSION: No active cardiopulmonary disease. Electronically Signed   By: Esperanza Heir M.D.   On: 11/12/2015 14:09   Dg Chest 2 View  11/11/2015  CLINICAL DATA:  Chest pain. Chronic nonproductive cough. Recent myocardial infarct approximately 1 week ago. EXAM: CHEST  2 VIEW COMPARISON:  11/05/2015 FINDINGS: The heart size and mediastinal contours are within normal limits. Both lungs are clear. The visualized skeletal structures are unremarkable. IMPRESSION: No active  cardiopulmonary disease. Electronically Signed   By: Myles Rosenthal M.D.   On: 11/11/2015 19:58   I have personally reviewed and evaluated these images and lab results as part of my medical decision-making.   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 Recent cardiac cath d/t increased Troponin. DDX after cath, vasospam vs., Takatsubos. Troponin has stayed the same for 4 days, with variance noted yesterday, on Istat testing. Patient has a chronically mildly elevated Troponin, unrelated to acute cardiac abnormality. She has numerous other c/o, and minimal findings, on comprehensive evaluations. Mild vaginitis today. Doubt ACS, PE, PNE, SBI or impending vascular collapse. Stable chronic psychiatric disease, characterized by anxiety.  Nursing Notes Reviewed/ Care Coordinated Applicable Imaging Reviewed Interpretation of Laboratory Data incorporated into ED treatment  The patient appears reasonably screened and/or stabilized for discharge and I doubt any other medical condition or other [facility] requiring further screening, evaluation, or treatment in the ED at this time prior to discharge.  Plan: Home Medications- Clindamycin; Home Treatments- rest; return here if the recommended treatment, does not improve the symptoms; Recommended follow up- PCP 1 week. Psychiatry, prn     Mancel Bale, MD 11/12/15 [phone]

## 2015-11-11 NOTE — Discharge Instructions (Signed)
Bacterial Vaginosis Bacterial vaginosis is an infection of the vagina. It happens when too many germs (bacteria) grow in the vagina. Having this infection puts you at risk for getting other infections from sex. Treating this infection can help lower your risk for other infections, such as:   Chlamydia.  Gonorrhea.  HIV.  Herpes. HOME CARE  Take your medicine as told by your doctor.  Finish your medicine even if you start to feel better.  Tell your sex partner that you have an infection. They should see their doctor for treatment.  During treatment:  Avoid sex or use condoms correctly.  Do not douche.  Do not drink alcohol unless your doctor tells you it is ok.  Do not breastfeed unless your doctor tells you it is ok. GET HELP IF:  You are not getting better after 3 days of treatment.  You have more grey fluid (discharge) coming from your vagina than before.  You have more pain than before.  You have a fever. MAKE SURE YOU:   Understand these instructions.  Will watch your condition.  Will get help right away if you are not doing well or get worse.   This information is not intended to replace advice given to you by your health care provider. Make sure you discuss any questions you have with your health care provider.   Document Released: 04/19/2008 Document Revised: 08/01/2014 Document Reviewed: 02/20/2013 Elsevier Interactive Patient Education 2016 [employer]  Nonspecific Chest Pain It is often hard to find the cause of chest pain. There is always a chance that your pain could be related to something serious, such as a heart attack or a blood clot in your lungs. Chest pain can also be caused by conditions that are not life-threatening. If you have chest pain, it is very important to follow up with your doctor.  HOME CARE  If you were prescribed an antibiotic medicine, finish it all even if you start to feel better.  Avoid any activities that cause chest  pain.  Do not use any tobacco products, including cigarettes, chewing tobacco, or electronic cigarettes. If you need help quitting, ask your doctor.  Do not drink alcohol.  Take medicines only as told by your doctor.  Keep all follow-up visits as told by your doctor. This is important. This includes any further testing if your chest pain does not go away.  Your doctor may tell you to keep your head raised (elevated) while you sleep.  Make lifestyle changes as told by your doctor. These may include:  Getting regular exercise. Ask your doctor to suggest some activities that are safe for you.  Eating a heart-healthy diet. Your doctor or a diet specialist (dietitian) can help you to learn healthy eating options.  Maintaining a healthy weight.  Managing diabetes, if necessary.  Reducing stress. GET HELP IF:  Your chest pain does not go away, even after treatment.  You have a rash with blisters on your chest.  You have a fever. GET HELP RIGHT AWAY IF:  Your chest pain is worse.  You have an increasing cough, or you cough up blood.  You have severe belly (abdominal) pain.  You feel extremely weak.  You pass out (faint).  You have chills.  You have sudden, unexplained chest discomfort.  You have sudden, unexplained discomfort in your arms, back, neck, or jaw.  You have shortness of breath at any time.  You suddenly start to sweat, or your skin gets clammy.  You  feel nauseous.  You vomit.  You suddenly feel light-headed or dizzy.  Your heart begins to beat quickly, or it feels like it is skipping beats. These symptoms may be an emergency. Do not wait to see if the symptoms will go away. Get medical help right away. Call your local emergency services (911 in the [place] Do not drive yourself to the hospital.   This information is not intended to replace advice given to you by your health care provider. Make sure you discuss any questions you have with your health  care provider.   Document Released: 12/28/2007 Document Revised: 08/01/2014 Document Reviewed: 02/14/2014 Elsevier Interactive Patient Education [employer]

## 2015-11-12 ENCOUNTER — Emergency Department (HOSPITAL_COMMUNITY)
Admission: EM | Admit: 2015-11-12 | Discharge: 2015-11-12 | Disposition: A | Discharge status: Home / Self Care | Payer: Medicaid Other | Attending: Emergency Medicine | Admitting: Emergency Medicine

## 2015-11-12 ENCOUNTER — Encounter (HOSPITAL_COMMUNITY): Payer: Self-pay | Admitting: Emergency Medicine

## 2015-11-12 ENCOUNTER — Emergency Department (HOSPITAL_COMMUNITY): Payer: Medicaid Other

## 2015-11-12 DIAGNOSIS — F419 Anxiety disorder, unspecified: Secondary | ICD-10-CM | POA: Diagnosis not present

## 2015-11-12 DIAGNOSIS — I10 Essential (primary) hypertension: Secondary | ICD-10-CM | POA: Insufficient documentation

## 2015-11-12 DIAGNOSIS — F17210 Nicotine dependence, cigarettes, uncomplicated: Secondary | ICD-10-CM | POA: Insufficient documentation

## 2015-11-12 DIAGNOSIS — E119 Type 2 diabetes mellitus without complications: Secondary | ICD-10-CM | POA: Insufficient documentation

## 2015-11-12 DIAGNOSIS — R1084 Generalized abdominal pain: Secondary | ICD-10-CM | POA: Diagnosis not present

## 2015-11-12 DIAGNOSIS — N898 Other specified noninflammatory disorders of vagina: Secondary | ICD-10-CM | POA: Diagnosis not present

## 2015-11-12 DIAGNOSIS — G8929 Other chronic pain: Secondary | ICD-10-CM | POA: Diagnosis not present

## 2015-11-12 DIAGNOSIS — Z880 Allergy status to penicillin: Secondary | ICD-10-CM | POA: Diagnosis not present

## 2015-11-12 DIAGNOSIS — R079 Chest pain, unspecified: Secondary | ICD-10-CM | POA: Insufficient documentation

## 2015-11-12 DIAGNOSIS — I252 Old myocardial infarction: Secondary | ICD-10-CM | POA: Insufficient documentation

## 2015-11-12 DIAGNOSIS — Z9889 Other specified postprocedural states: Secondary | ICD-10-CM | POA: Insufficient documentation

## 2015-11-12 DIAGNOSIS — R109 Unspecified abdominal pain: Secondary | ICD-10-CM | POA: Diagnosis present

## 2015-11-12 LAB — CBC
HCT: 46.3 % — ABNORMAL HIGH (ref 36.0–46.0)
HEMOGLOBIN: 14.9 g/dL (ref 12.0–15.0)
MCH: 30.2 pg (ref 26.0–34.0)
MCHC: 32.2 g/dL (ref 30.0–36.0)
MCV: 93.9 fL (ref 78.0–100.0)
Platelets: 225 10*3/uL (ref 150–400)
RBC: 4.93 MIL/uL (ref 3.87–5.11)
RDW: 14 % (ref 11.5–15.5)
WBC: 11.5 10*3/uL — AB (ref 4.0–10.5)

## 2015-11-12 LAB — HEPATIC FUNCTION PANEL
ALBUMIN: 3.7 g/dL (ref 3.5–5.0)
ALK PHOS: 83 U/L (ref 38–126)
ALT: 25 U/L (ref 14–54)
AST: 27 U/L (ref 15–41)
Bilirubin, Direct: <0.1 mg/dL — ABNORMAL LOW (ref 0.1–0.5)
TOTAL PROTEIN: 6.5 g/dL (ref 6.5–8.1)
Total Bilirubin: 0.5 mg/dL (ref 0.3–1.2)

## 2015-11-12 LAB — BASIC METABOLIC PANEL
ANION GAP: 10 (ref 5–15)
BUN: 6 mg/dL (ref 6–20)
CALCIUM: 9.4 mg/dL (ref 8.9–10.3)
CO2: 22 mmol/L (ref 22–32)
Chloride: 107 mmol/L (ref 101–111)
Creatinine, Ser: 0.78 mg/dL (ref 0.44–1.00)
GFR calc Af Amer: >60 mL/min (ref >60)
GLUCOSE: 90 mg/dL (ref 65–99)
POTASSIUM: 4.2 mmol/L (ref 3.5–5.1)
SODIUM: 139 mmol/L (ref 135–145)

## 2015-11-12 LAB — RPR, QUANT+TP ABS (REFLEX)
Rapid Plasma Reagin, Quant: 1:1 {titer} — ABNORMAL HIGH
TREPONEMA PALLIDUM AB: NEGATIVE

## 2015-11-12 LAB — I-STAT TROPONIN, ED: TROPONIN I, POC: 0 ng/mL (ref 0.00–0.08)

## 2015-11-12 LAB — RPR: RPR Ser Ql: REACTIVE — AB

## 2015-11-12 LAB — LIPASE, BLOOD: Lipase: 22 U/L (ref 11–51)

## 2015-11-12 LAB — HIV ANTIBODY (ROUTINE TESTING W REFLEX): HIV SCREEN 4TH GENERATION: NONREACTIVE

## 2015-11-12 MED ORDER — DICYCLOMINE HCL 20 MG PO TABS: 20.0000 mg | ORAL_TABLET | Freq: Three times a day (TID) | ORAL | Status: DC

## 2015-11-12 MED ORDER — DICYCLOMINE HCL 10 MG PO CAPS
10.0000 mg | ORAL_CAPSULE | Freq: Once | ORAL | Status: AC
  Administered 2015-11-12: 10 mg via ORAL
  Filled 2015-11-12: qty 1

## 2015-11-12 MED ORDER — ONDANSETRON HCL 4 MG/2ML IJ SOLN
4.0000 mg | Freq: Once | INTRAMUSCULAR | Status: AC | PRN
  Administered 2015-11-12: 4 mg via INTRAVENOUS

## 2015-11-12 MED ORDER — DIPHENHYDRAMINE HCL 50 MG/ML IJ SOLN
25.0000 mg | Freq: Once | INTRAMUSCULAR | Status: AC
  Administered 2015-11-12: 25 mg via INTRAVENOUS
  Filled 2015-11-12: qty 1

## 2015-11-12 MED ORDER — LORAZEPAM 2 MG/ML IJ SOLN
1.0000 mg | Freq: Once | INTRAMUSCULAR | Status: AC
  Administered 2015-11-12: 1 mg via INTRAVENOUS
  Filled 2015-11-12: qty 1

## 2015-11-12 MED ORDER — METOCLOPRAMIDE HCL 5 MG/ML IJ SOLN
10.0000 mg | Freq: Once | INTRAMUSCULAR | Status: AC
  Administered 2015-11-12: 10 mg via INTRAVENOUS
  Filled 2015-11-12: qty 2

## 2015-11-12 MED ORDER — SODIUM CHLORIDE 0.9 % IV BOLUS (SEPSIS)
1000.0000 mL | Freq: Once | INTRAVENOUS | Status: AC
  Administered 2015-11-12: 1000 mL via INTRAVENOUS

## 2015-11-12 MED ORDER — ONDANSETRON HCL 4 MG/2ML IJ SOLN
INTRAMUSCULAR | Status: AC
  Filled 2015-11-12: qty 2

## 2015-11-12 NOTE — ED Provider Notes (Signed)
The patient is female, she has a complicated history of approximately 3 months of abdominal discomfort ever since missing her depo shot. She has had several ER visits already this week, she has had a CT scan of the abdomen and pelvis, she reports that her pain is in the lower abdomen below the umbilicus. Her labs are unremarkable, her exam is significant only for some mild tenderness but she has no guarding or peritoneal signs. She otherwise appears well and appears to be able to follow-up in the outpatient setting.  I saw and evaluated the patient, reviewed the resident's note and I agree with the findings and plan.   EKG Interpretation  Date/Time:  Thursday November 12 2015 13:14:22 EDT Ventricular Rate:  86 PR Interval:  140 QRS Duration: 74 QT Interval:  350 QTC Calculation: 418 R Axis:   69 Text Interpretation:  Normal sinus rhythm Cannot rule out Anterior infarct , age undetermined Abnormal ECG since last tracing no significant change Confirmed by Cyle Kenyon  MD, Danyele Smejkal ([id number]) on 11/12/2015 3:43:58 PM       I personally interpreted the EKG as well as the resident and agree with the interpretation on the resident's chart.  Final diagnoses:  Chronic abdominal pain  Chronic chest pain  Anxiety      Eber Hong, MD 11/15/15 2051

## 2015-11-12 NOTE — ED Notes (Signed)
Per EMS, patient went in for chest pain x 1 week ago and had a cath that was clean.   EMS advised that since last week, patient has called EMS several times since to come to the hospital with the last time being last night.   Patient complaining of chest pain and abdominal pain today.  Patient states her pain starts in her abdomen and goes to her chest.   Patient states some diarrhea and nausea today.   Patient states both the abdominal pain and chest pain have been going on for months.

## 2015-11-12 NOTE — ED Notes (Signed)
Pt reports difficulty sleeping, loss of appetite since Spetember.  Pt states, "I don't even want to leave my house.  I don't even want to drive.  I just want to feel like I used to."  Pt reports loss of son's father to cardiac arrest in September.  Pt reports preoccupation with fear that her son will lose both parents.  Pt tearful, reports lack of support system.

## 2015-11-12 NOTE — ED Provider Notes (Signed)
CSN: [id number]     Arrival date & time 11/12/15  1313 History   First MD Initiated Contact with Patient 11/12/15 1601     Chief Complaint  Patient presents with  . Chest Pain  . Abdominal Pain     (Consider location/radiation/quality/duration/timing/severity/associated sxs/prior Treatment) HPI  40 year old female with past medical history of hypertension, anxiety, diabetes, and recent admission due to possible N STEMI, with clean coronary cath in the last week, as well as recurrent ED visits for abdominal pain who presents with acute on chronic abdominal pain. The patient was just evaluated twice over the last 2 days for these symptoms and has had negative workup including negative CT of the abdomen and pelvis as well as negative pelvic exam. She did have BV for which she is taking Flagyl. Currently, she states that she is here simply because her pain has not resolved. She has had no acute change in her pain. She has had no fevers or chills. She has mild nausea when her pain becomes severe but no diarrhea. Her chest pain is chronic and not acutely worsened and she had a clean cath within the last week. Denies any acute changes in her abdominal pain. Pain is made worse with menstruation as well as palpation. Of note, she endorses a history of chronic pelvic pain and pain with menstruation. Denies any previous workup for endometriosis. She also endorses a history of chronic abdominal pain that worsened with anxiety. Her pain seems to correlate with change in stool caliber and quality.   Past Medical History  Diagnosis Date  . Diabetes mellitus without complication (HCC)   . Hypertension   . Anxiety   . MI (myocardial infarction) [facility])    Past Surgical History  Procedure Laterality Date  . Cesarean section    . Cardiac catheterization N/A 11/06/2015    Procedure: Left Heart Cath and Coronary Angiography;  Surgeon: Thurmon Fair, MD;  Location: [facility];  Service: Cardiovascular;   Laterality: N/A;   No family history on file. Social History  Substance Use Topics  . Smoking status: Current Some Day Smoker -- 0.50 packs/day    Types: Cigarettes  . Smokeless tobacco: None  . Alcohol Use: No   OB History    Gravida Para Term Preterm AB TAB SAB Ectopic Multiple Living            2     Review of Systems  Constitutional: Negative for fever, chills and fatigue.  HENT: Negative for congestion and rhinorrhea.   Eyes: Negative for visual disturbance.  Respiratory: Negative for cough, shortness of breath and wheezing.   Cardiovascular: Negative for chest pain and leg swelling.  Gastrointestinal: Positive for nausea and abdominal pain. Negative for vomiting and diarrhea.  Genitourinary: Positive for vaginal discharge and pelvic pain. Negative for dysuria and flank pain.  Musculoskeletal: Negative for neck pain and neck stiffness.  Skin: Negative for rash.  Allergic/Immunologic: Negative for immunocompromised state.  Neurological: Negative for syncope and weakness.      Allergies  Ciprofloxacin; Penicillins; Amoxicillin; Codeine; Metronidazole; Sulfa antibiotics; and Toradol  Home Medications   Prior to Admission medications   Medication Sig Start Date End Date Taking? Authorizing Provider  acetaminophen (TYLENOL) 325 MG tablet Take 2 tablets (650 mg total) by mouth every 4 (four) hours as needed for headache or mild pain. 11/06/15  Yes Luke K Kilroy, PA-C  clindamycin (CLEOCIN) 300 MG capsule Take 1 capsule (300 mg total) by mouth 2 (two) times daily.  X 7 days 11/11/15  Yes Mancel Bale, MD  DULoxetine (CYMBALTA) 60 MG capsule Take 60 mg by mouth daily.   Yes Historical Provider, MD  LORazepam (ATIVAN) 1 MG tablet Take 1 tablet (1 mg total) by mouth every 8 (eight) hours as needed for anxiety. 11/01/15  Yes Bethann Berkshire, MD  metoprolol tartrate (LOPRESSOR) 25 MG tablet Take 25 mg by mouth 2 (two) times daily.   Yes Historical Provider, MD  naproxen (NAPROSYN) 500  MG tablet Take 1 tablet (500 mg total) by mouth 2 (two) times daily with a meal. 11/10/15  Yes Abram Sander, MD  ranitidine (ZANTAC) 150 MG tablet Take 1 tablet (150 mg total) by mouth 2 (two) times daily. 11/01/15  Yes Bethann Berkshire, MD  traMADol (ULTRAM) 50 MG tablet Take 1 tablet (50 mg total) by mouth every 6 (six) hours as needed. 11/01/15  Yes Bethann Berkshire, MD   BP 120/75 mmHg  Pulse 83  Temp(Src) 98.8 F (37.1 C) (Oral)  Resp 18  Ht  (1.575 m)  Wt 77.111 kg  BMI 31.09 kg/m2  SpO2 96%  LMP 11/01/2015 Physical Exam  Constitutional: She is oriented to person, place, and time. She appears well-developed and well-nourished. No distress.  HENT:  Head: Normocephalic.  Mouth/Throat: No oropharyngeal exudate.  Eyes: Conjunctivae are normal. Pupils are equal, round, and reactive to light.  Neck: Normal range of motion. Neck supple.  Cardiovascular: Normal rate, regular rhythm, normal heart sounds and intact distal pulses.  Exam reveals no friction rub.   No murmur heard. Pulmonary/Chest: Effort normal and breath sounds normal. No respiratory distress. She has no wheezes. She has no rales.  Abdominal: Soft. She exhibits no distension. There is tenderness (mild, generalized, distractible). There is no rebound and no guarding.  Musculoskeletal: She exhibits no edema.  Neurological: She is alert and oriented to person, place, and time.  Skin: No rash noted.  Nursing note and vitals reviewed.   ED Course  Procedures (including critical care time) Labs Review Labs Reviewed  CBC - Abnormal; Notable for the following:    WBC 11.5 (*)    HCT 46.3 (*)    All other components within normal limits  BASIC METABOLIC PANEL  I-STAT TROPOININ, ED    Imaging Review Dg Chest 2 View  11/12/2015  CLINICAL DATA:  Myocardial infarction last week with continued chest pain shortness of breath EXAM: CHEST  2 VIEW COMPARISON:  11/11/2015 FINDINGS: The heart size and mediastinal contours are within  normal limits. Both lungs are clear. The visualized skeletal structures are unremarkable. IMPRESSION: No active cardiopulmonary disease. Electronically Signed   By: Esperanza Heir M.D.   On: 11/12/2015 14:09   Dg Chest 2 View  11/11/2015  CLINICAL DATA:  Chest pain. Chronic nonproductive cough. Recent myocardial infarct approximately 1 week ago. EXAM: CHEST  2 VIEW COMPARISON:  11/05/2015 FINDINGS: The heart size and mediastinal contours are within normal limits. Both lungs are clear. The visualized skeletal structures are unremarkable. IMPRESSION: No active cardiopulmonary disease. Electronically Signed   By: Myles Rosenthal M.D.   On: 11/11/2015 19:58   I have personally reviewed and evaluated these images and lab results as part of my medical decision-making.   EKG Interpretation   Date/Time:  Thursday November 12 2015 13:14:22 EDT Ventricular Rate:  86 PR Interval:  140 QRS Duration: 74 QT Interval:  350 QTC Calculation: 418 R Axis:   69 Text Interpretation:  Normal sinus rhythm Cannot rule out Anterior infarct  ,  age undetermined Abnormal ECG since last tracing no significant change  Confirmed by MILLER  MD, BRIAN ([id number]) on 11/12/2015 3:43:58 PM      MDM  40 yo F with PMHx of HTN, DM2, anxiety, recent clean cardiac cath last week who presents with acute on chronic abdominal pain and chest pain. On arrival, VSS and WNL. Exam as above. Previous ED records and admission records reviewed in detail. Regarding her chest pain, this is a chronic complaint that has not acutely worsened. She just had a negative clean cath and troponin today is negative, with non-ischemic EKG - do not suspect ACS. No signs of DVT/PE and prior CTA PE have been negative. Suspect MSK chest wall pain versus anxiety. No signs of aortic dissection. Will give NSAIDs for this. Regarding her abdominal pain, this is also a well documented, chronic complaint. Recent work-up includes negative CT A/P yesterday as well as unremarkable  pelvic exam with +BV, but otherwise no acute findings. Given pelvic exam in last 24 hr, do not feel repeat exam indicated. Labs are unremarkable today, with normal LFTs, bilirubin, and CBC. No signs of abdominal guarding, rigidity, or peritonitis on exam. No signs of obstruction.  Of note, pt's sx worsened with cessation of her birth control. She endorses a h/o heavy periods and pain with menstruation. DDx at this time includes possible endometriosis, IBS given change in stool caliber/quantity with pain, as well as possible abdominal migraines. Given extensive recent negative work-up and unremarkable labs today with reassuring vitals, do not feel emergent condition is likely at this time. Will give bentyl, refer to OB-GYN for outpatient follow-up. Return precautions given. Pt in agreement.  Clinical Impression: 1. Chronic abdominal pain   2. Chronic chest pain   3. Anxiety     Disposition: Discharge  Condition: Good  I have discussed the results, Dx and Tx plan with the pt(& family if present). He/she/they expressed understanding and agree(s) with the plan. Discharge instructions discussed at great length. Strict return precautions discussed and pt &/or family have verbalized understanding of the instructions. No further questions at time of discharge.    Discharge Medication List as of 11/12/2015  5:47 PM    START taking these medications   Details  dicyclomine (BENTYL) 20 MG tablet Take 1 tablet (20 mg total) by mouth 4 (four) times daily -  before meals and at bedtime., Starting 11/12/2015, Until Discontinued, Print        Follow Up: [facility] [address] [place] [address] [phone]  Follow-up with a primary care doctor in 1 week to discuss your abdominal pain and further work-up, which may include referral to an OB-GYN.   Pt seen in conjunction with Dr. De Burrs, MD 11/13/15 2130  Eber Hong,  MD 11/15/15 2051

## 2015-11-13 ENCOUNTER — Emergency Department (HOSPITAL_COMMUNITY)
Admission: EM | Admit: 2015-11-13 | Discharge: 2015-11-13 | Disposition: A | Discharge status: Home / Self Care | Payer: Medicaid Other | Attending: Emergency Medicine | Admitting: Emergency Medicine

## 2015-11-13 ENCOUNTER — Encounter (HOSPITAL_COMMUNITY): Payer: Self-pay

## 2015-11-13 DIAGNOSIS — F17210 Nicotine dependence, cigarettes, uncomplicated: Secondary | ICD-10-CM | POA: Insufficient documentation

## 2015-11-13 DIAGNOSIS — E119 Type 2 diabetes mellitus without complications: Secondary | ICD-10-CM | POA: Diagnosis not present

## 2015-11-13 DIAGNOSIS — Z79899 Other long term (current) drug therapy: Secondary | ICD-10-CM | POA: Insufficient documentation

## 2015-11-13 DIAGNOSIS — G8929 Other chronic pain: Secondary | ICD-10-CM | POA: Diagnosis not present

## 2015-11-13 DIAGNOSIS — R1030 Lower abdominal pain, unspecified: Secondary | ICD-10-CM | POA: Insufficient documentation

## 2015-11-13 DIAGNOSIS — R072 Precordial pain: Secondary | ICD-10-CM | POA: Diagnosis present

## 2015-11-13 DIAGNOSIS — I252 Old myocardial infarction: Secondary | ICD-10-CM | POA: Insufficient documentation

## 2015-11-13 DIAGNOSIS — I10 Essential (primary) hypertension: Secondary | ICD-10-CM | POA: Diagnosis not present

## 2015-11-13 DIAGNOSIS — R42 Dizziness and giddiness: Secondary | ICD-10-CM | POA: Insufficient documentation

## 2015-11-13 DIAGNOSIS — R079 Chest pain, unspecified: Secondary | ICD-10-CM

## 2015-11-13 DIAGNOSIS — R109 Unspecified abdominal pain: Secondary | ICD-10-CM

## 2015-11-13 LAB — GC/CHLAMYDIA PROBE AMP (~~LOC~~) NOT AT ARMC
Chlamydia: NEGATIVE
NEISSERIA GONORRHEA: NEGATIVE

## 2015-11-13 MED ORDER — ACETAMINOPHEN 325 MG PO TABS
650.0000 mg | ORAL_TABLET | Freq: Once | ORAL | Status: AC
  Administered 2015-11-13: 650 mg via ORAL
  Filled 2015-11-13: qty 2

## 2015-11-13 MED ORDER — ONDANSETRON 8 MG PO TBDP
8.0000 mg | ORAL_TABLET | Freq: Once | ORAL | Status: AC
  Administered 2015-11-13: 8 mg via ORAL
  Filled 2015-11-13: qty 1

## 2015-11-13 NOTE — Discharge Instructions (Signed)

## 2015-11-13 NOTE — ED Notes (Signed)
Patient here from home for complaint of recurrent chest pain. Patient states she was seen here 9 days ago for chest pain sent to [facility] but has had no relief of chest pain since. Patient also complains of nausea and lower abdominal pain.  Also states she had a near syncopal episode while in the shower today. States she has ate 2 times today. Took 4 baby ASA while in route with EMS.

## 2015-11-13 NOTE — ED Notes (Addendum)
Pt reports that chest pain started while taking a shower this morning. Vomited x 1. Requesting pain and nausea medication. States appt with cardiologist may 30th

## 2015-11-13 NOTE — ED Provider Notes (Signed)
CSN: [id number]     Arrival date & time 11/13/15  1246 History   First MD Initiated Contact with Patient 11/13/15 1254     Chief Complaint  Patient presents with  . Chest Pain  . Abdominal Pain     Patient is a 40 y.o. female presenting with chest pain and abdominal pain. The history is provided by the patient.  Chest Pain Pain location:  Substernal area Pain quality: aching   Pain radiates to:  Neck and upper back Pain severity:  Moderate Onset quality:  Gradual Duration:  7 days Timing:  Constant Progression:  Unchanged Chronicity:  New Relieved by:  Nothing Worsened by:  Nothing tried Associated symptoms: abdominal pain, dizziness and vomiting   Associated symptoms: no fever   Abdominal Pain Associated symptoms: chest pain and vomiting   Associated symptoms: no fever    Patient reports she has constant CP that radiates to back/neck for over a week She reports SOB She reports feeling weak She also reports HA  She also reports chronic abdominal pain for weeks She has lower abdominal pain that is worsening She reports some recent vaginal bleeding but none at this time  Past Medical History  Diagnosis Date  . Diabetes mellitus without complication (HCC)   . Hypertension   . Anxiety   . MI (myocardial infarction) [facility])    Past Surgical History  Procedure Laterality Date  . Cesarean section    . Cardiac catheterization N/A 11/06/2015    Procedure: Left Heart Cath and Coronary Angiography;  Surgeon: Thurmon Fair, MD;  Location: [facility] CV [facility];  Service: Cardiovascular;  Laterality: N/A;   No family history on file. Social History  Substance Use Topics  . Smoking status: Current Some Day Smoker -- 0.50 packs/day    Types: Cigarettes  . Smokeless tobacco: None  . Alcohol Use: No   OB History    Gravida Para Term Preterm AB TAB SAB Ectopic Multiple Living            2     Review of Systems  Constitutional: Negative for fever.  Cardiovascular: Positive  for chest pain.  Gastrointestinal: Positive for vomiting and abdominal pain.  Neurological: Positive for dizziness.  All other systems reviewed and are negative.     Allergies  Ciprofloxacin; Penicillins; Amoxicillin; Codeine; Metronidazole; Sulfa antibiotics; and Toradol  Home Medications   Prior to Admission medications   Medication Sig Start Date End Date Taking? Authorizing Provider  acetaminophen (TYLENOL) 325 MG tablet Take 2 tablets (650 mg total) by mouth every 4 (four) hours as needed for headache or mild pain. 11/06/15  Yes Luke K Kilroy, PA-C  albuterol (PROAIR HFA) 108 (90 Base) MCG/ACT inhaler Inhale 2 puffs into the lungs every 6 (six) hours as needed for wheezing or shortness of breath.   Yes Historical Provider, MD  clindamycin (CLEOCIN) 300 MG capsule Take 1 capsule (300 mg total) by mouth 2 (two) times daily. X 7 days 11/11/15  Yes Mancel Bale, MD  DULoxetine (CYMBALTA) 60 MG capsule Take 60 mg by mouth daily.   Yes Historical Provider, MD  LORazepam (ATIVAN) 1 MG tablet Take 1 tablet (1 mg total) by mouth every 8 (eight) hours as needed for anxiety. 11/01/15  Yes Bethann Berkshire, MD  metoprolol tartrate (LOPRESSOR) 25 MG tablet Take 25 mg by mouth 2 (two) times daily.   Yes Historical Provider, MD  ranitidine (ZANTAC) 150 MG tablet Take 1 tablet (150 mg total) by mouth 2 (two) times  daily. 11/01/15  Yes Bethann Berkshire, MD  dicyclomine (BENTYL) 20 MG tablet Take 1 tablet (20 mg total) by mouth 4 (four) times daily -  before meals and at bedtime. Patient not taking: Reported on 11/13/2015 11/12/15   Shaune Pollack, MD  naproxen (NAPROSYN) 500 MG tablet Take 1 tablet (500 mg total) by mouth 2 (two) times daily with a meal. Patient not taking: Reported on 11/13/2015 11/10/15   Abram Sander, MD  traMADol (ULTRAM) 50 MG tablet Take 1 tablet (50 mg total) by mouth every 6 (six) hours as needed. Patient not taking: Reported on 11/13/2015 11/01/15   Bethann Berkshire, MD   BP 122/77 mmHg   Temp(Src) 98.9 F (37.2 C) (Oral)  Resp 16  Ht 5\' 2"  (1.575 m)  Wt 77.111 kg  BMI 31.09 kg/m2  SpO2 100%  LMP 11/01/2015 Physical Exam CONSTITUTIONAL: Well developed/well nourished HEAD: Normocephalic/atraumatic EYES: EOMI/PERRL ENMT: Mucous membranes moist NECK: supple no meningeal signs SPINE/BACK:entire spine nontender CV: S1/S2 noted, no murmurs/rubs/gallops noted LUNGS: Lungs are clear to auscultation bilaterally, no apparent distress ABDOMEN: soft, nontender, no rebound or guarding, bowel sounds noted throughout abdomen GU:no cva tenderness NEURO: Pt is awake/alert/appropriate, moves all extremitiesx4.  No facial droop.   EXTREMITIES: pulses normal/equal, full ROM, no calf tenderness or edema SKIN: warm, color normal PSYCH: no abnormalities of mood noted, alert and oriented to situation  ED Course  Procedures  Medications  ondansetron (ZOFRAN-ODT) disintegrating tablet 8 mg (8 mg Oral Given 11/13/15 1347)  acetaminophen (TYLENOL) tablet 650 mg (650 mg Oral Given 11/13/15 1347)     EKG Interpretation   Date/Time:  Friday November 13 2015 13:08:19 EDT Ventricular Rate:  90 PR Interval:  167 QRS Duration: 87 QT Interval:  350 QTC Calculation: 428 R Axis:   -56 Text Interpretation:  Sinus rhythm Left anterior fascicular block No  significant change since last tracing Confirmed by Bebe Shaggy  MD, Dorinda Hill  [phone]) on 11/13/2015 1:38:27 PM     Pt stable She has had extensive workup recently including negative cardiac cath/echo Recent negative CT abd/pelvis She is not pregnant by recent labs No EKG changes No focal abd tenderness on my exam Vitals appropriate No hypoxia to suggest acute PE D/c home, needs close outpatient followup for her pain needs  MDM   Final diagnoses:  None    Nursing notes including past medical history and social history reviewed and considered in documentation Previous records reviewed and considered     Zadie Rhine, MD 11/13/15  1352

## 2015-11-14 ENCOUNTER — Encounter (HOSPITAL_COMMUNITY): Payer: Self-pay

## 2015-11-14 ENCOUNTER — Emergency Department (HOSPITAL_COMMUNITY): Payer: Medicaid Other

## 2015-11-14 ENCOUNTER — Emergency Department (HOSPITAL_COMMUNITY)
Admission: EM | Admit: 2015-11-14 | Discharge: 2015-11-15 | Disposition: A | Discharge status: Other Acute Inpatient Hospital | Payer: Medicaid Other | Attending: Emergency Medicine | Admitting: Emergency Medicine

## 2015-11-14 DIAGNOSIS — R079 Chest pain, unspecified: Secondary | ICD-10-CM | POA: Insufficient documentation

## 2015-11-14 DIAGNOSIS — E119 Type 2 diabetes mellitus without complications: Secondary | ICD-10-CM | POA: Insufficient documentation

## 2015-11-14 DIAGNOSIS — Z79899 Other long term (current) drug therapy: Secondary | ICD-10-CM | POA: Diagnosis not present

## 2015-11-14 DIAGNOSIS — F17210 Nicotine dependence, cigarettes, uncomplicated: Secondary | ICD-10-CM | POA: Diagnosis not present

## 2015-11-14 DIAGNOSIS — F419 Anxiety disorder, unspecified: Secondary | ICD-10-CM | POA: Insufficient documentation

## 2015-11-14 DIAGNOSIS — Z880 Allergy status to penicillin: Secondary | ICD-10-CM | POA: Diagnosis not present

## 2015-11-14 DIAGNOSIS — I252 Old myocardial infarction: Secondary | ICD-10-CM | POA: Insufficient documentation

## 2015-11-14 DIAGNOSIS — I10 Essential (primary) hypertension: Secondary | ICD-10-CM | POA: Insufficient documentation

## 2015-11-14 LAB — BASIC METABOLIC PANEL
ANION GAP: 10 (ref 5–15)
BUN: <5 mg/dL — ABNORMAL LOW (ref 6–20)
CHLORIDE: 108 mmol/L (ref 101–111)
CO2: 22 mmol/L (ref 22–32)
Calcium: 8.9 mg/dL (ref 8.9–10.3)
Creatinine, Ser: 0.85 mg/dL (ref 0.44–1.00)
GFR calc non Af Amer: >60 mL/min (ref >60)
Glucose, Bld: 102 mg/dL — ABNORMAL HIGH (ref 65–99)
POTASSIUM: 4 mmol/L (ref 3.5–5.1)
SODIUM: 140 mmol/L (ref 135–145)

## 2015-11-14 LAB — I-STAT TROPONIN, ED
TROPONIN I, POC: 0 ng/mL (ref 0.00–0.08)
Troponin i, poc: 0 ng/mL (ref 0.00–0.08)

## 2015-11-14 LAB — CBC
HEMATOCRIT: 44 % (ref 36.0–46.0)
HEMOGLOBIN: 13.8 g/dL (ref 12.0–15.0)
MCH: 29.9 pg (ref 26.0–34.0)
MCHC: 31.4 g/dL (ref 30.0–36.0)
MCV: 95.2 fL (ref 78.0–100.0)
Platelets: 216 10*3/uL (ref 150–400)
RBC: 4.62 MIL/uL (ref 3.87–5.11)
RDW: 13.9 % (ref 11.5–15.5)
WBC: 11.8 10*3/uL — AB (ref 4.0–10.5)

## 2015-11-14 LAB — D-DIMER, QUANTITATIVE (NOT AT ARMC)

## 2015-11-14 MED ORDER — ONDANSETRON 4 MG PO TBDP
4.0000 mg | ORAL_TABLET | Freq: Once | ORAL | Status: AC
  Administered 2015-11-14: 4 mg via ORAL
  Filled 2015-11-14: qty 1

## 2015-11-14 MED ORDER — ACETAMINOPHEN 325 MG PO TABS
650.0000 mg | ORAL_TABLET | Freq: Once | ORAL | Status: AC
  Administered 2015-11-14: 650 mg via ORAL
  Filled 2015-11-14: qty 2

## 2015-11-14 MED ORDER — LORAZEPAM 1 MG PO TABS
1.0000 mg | ORAL_TABLET | Freq: Once | ORAL | Status: AC
  Administered 2015-11-14: 1 mg via ORAL
  Filled 2015-11-14: qty 1

## 2015-11-14 MED ORDER — HYDROCODONE-ACETAMINOPHEN 5-325 MG PO TABS
1.0000 | ORAL_TABLET | Freq: Once | ORAL | Status: AC
  Administered 2015-11-14: 1 via ORAL
  Filled 2015-11-14: qty 1

## 2015-11-14 MED ORDER — IBUPROFEN 400 MG PO TABS
600.0000 mg | ORAL_TABLET | Freq: Once | ORAL | Status: DC
  Filled 2015-11-14: qty 1
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
## 2015-11-14 NOTE — ED Notes (Signed)
Pt ambulated to the bathroom independently without difficulty.

## 2015-11-14 NOTE — ED Notes (Signed)
TTS at bedside. 

## 2015-11-14 NOTE — ED Notes (Signed)
Pt. Started having chest pain this am, lt. Side chest ,sharp radiates into her back, rt, arm .  She has nausea .  She was given 2 Nitro tabs, they did not help.   She had this chest pain last week with elevated troponins,  She was cathed last Friday and it was clean.  Skin is warm and dry.  Pt. Is alert and oriented X3

## 2015-11-14 NOTE — ED Notes (Signed)
Left a message For the On-Call Social Worker With pt.s information and doctors order for resources for mental health for pt.  The number I called was [phone]

## 2015-11-14 NOTE — ED Provider Notes (Signed)
CSN: [id number]     Arrival date & time 11/14/15  1329 History   First MD Initiated Contact with Patient 11/14/15 1354     Chief Complaint  Patient presents with  . Chest Pain   HPI  Melissa Butler is a 40 year old female with PMHx of DM, HTN, anxiety and MI the chest pain. Onset of symptoms was approximately 9 AM this morning. She states that she is getting dressed for work when the chest pain began acutely. Pain is described as a stabbing in the middle of her chest. She states it radiates to her back. When asked about exacerbating factors, patient states "everything makes it worse". Denies alleviating factors. She was given 2 nitros with EMS that she reports dulled off the pain a bit. She reports associated nausea, one episode of vomiting, diaphoresis and headache. She has not taken anything for her symptoms. She states "this feels like when I had my heart attack last week". Also complaining of anxiety. She states that she is out of her Ativan and can tell that she is becoming increasingly anxious. No other complaints today  Chart review: She is admitted on 4/13 with hypertension, chest pain and an elevated troponin. Patient had cardiac catheterization during this hospital stay with normal coronary arteries. Patient has had 4 emergency department visits in the past 9 days for chest pain.   Past Medical History  Diagnosis Date  . Diabetes mellitus without complication (HCC)   . Hypertension   . Anxiety   . MI (myocardial infarction) [facility])    Past Surgical History  Procedure Laterality Date  . Cesarean section    . Cardiac catheterization N/A 11/06/2015    Procedure: Left Heart Cath and Coronary Angiography;  Surgeon: Thurmon Fair, MD;  Location: [facility] INVAS[facility] CV LAB;  Service: Cardiovascular;  Laterality: N/A;   No family history on file. Social History  Substance Use Topics  . Smoking status: Current Some Day Smoker -- 0.50 packs/day    Types: Cigarettes  . Smokeless tobacco: None  .  Alcohol Use: No   OB History    Gravida Para Term Preterm AB TAB SAB Ectopic Multiple Living            2     Review of Systems  All other systems reviewed and are negative.     Allergies  Ciprofloxacin; Penicillins; Amoxicillin; Codeine; Metronidazole; Sulfa antibiotics; and Toradol  Home Medications   Prior to Admission medications   Medication Sig Start Date End Date Taking? Authorizing Provider  acetaminophen (TYLENOL) 325 MG tablet Take 2 tablets (650 mg total) by mouth every 4 (four) hours as needed for headache or mild pain. 11/06/15  Yes Luke K Kilroy, PA-C  albuterol (PROAIR HFA) 108 (90 Base) MCG/ACT inhaler Inhale 2 puffs into the lungs every 6 (six) hours as needed for wheezing or shortness of breath.   Yes Historical Provider, MD  clindamycin (CLEOCIN) 300 MG capsule Take 1 capsule (300 mg total) by mouth 2 (two) times daily. X 7 days 11/11/15  Yes Mancel Bale, MD  DULoxetine (CYMBALTA) 60 MG capsule Take 60 mg by mouth daily.   Yes Historical Provider, MD  LORazepam (ATIVAN) 1 MG tablet Take 1 tablet (1 mg total) by mouth every 8 (eight) hours as needed for anxiety. 11/01/15  Yes Bethann Berkshire, MD  metoprolol tartrate (LOPRESSOR) 25 MG tablet Take 25 mg by mouth 2 (two) times daily.   Yes Historical Provider, MD  ranitidine (ZANTAC) 150 MG tablet Take  1 tablet (150 mg total) by mouth 2 (two) times daily. 11/01/15  Yes Bethann Berkshire, MD  dicyclomine (BENTYL) 20 MG tablet Take 1 tablet (20 mg total) by mouth 4 (four) times daily -  before meals and at bedtime. Patient not taking: Reported on 11/13/2015 11/12/15   Shaune Pollack, MD  naproxen (NAPROSYN) 500 MG tablet Take 1 tablet (500 mg total) by mouth 2 (two) times daily with a meal. Patient not taking: Reported on 11/13/2015 11/10/15   Abram Sander, MD  traMADol (ULTRAM) 50 MG tablet Take 1 tablet (50 mg total) by mouth every 6 (six) hours as needed. Patient not taking: Reported on 11/13/2015 11/01/15   Bethann Berkshire, MD   BP  136/83 mmHg  Pulse 73  Temp(Src) 98.5 F (36.9 C) (Oral)  Resp 12  SpO2 99%  LMP 11/01/2015 Physical Exam  Constitutional: She appears well-developed and well-nourished. No distress.  HENT:  Head: Normocephalic and atraumatic.  Mouth/Throat: Oropharynx is clear and moist.  Eyes: Conjunctivae are normal. Pupils are equal, round, and reactive to light. Right eye exhibits no discharge. Left eye exhibits no discharge. No scleral icterus.  Neck: Normal range of motion. Neck supple.  Cardiovascular: Normal rate, regular rhythm and normal heart sounds.   No murmur heard. Pulmonary/Chest: Effort normal and breath sounds normal. No respiratory distress. She has no wheezes. She has no rales.  Abdominal: Soft. She exhibits no distension. There is no tenderness.  Musculoskeletal: Normal range of motion.  Neurological: She is alert. Coordination normal.  Skin: Skin is warm and dry.  Psychiatric: She has a normal mood and affect. Her behavior is normal.  Nursing note and vitals reviewed.   ED Course  Procedures (including critical care time) Labs Review Labs Reviewed  BASIC METABOLIC PANEL - Abnormal; Notable for the following:    Glucose, Bld 102 (*)    BUN <5 (*)    All other components within normal limits  CBC - Abnormal; Notable for the following:    WBC 11.8 (*)    All other components within normal limits  D-DIMER, QUANTITATIVE (NOT AT [facility])  I-STAT TROPOININ, ED  I-STAT TROPOININ, ED  Rosezena Sensor, ED    Imaging Review Dg Chest 2 View  11/14/2015  CLINICAL DATA:  Chest pain today, radiating down right side. Shortness of breath. Low grade fever. Pt states she had a heart attack last week. Hx asthma, diabetes, HTN, smoker EXAM: CHEST  2 VIEW COMPARISON:  11/12/2015 FINDINGS: The heart size and mediastinal contours are within normal limits. Both lungs are clear. No pleural effusion or pneumothorax. The visualized skeletal structures are unremarkable. IMPRESSION: No active  cardiopulmonary disease. Electronically Signed   By: Amie Portland M.D.   On: 11/14/2015 14:26   I have personally reviewed and evaluated these images and lab results as part of my medical decision-making.   EKG Interpretation   Date/Time:  Saturday November 14 2015 13:41:57 EDT Ventricular Rate:  79 PR Interval:  154 QRS Duration: 87 QT Interval:  385 QTC Calculation: 441 R Axis:   -57 Text Interpretation:  Sinus rhythm Probable left atrial enlargement Left  anterior fascicular block No significant change since last tracing  Confirmed by LIU MD, DANA ([id number]) on 11/14/2015 1:54:59 PM      MDM   Final diagnoses:  Chest pain, unspecified chest pain type   40 year old female presenting with chest pain 1 day. Chart review shows patient had elevated troponin approximately 10 days ago and underwent extensive cardiac workup  without acute findings. Patient has had 4 emergency department visits for same complaint since discharge from hospital afebrile, hemodynamically stable, no tachycardia and no hypoxia. Patient is nontoxic-appearing but does seem anxious. Heart regular rate and rhythm. Lungs clear to auscultation bilaterally. Slight leukocytosis at 11.8 which appears stable from last 4 emergency department visits. Remaining lab workup unremarkable. Troponin 0.00 with a nonischemic EKG. Chest x-ray without acute abnormality. Asian requesting Ativan for her anxiety. Patient's symptoms do seem to be related to her anxiety. However will get a d-dimer and delta troponin. D dimer negative. Pt is extremely anxious and concerned about not having PCP follow up. Will consult social work to talk with patient and give outpatient resources for counseling. Patient care signed out to oncoming provider, Dr. Cyndie Chime, who will follow her delta trop and anticipate discharge with appropriate outpatient follow up.     Alveta Heimlich, PA-C 11/14/15 1720  Lavera Guise, MD 11/14/15 1925  Lavera Guise, MD 11/14/15  [phone]

## 2015-11-15 ENCOUNTER — Inpatient Hospital Stay (HOSPITAL_COMMUNITY)
Admission: AD | Admit: 2015-11-15 | Discharge: 2015-11-19 | DRG: 885 | Disposition: A | Discharge status: Home / Self Care | Payer: Federal, State, Local not specified - Other | Source: Intra-hospital | Attending: Psychiatry | Admitting: Psychiatry

## 2015-11-15 ENCOUNTER — Encounter (HOSPITAL_COMMUNITY): Payer: Self-pay

## 2015-11-15 DIAGNOSIS — Z23 Encounter for immunization: Secondary | ICD-10-CM | POA: Diagnosis not present

## 2015-11-15 DIAGNOSIS — F332 Major depressive disorder, recurrent severe without psychotic features: Principal | ICD-10-CM | POA: Diagnosis present

## 2015-11-15 DIAGNOSIS — F17210 Nicotine dependence, cigarettes, uncomplicated: Secondary | ICD-10-CM | POA: Diagnosis present

## 2015-11-15 DIAGNOSIS — G4700 Insomnia, unspecified: Secondary | ICD-10-CM | POA: Diagnosis present

## 2015-11-15 DIAGNOSIS — I10 Essential (primary) hypertension: Secondary | ICD-10-CM | POA: Diagnosis present

## 2015-11-15 DIAGNOSIS — E119 Type 2 diabetes mellitus without complications: Secondary | ICD-10-CM | POA: Diagnosis present

## 2015-11-15 DIAGNOSIS — F419 Anxiety disorder, unspecified: Secondary | ICD-10-CM | POA: Diagnosis present

## 2015-11-15 DIAGNOSIS — I214 Non-ST elevation (NSTEMI) myocardial infarction: Secondary | ICD-10-CM | POA: Diagnosis present

## 2015-11-15 DIAGNOSIS — K5900 Constipation, unspecified: Secondary | ICD-10-CM | POA: Diagnosis present

## 2015-11-15 MED ORDER — LORAZEPAM 1 MG PO TABS: 1.0000 mg | ORAL_TABLET | Freq: Three times a day (TID) | ORAL | Status: DC | PRN

## 2015-11-15 MED ORDER — HYDROXYZINE HCL 50 MG PO TABS: 50.0000 mg | ORAL_TABLET | Freq: Four times a day (QID) | ORAL | Status: DC | PRN

## 2015-11-15 MED ORDER — LORAZEPAM 1 MG PO TABS
1.0000 mg | ORAL_TABLET | Freq: Three times a day (TID) | ORAL | Status: DC | PRN
  Administered 2015-11-16: 1 mg via ORAL
  Filled 2015-11-15: qty 1

## 2015-11-15 MED ORDER — HYDROXYZINE HCL 50 MG PO TABS
50.0000 mg | ORAL_TABLET | Freq: Once | ORAL | Status: AC
  Administered 2015-11-15: 50 mg via ORAL
  Filled 2015-11-15 (×2): qty 1

## 2015-11-15 MED ORDER — HYDROXYZINE HCL 50 MG PO TABS
50.0000 mg | ORAL_TABLET | ORAL | Status: DC | PRN
  Administered 2015-11-15: 50 mg via ORAL
  Filled 2015-11-15: qty 1

## 2015-11-15 MED ORDER — FAMOTIDINE 20 MG PO TABS
20.0000 mg | ORAL_TABLET | Freq: Two times a day (BID) | ORAL | Status: DC
  Administered 2015-11-15 – 2015-11-16 (×3): 20 mg via ORAL
  Filled 2015-11-15 (×7): qty 1

## 2015-11-15 MED ORDER — DULOXETINE HCL 60 MG PO CPEP
60.0000 mg | ORAL_CAPSULE | Freq: Every day | ORAL | Status: DC
  Administered 2015-11-15 – 2015-11-16 (×2): 60 mg via ORAL
  Filled 2015-11-15 (×3): qty 1

## 2015-11-15 MED ORDER — METOPROLOL TARTRATE 25 MG PO TABS
25.0000 mg | ORAL_TABLET | Freq: Two times a day (BID) | ORAL | Status: DC
  Administered 2015-11-15 – 2015-11-19 (×9): 25 mg via ORAL
  Filled 2015-11-15 (×9): qty 1
  Filled 2015-11-15 (×2): qty 14
  Filled 2015-11-15 (×3): qty 1

## 2015-11-15 MED ORDER — ACETAMINOPHEN 325 MG PO TABS
650.0000 mg | ORAL_TABLET | Freq: Four times a day (QID) | ORAL | Status: DC | PRN
  Administered 2015-11-16 – 2015-11-18 (×4): 650 mg via ORAL
  Filled 2015-11-15 (×4): qty 2

## 2015-11-15 MED ORDER — LORAZEPAM 0.5 MG PO TABS
0.5000 mg | ORAL_TABLET | Freq: Once | ORAL | Status: AC
  Administered 2015-11-15: 0.5 mg via ORAL

## 2015-11-15 MED ORDER — LORAZEPAM 0.5 MG PO TABS
ORAL_TABLET | ORAL | Status: AC
  Filled 2015-11-15: qty 1

## 2015-11-15 MED ORDER — HYDROXYZINE HCL 50 MG PO TABS
50.0000 mg | ORAL_TABLET | Freq: Every evening | ORAL | Status: DC | PRN
  Administered 2015-11-15: 50 mg via ORAL
  Filled 2015-11-15: qty 1
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
## 2015-11-15 NOTE — [facility] Group Notes (Signed)
[facility] LCSW Group Therapy Note  11/15/2015 /10 AM  Type of Therapy and Topic:  Group Therapy: Feelings about Discharge and Establishing a Support System  Participation Level:  Did not attend; patient appeared drowsy when invited to attend group. Later learned pt had arrived in early morning hours and had received medication.    Carney Bern, LCSW

## 2015-11-15 NOTE — Tx Team (Signed)
Initial Interdisciplinary Treatment Plan   PATIENT : Loss of ex husband last September   PATIENT STRENGTHS: Ability for insight Motivation for treatment/growth Supportive family/friends   PROBLEM LIST: Problem List/Patient Goals Date to be addressed Date deferred Reason deferred Estimated date of resolution  Depression 11/15/2015     Anxiety 11/15/2015                 "How to cope with my anxiety.      "How to not worry and stress all the time.                         DISCHARGE CRITERIA:  Improved stabilization in mood, thinking, and/or behavior  PRELIMINARY DISCHARGE PLAN: Return to previous living arrangement  PATIENT/FAMIILY INVOLVEMENT: This treatment plan has been presented to and reviewed with the patient, Melissa Butler, and/or family me.  The patient and family have been given the opportunity to ask questions and make suggestions.  [name] 11/15/2015, 3:33 AM

## 2015-11-15 NOTE — Progress Notes (Signed)
Adult Psychoeducational Group Note  Date:  11/15/2015 Time:  9:33 PM  Group Topic/Focus:  Wrap-Up Group:   The focus of this group is to help patients review their daily goal of treatment and discuss progress on daily workbooks.  Participation Level:  Active  Participation Quality:  Appropriate and Attentive  Affect:  Appropriate  Cognitive:  Appropriate  Insight: Appropriate and Good  Engagement in Group:  Engaged  Modes of Intervention:  Discussion  Additional Comments:  Pt rated her day a 6 out of 10. Pt mentioned she has met good people and they picked each other up. Pt goal for tonight is to get some rest and to start new life tomorrow.   Melissa Butler 11/15/2015, 9:33 PM

## 2015-11-15 NOTE — Progress Notes (Signed)
Melissa Butler is having a hard time today. Melissa Butler says things like " do you know what's wrong with me? Did I have a nervous breakdown? Am I dying?" Melissa Butler completes her daily assessment and on it Melissa Butler wrote Melissa Butler deneid SI today  And Melissa Butler rated her depression, hopelessness and anxiety  " 03/01/09", respectively. Melissa Butler is offered pos encouragement, trauma regarding MI discussed and processed with her. Pt is encouraged to share her feelings of fear and vulnerability with care team. R Safety in place.

## 2015-11-15 NOTE — ED Notes (Signed)
Clothing, cell phone, pocketbook, clothing sent with [place], pt departing to [facility] now.

## 2015-11-15 NOTE — BHH Counselor (Addendum)
Adult Comprehensive Assessment  Patient ID: Melissa Butler, female   DOB: [birth year], 40 y.o.   MRN: [id number]  Information Source: Information source: Patient  Current Stressors:  Educational / Learning stressors: None Employment / Job issues: None Family Relationships: Patient reports stress between herself and her mother. "We have a lot to deal with. We have to raise my son by ourselves" Financial / Lack of resources (include bankruptcy): Patient reports a lot of financial stress, as she is not currently working. "The only people bringing in income is mom and my aunt. Their on fixed incomes." Housing / Lack of housing: Patient currently lives with her mother and aunt Physical health (include injuries & life threatening diseases): Patient reports having a heart attack last Thursday. Has an appt with cardiologist on May 30th. Social relationships: Patient denies any stressors Substance abuse: Patient denies Bereavement / Loss: Patient denies  Living/Environment/Situation:  Living Arrangements: Parent, Other relatives Living conditions (as described by patient or guardian): Patient lives in a trailer with mother and aunt.  How long has patient lived in current situation?: 2 years  What is atmosphere in current home: Chaotic  Family History:  Marital status: Divorced Divorced, when?: 2014 What types of issues is patient dealing with in the relationship?: Patient reports that ex-husband was being unfaithful. Are you sexually active?: No Does patient have children?: Yes How many children?: 2 How is patient's relationship with their children?: Patient reports a positive relationship with her children.   Childhood History:  By whom was/is the patient raised?: Both parents Description of patient's relationship with caregiver when they were a child: Patient reports a good relationship with parents during childhood.  Patient's description of current relationship with people who raised  him/her: Patient reports a good relationship with mother. Father passed away when she was 24 years old from cancer. How were you disciplined when you got in trouble as a child/adolescent?: Mother Does patient have siblings?: Yes Number of Siblings: 1 Description of patient's current relationship with siblings: Patient reports that she does not talk to her sister.  Did patient suffer any verbal/emotional/physical/sexual abuse as a child?: No Did patient suffer from severe childhood neglect?: No Has patient ever been sexually abused/assaulted/raped as an adolescent or adult?: No Was the patient ever a victim of a crime or a disaster?: No Witnessed domestic violence?: No Has patient been effected by domestic violence as an adult?: No  Education:  Highest grade of school patient has completed: Automotive engineer Currently a Consulting civil engineer?: No Learning disability?: No  Employment/Work Situation:   Employment situation: Unemployed What is the longest time patient has a held a job?: 9 years  Where was the patient employed at that time?: 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" Has patient ever been in the [employer]?: No Has patient ever served in combat?: No Did You Receive Any Psychiatric Treatment/Services While in Equities trader?: No Are There Guns or Other Weapons in Your Home?: No Are These Comptroller?:  (N/A)  Financial Resources:   Financial resources: No income Does patient have a Lawyer or guardian?: No  Alcohol/Substance Abuse:   What has been your use of drugs/alcohol within the last 12 months?: None If attempted suicide, did drugs/alcohol play a role in this?: No Alcohol/Substance Abuse Treatment Hx: Denies past history Has alcohol/substance abuse ever caused legal problems?: No  Social Support System:   Patient's Community Support System: Good Describe Community Support System: Patient reports her mother and aunt are supports for her. Type of  faith/religion:  None How does patient's faith help to cope with current illness?: N/A  Leisure/Recreation:   Leisure and Hobbies: Patient reports she enjoys cooking,cleaning, and hunting.   Strengths/Needs:   What things does the patient do well?: Singing  In what areas does patient struggle / problems for patient: Patient states "My nerves and procrastinating".  Discharge Plan:   Does patient have access to transportation?: Yes Will patient be returning to same living situation after discharge?: Yes Currently receiving community mental health services: No If no, would patient like referral for services when discharged?: Yes (What county?) [facility] Does patient have financial barriers related to discharge medications?: Yes Patient description of barriers related to discharge medications: Patient does not have income at this time. May need assistance with getting medications upon discharge.   Summary/Recommendations:   Summary and Recommendations (to be completed by the evaluator): Patient is a 40 year old female with a diagnosis of Major Depressive Disorder . Pt presented to the hospital with feelings of worthlessness and depression. Pt reports primary trigger(s) for admission was depression. Patient will benefit from crisis stabilization, medication evaluation, group therapy and psycho education in addition to case management for discharge planning. At discharge, it is recommended that Pt remain compliant with established discharge plan and continued treatment.   Patient reports that she has transportation at discharge and would like referral for outpatient services in [facility].    PICKETT JR, Juelz Whittenberg C. 11/15/2015

## 2015-11-15 NOTE — H&P (Signed)
Psychiatric Admission Assessment Adult  Patient Identification: Melissa Butler MRN:  [id number] Date of Evaluation:  11/15/2015 Chief Complaint:  mdd,rec,sev Principal Diagnosis: MDD (major depressive disorder), recurrent severe, without psychosis ([place]) Diagnosis:   Patient Active Problem List   Diagnosis Date Noted  . MDD (major depressive disorder), recurrent severe, without psychosis ([place]) [F33.2] 11/15/2015  . NSTEMI (non-ST elevated myocardial infarction) ([place]) [I21.4] 11/06/2015  . Anxiety [F41.9] 11/06/2015  . Normal coronary arteries 11/06/15 [Z03.89] 11/06/2015  . ACS (acute coronary syndrome) ([place]) [I24.9] 11/05/2015  . Chest pain [R07.9] 11/05/2015   History of Present Illness:: Melissa Butler 40 yr old white female admitted to [facility] after brief hospitalization of MI. Patient states that for past several weeks her anxiety was worsening; states that she worries about everything.  "I worry about my kids; if they are going to be okay; I worry about losing people; I worry that I'm not good enough.  I always feel closed in like I'm in a bubble."  Patient states that she lives with her mother and her 59 yr old son.  States that she in unemployed related to not having transportation to get to a job.  At this time denies suicidal/homicidal ideation; psychosis, and paranoia.   Associated Signs/Symptoms: Depression Symptoms:  depressed mood, insomnia, difficulty concentrating, hopelessness, (Hypo) Manic Symptoms:  Distractibility, Anxiety Symptoms:  Excessive Worry, Psychotic Symptoms:  Denies PTSD Symptoms: Denies Total Time spent with patient: 45 minutes  Past Psychiatric History: Denies prior history of inpatient psych hospitalization; no prior history of suicide attempt or self injury.  States that she has never had outpatient services but has been treated for depression by her primary physician   Is the patient at risk to self? No.  Has the patient been a risk to self in the past  6 months? No.  Has the patient been a risk to self within the distant past? No.  Is the patient a risk to others? No.  Has the patient been a risk to others in the past 6 months? No.  Has the patient been a risk to others within the distant past? No.   Prior Inpatient Therapy:   Prior Outpatient Therapy:    Alcohol Screening: 1. How often do you have a drink containing alcohol?: Never 9. Have you or someone else been injured as a result of your drinking?: No 10. Has a relative or friend or a doctor or another health worker been concerned about your drinking or suggested you cut down?: No Alcohol Use Disorder Identification Test Final Score (AUDIT): 0 Brief Intervention: Patient declined brief intervention Substance Abuse History in the last 12 months:  No. Consequences of Substance Abuse: NA Previous Psychotropic Medications: Yes  Psychological Evaluations: No  Past Medical History:  Past Medical History  Diagnosis Date  . Diabetes mellitus without complication ([place])   . Hypertension   . Anxiety   . MI (myocardial infarction) [facility])     Past Surgical History  Procedure Laterality Date  . Cesarean section    . Cardiac catheterization N/A 11/06/2015    Procedure: Left Heart Cath and Coronary Angiography;  Surgeon: Sanda Klein, MD;  Location: [place] CV LAB;  Service: Cardiovascular;  Laterality: N/A;   Family History: History reviewed. No pertinent family history. Family Psychiatric  History: Denies Tobacco Screening: Denies Social History:  History  Alcohol Use No     History  Drug Use No    Additional Social History: Marital status: Divorced Divorced, when?: 2014 What types  of issues is patient dealing with in the relationship?: Patient reports that ex-husband was being unfaithful Are you sexually active?: No Does patient have children?: Yes How many children?: 2 How is patient's relationship with their children?: Patient reports a positive relationship with her  children.   Allergies:   Allergies  Allergen Reactions  . Ciprofloxacin Anaphylaxis, Hives and Rash  . Penicillins Anaphylaxis, Hives, Shortness Of Breath, Swelling and Rash    Has patient had a PCN reaction causing immediate rash, facial/tongue/throat swelling, SOB or lightheadedness with hypotension: Yes Has patient had a PCN reaction causing severe rash involving mucus membranes or skin necrosis: No Has patient had a PCN reaction that required hospitalization No Has patient had a PCN reaction occurring within the last 10 years: No If all of the above answers are "NO", then may proceed with Cephalosporin use.   Marland Kitchen Amoxicillin   . Codeine Nausea And Vomiting  . Metronidazole     Other reaction(s): Other - See Comments "All jittery and burning"  . Sulfa Antibiotics   . Toradol [Ketorolac Tromethamine]     anaphalaxis    Lab Results:  Results for orders placed or performed during the hospital encounter of 11/14/15 (from the past 48 hour(s))  I-stat troponin, ED     Status: None   Collection Time: 11/14/15  2:04 PM  Result Value Ref Range   Troponin i, poc 0.00 0.00 - 0.08 ng/mL   Comment 3            Comment: Due to the release kinetics of cTnI, a negative result within the first hours of the onset of symptoms does not rule out myocardial infarction with certainty. If myocardial infarction is still suspected, repeat the test at appropriate intervals.   Basic metabolic panel     Status: Abnormal   Collection Time: 11/14/15  2:09 PM  Result Value Ref Range   Sodium 140 135 - 145 mmol/L   Potassium 4.0 3.5 - 5.1 mmol/L   Chloride 108 101 - 111 mmol/L   CO2 22 22 - 32 mmol/L   Glucose, Bld 102 (H) 65 - 99 mg/dL   BUN <5 (L) 6 - 20 mg/dL   Creatinine, Ser 0.85 0.44 - 1.00 mg/dL   Calcium 8.9 8.9 - 10.3 mg/dL   GFR calc non Af Amer >60 >60 mL/min   GFR calc Af Amer >60 >60 mL/min    Comment: (NOTE) The eGFR has been calculated using the CKD EPI equation. This calculation  has not been validated in all clinical situations. eGFR's persistently <60 mL/min signify possible Chronic Kidney Disease.    Anion gap 10 5 - 15  CBC     Status: Abnormal   Collection Time: 11/14/15  2:09 PM  Result Value Ref Range   WBC 11.8 (H) 4.0 - 10.5 K/uL   RBC 4.62 3.87 - 5.11 MIL/uL   Hemoglobin 13.8 12.0 - 15.0 g/dL   HCT 44.0 36.0 - 46.0 %   MCV 95.2 78.0 - 100.0 fL   MCH 29.9 26.0 - 34.0 pg   MCHC 31.4 30.0 - 36.0 g/dL   RDW 13.9 11.5 - 15.5 %   Platelets 216 150 - 400 K/uL  D-dimer, quantitative (not at [facility])     Status: None   Collection Time: 11/14/15  3:47 PM  Result Value Ref Range   D-Dimer, Quant <0.27 0.00 - 0.50 ug/mL-FEU    Comment: (NOTE) At the manufacturer cut-off of 0.50 ug/mL FEU, this assay has been  documented to exclude PE with a sensitivity and negative predictive value of 97 to 99%.  At this time, this assay has not been approved by the FDA to exclude DVT/VTE. Results should be correlated with clinical presentation.   I-Stat Troponin, ED (not at [facility])     Status: None   Collection Time: 11/14/15  5:23 PM  Result Value Ref Range   Troponin i, poc 0.00 0.00 - 0.08 ng/mL   Comment 3            Comment: Due to the release kinetics of cTnI, a negative result within the first hours of the onset of symptoms does not rule out myocardial infarction with certainty. If myocardial infarction is still suspected, repeat the test at appropriate intervals.     Blood Alcohol level:  Lab Results  Component Value Date   ETH <5 96/78/9381    Metabolic Disorder Labs:  Lab Results  Component Value Date   HGBA1C 5.6 11/06/2015   MPG 114 11/06/2015   No results found for: PROLACTIN Lab Results  Component Value Date   CHOL 139 11/06/2015   TRIG 181* 11/06/2015   HDL 31* 11/06/2015   CHOLHDL 4.5 11/06/2015   VLDL 36 11/06/2015   LDLCALC 72 11/06/2015    Current Medications: Current Facility-Administered Medications  Medication Dose Route  Frequency Provider Last Rate Last Dose  . acetaminophen (TYLENOL) tablet 650 mg  650 mg Oral Q6H PRN Lurena Nida, NP      . DULoxetine (CYMBALTA) DR capsule 60 mg  60 mg Oral Daily Lurena Nida, NP   60 mg at 11/15/15 0903  . famotidine (PEPCID) tablet 20 mg  20 mg Oral BID Lurena Nida, NP   20 mg at 11/15/15 0175  . LORazepam (ATIVAN) 0.5 MG tablet        0.5 mg at 11/15/15 1348  . metoprolol tartrate (LOPRESSOR) tablet 25 mg  25 mg Oral BID Lurena Nida, NP   25 mg at 11/15/15 1025   PTA Medications: Prescriptions prior to admission  Medication Sig Dispense Refill Last Dose  . clindamycin (CLEOCIN) 300 MG capsule Take 1 capsule (300 mg total) by mouth 2 (two) times daily. X 7 days 14 capsule 0 11/15/2015 at Unknown time  . LORazepam (ATIVAN) 1 MG tablet Take 1 tablet (1 mg total) by mouth every 8 (eight) hours as needed for anxiety. 20 tablet 0 11/15/2015 at Unknown time  . acetaminophen (TYLENOL) 325 MG tablet Take 2 tablets (650 mg total) by mouth every 4 (four) hours as needed for headache or mild pain.   Unknown at Unknown time  . albuterol (PROAIR HFA) 108 (90 Base) MCG/ACT inhaler Inhale 2 puffs into the lungs every 6 (six) hours as needed for wheezing or shortness of breath.   More than a month at Unknown time  . dicyclomine (BENTYL) 20 MG tablet Take 1 tablet (20 mg total) by mouth 4 (four) times daily -  before meals and at bedtime. (Patient not taking: Reported on 11/13/2015) 40 tablet 0 Unknown at Unknown time  . DULoxetine (CYMBALTA) 60 MG capsule Take 60 mg by mouth daily.   Unknown at Unknown time  . metoprolol tartrate (LOPRESSOR) 25 MG tablet Take 25 mg by mouth 2 (two) times daily.   Unknown at Unknown time  . naproxen (NAPROSYN) 500 MG tablet Take 1 tablet (500 mg total) by mouth 2 (two) times daily with a meal. (Patient not taking: Reported on 11/13/2015) 30 tablet 0  Unknown at Unknown time  . ranitidine (ZANTAC) 150 MG tablet Take 1 tablet (150 mg total) by mouth 2 (two)  times daily. 60 tablet 0 Unknown at Unknown time  . traMADol (ULTRAM) 50 MG tablet Take 1 tablet (50 mg total) by mouth every 6 (six) hours as needed. (Patient not taking: Reported on 11/13/2015) 20 tablet 0 Unknown at Unknown time    Musculoskeletal: Strength & Muscle Tone: within normal limits Gait & Station: normal Patient leans: N/A  Psychiatric Specialty Exam: Physical Exam  Constitutional: She is oriented to person, place, and time.  HENT:  Head: Normocephalic.  Neck: Normal range of motion.  Respiratory: Effort normal.  Musculoskeletal: Normal range of motion.  Neurological: She is alert and oriented to person, place, and time. Coordination and gait normal.  Skin: Skin is warm and dry.  Psychiatric: Her speech is normal. Judgment normal. Her mood appears anxious. She is withdrawn. Thought content is not paranoid. Cognition and memory are normal. She exhibits a depressed mood. She expresses no homicidal and no suicidal ideation.    ROS  Blood pressure 147/96, pulse 89, temperature 98.6 F (37 C), temperature source Oral, resp. rate 20, height '5\' 2"'$  (1.575 m), weight 77.111 kg (170 lb), last menstrual period 11/01/2015.Body mass index is 31.09 kg/(m^2).  General Appearance: Casual  Eye Contact::  Good  Speech:  Clear and Coherent and Normal Rate  Volume:  Normal  Mood:  Anxious and Depressed  Affect:  Depressed  Thought Process:  Circumstantial  Orientation:  Full (Time, Place, and Person)  Thought Content:  Denies hallucinations, delusions, and paranoia  Suicidal Thoughts:  No  Homicidal Thoughts:  No  Memory:  Immediate;   Good Recent;   Good Remote;   Good  Judgement:  Fair  Insight:  Fair  Psychomotor Activity:  Normal  Concentration:  Fair  Recall:  Good  Fund of Knowledge:Good  Language: Good  Akathisia:  No  Handed:  Right  AIMS (if indicated):     Assets:  Communication Skills Desire for Improvement Housing Social Support  ADL's:  Intact  Cognition:  WNL  Sleep:  Number of Hours: 1.3     Treatment Plan Summary: Daily contact with patient to assess and evaluate symptoms and progress in treatment and Medication management  1.  2. Melissa Butler was admitted to [facility] under the service of Dr. Parke Poisson for MDD (major depressive disorder), recurrent severe, without psychosis ([place]), crisis management, and stabilization. 3. Routine labs; which include CBC, CMP, UA, ETOH, and UDS were reviewed and PRN's ordered if problem indicated; medical consultation if indicated to treat health problems.   4. During this hospital stay Melissa Butler will receive psychosocial and education assessment.  A treatment plan developed to decrease risk of relapse upon discharge and the need for readmission.  Melissa Butler will participate in group therapy.  Psychotherapy:  Psychosocial education regarding relapse prevention and self care; Social and Airline pilot; Learning based strategies; Cognitive behavioral; and family object relations individuation separation intervention psychotherapies can be considered.   5. Will maintain observation checks every 15 minutes for safety. 6. Medication Management:  Major Depression:  Will continue with Cymbalta 60 mg daily; Anxiety Will start Buspar 5 mg tid (titrate as appropriate); and Vistaril 25 mg Tid prn.    7. Monitor Melissa Butler for mood/behavioral changes; Monitor medications for adverse reactions. 8. Health care follow ups to be scheduled when indicated for medical problems at discharge 9. Social work will  consult with family for collateral information and discuss discharge and follow up plan.   I certify that inpatient services furnished can reasonably be expected to improve the patient's condition.    Rankin, Delphia Grates, NP 4/23/20172:35 PM  I agreed with the findings, treatment and disposition plan of this patient.  Berniece Andreas, MD

## 2015-11-15 NOTE — BHH Group Notes (Signed)
BHH Group Notes:  (Nursing/MHT/Case Management/Adjunct)  Date:  11/15/2015  Time:  2:42 PM  Type of Therapy:  Psychoeducational Skills  Participation Level:  Active  Participation Quality:  Appropriate  Affect:  Appropriate  Cognitive:  Appropriate  Insight:  Appropriate  Engagement in Group:  Engaged  Modes of Intervention:  Problem-solving Summary of Progress/Problems: Group encouraged to surround themselves with positive and healthy group/support system when changing to a healthy life style.       Bethann Punches 11/15/2015, 2:42 PM

## 2015-11-15 NOTE — BHH Suicide Risk Assessment (Signed)
BHH INPATIENT:  Family/Significant Other Suicide Prevention Education  Suicide Prevention Education:  Patient Refusal for Family/Significant Other Suicide Prevention Education: The patient Melissa Butler has refused to provide written consent for family/significant other to be provided Family/Significant Other Suicide Prevention Education during admission and/or prior to discharge.  Physician notified.  PICKETT JR, Arcadia Gorgas C 11/15/2015, 9:11 AM

## 2015-11-15 NOTE — ED Provider Notes (Signed)
Patient was accepted in sign out pending Presenter, broadcasting. Social work was not available today. When I went to evaluate the patient she was extremely anxious and said that this was debilitating for her she feels very depressed about the fact that she recently had "a heart attack". She did recognize that her cardiac catheterization had been normal. Because of the extent of her anxiety the decision was made to consult TTS. The patient said she felt like she needed inpatient mental health help. The patient was evaluated by TTS and was found to inpatient bed to help with treatment of her anxiety and depression.  Leta Baptist, MD 11/15/15 [phone]

## 2015-11-15 NOTE — Progress Notes (Signed)
Writer spoke with Melissa Butler and she is feeling anxious and worrying about what is going on with her. She reports that she feels like she is in a bubble and she worries because she experiences highs and lows. Writer encouraged her to try progressive breathing exercises and possibly try and be up in the dayroom more instead of isolating and thinking too much. She was receptive to try some of these options. She denies si/hi/a/v hallucinations. Writer encouraged her to speak with her doctor on tomorrow about all of these concerns. Support givne and safety maintained on unit with 15 min checks.

## 2015-11-15 NOTE — Progress Notes (Signed)
Patient admitted voluntarily d/t anxiety and feeling overwhelmed with life. She had a heart attack last week and her ex-husband died last 05/01/23. She reports problems sleeping and racing thoughts. Medical hx include htn, anxiety, MI, and diabetes. She denies si/hi/a/v hallucinations. Meal provided, oriented to unit and obtained order from NP. Safety maintained on unit with 15 min checks.

## 2015-11-16 ENCOUNTER — Other Ambulatory Visit: Payer: Self-pay

## 2015-11-16 DIAGNOSIS — F332 Major depressive disorder, recurrent severe without psychotic features: Principal | ICD-10-CM

## 2015-11-16 MED ORDER — BOOST / RESOURCE BREEZE PO LIQD
1.0000 | ORAL | Status: DC | PRN
  Filled 2015-11-16: qty 1

## 2015-11-16 MED ORDER — ONDANSETRON HCL 4 MG PO TABS
4.0000 mg | ORAL_TABLET | Freq: Three times a day (TID) | ORAL | Status: DC | PRN
  Administered 2015-11-16: 4 mg via ORAL
  Filled 2015-11-16: qty 1

## 2015-11-16 MED ORDER — MIRTAZAPINE 7.5 MG PO TABS
7.5000 mg | ORAL_TABLET | Freq: Every day | ORAL | Status: DC
  Administered 2015-11-16: 7.5 mg via ORAL
  Filled 2015-11-16 (×2): qty 1

## 2015-11-16 MED ORDER — DULOXETINE HCL 30 MG PO CPEP
30.0000 mg | ORAL_CAPSULE | Freq: Every day | ORAL | Status: DC
  Filled 2015-11-16 (×2): qty 1

## 2015-11-16 MED ORDER — LORAZEPAM 0.5 MG PO TABS
0.5000 mg | ORAL_TABLET | Freq: Four times a day (QID) | ORAL | Status: DC
  Administered 2015-11-16 – 2015-11-17 (×3): 0.5 mg via ORAL
  Filled 2015-11-16 (×3): qty 1

## 2015-11-16 MED ORDER — ALUM & MAG HYDROXIDE-SIMETH 200-200-20 MG/5ML PO SUSP
30.0000 mL | Freq: Four times a day (QID) | ORAL | Status: DC | PRN
  Administered 2015-11-16 – 2015-11-19 (×4): 30 mL via ORAL
  Filled 2015-11-16 (×4): qty 30

## 2015-11-16 NOTE — Progress Notes (Signed)
Recreation Therapy Notes  Date: 04.24.2017 Time: 9:30am Location: 300 Hall Group Room   Group Topic: Stress Management  Goal Area(s) Addresses:  Patient will actively participate in stress management techniques presented during session.   Behavioral Response: Engaged, Attentive  Intervention: Stress management techniques  Activity : Deep Breathing, Mindful Breathing and Mindfulness. LRT provided education, instruction and demonstration on practice of  Deep Breathing, Mindful Breathing and Mindfulness. Patient was asked to participate in technique introduced during session.   Education:  Stress Management, Discharge Planning.   Education Outcome: Acknowledges education  Clinical Observations/Feedback: Patient actively engaged in technique introduced, expressed no concerns and demonstrated ability to practice independently post d/c.   Wilman Tucker L Brinlynn Gorton, LRT/CTRS        Elanora Quin L 11/16/2015 3:48 PM 

## 2015-11-16 NOTE — Progress Notes (Addendum)
[facility] MD Progress Note  11/16/2015 1:46 PM Melissa Butler  MRN:  [id number] Subjective:  Patient reports history of depression and also significant anxiety, with " constant worrying ". Of note, has history of recent MI, but at this time does not endorse any acute chest pain or shortness of breath. Objective : I have discussed case with treatment team and have met with patient . Patient is a 40 year old female, who lives with mother, has two children, is currently unemployed . Recent admission for cardiac event/MI. Reported history of depression and severe anxiety. At this time endorses constant  And excessive worrying , depression. Does not currently endorse active suicidal ideations, denies any psychotic symptoms and does not appear internally preoccupied . Although acknowledging some improvement, does endorse ongoing severe anxiety, and also depression, but denies current suicidal thoughts. Tends to ruminate , and staff reports she makes remarks such as whether she will ever feel well again, or whether she might be dying .  Visible on unit, no agitated or disruptive behaviors. Of note, she reports that she does not feel Cymbalta trial is working well for her and she reports some nausea from it. She is hoping to try another antidepressant .  Patient also reports vague feeling of being overmedicated, feeling " jacked up", which she is unsure if could be related to antidepressant trial. We discussed options , and she agrees to Remeron trial, which may also help sleep. She has history of good response to Paxil in the past, but we decided not to restart it at this time due to possible side effect profile .  Principal Problem: MDD (major depressive disorder), recurrent severe, without psychosis ([place]) Diagnosis:   Patient Active Problem List   Diagnosis Date Noted  . MDD (major depressive disorder), recurrent severe, without psychosis ([place]) [F33.2] 11/15/2015  . NSTEMI (non-ST elevated myocardial  infarction) ([place]) [I21.4] 11/06/2015  . Anxiety [F41.9] 11/06/2015  . Normal coronary arteries 11/06/15 [Z03.89] 11/06/2015  . ACS (acute coronary syndrome) ([place]) [I24.9] 11/05/2015  . Chest pain [R07.9] 11/05/2015   Total Time spent with patient: 25 minutes     Past Medical History:  Past Medical History  Diagnosis Date  . Diabetes mellitus without complication ([place])   . Hypertension   . Anxiety   . MI (myocardial infarction) [facility])     Past Surgical History  Procedure Laterality Date  . Cesarean section    . Cardiac catheterization N/A 11/06/2015    Procedure: Left Heart Cath and Coronary Angiography;  Surgeon: Sanda Klein, MD;  Location: [place] CV LAB;  Service: Cardiovascular;  Laterality: N/A;   Family History: History reviewed. No pertinent family history.  Social History:  History  Alcohol Use No     History  Drug Use No    Social History   Social History  . Marital Status: Single    Spouse Name: N/A  . Number of Children: N/A  . Years of Education: N/A   Social History Main Topics  . Smoking status: Current Some Day Smoker -- 0.50 packs/day    Types: Cigarettes  . Smokeless tobacco: None  . Alcohol Use: No  . Drug Use: No  . Sexual Activity: Yes    Birth Control/ Protection: Implant   Other Topics Concern  . None   Social History Narrative   Additional Social History:   Sleep: states she has difficulty sleeping due to worsening anxiety at night time .  Appetite:  Good  Current Medications: Current Facility-Administered Medications  Medication Dose Route Frequency Provider Last Rate Last Dose  . acetaminophen (TYLENOL) tablet 650 mg  650 mg Oral Q6H PRN Lurena Nida, NP   650 mg at 11/16/15 1111  . [START ON 11/17/2015] DULoxetine (CYMBALTA) DR capsule 30 mg  30 mg Oral Daily Myer Peer Cobos, MD      . famotidine (PEPCID) tablet 20 mg  20 mg Oral BID Lurena Nida, NP   20 mg at 11/16/15 0841  . feeding supplement (BOOST / RESOURCE  BREEZE) liquid 1 Container  1 Container Oral PRN Myer Peer Cobos, MD      . LORazepam (ATIVAN) tablet 1 mg  1 mg Oral Q8H PRN Lurena Nida, NP   1 mg at 11/16/15 0851  . metoprolol tartrate (LOPRESSOR) tablet 25 mg  25 mg Oral BID Lurena Nida, NP   25 mg at 11/16/15 0841  . mirtazapine (REMERON) tablet 7.5 mg  7.5 mg Oral QHS Jenne Campus, MD      . ondansetron [facility]) tablet 4 mg  4 mg Oral Q8H PRN Kerrie Buffalo, NP   4 mg at 11/16/15 1329    Lab Results:  Results for orders placed or performed during the hospital encounter of 11/14/15 (from the past 48 hour(s))  I-stat troponin, ED     Status: None   Collection Time: 11/14/15  2:04 PM  Result Value Ref Range   Troponin i, poc 0.00 0.00 - 0.08 ng/mL   Comment 3            Comment: Due to the release kinetics of cTnI, a negative result within the first hours of the onset of symptoms does not rule out myocardial infarction with certainty. If myocardial infarction is still suspected, repeat the test at appropriate intervals.   Basic metabolic panel     Status: Abnormal   Collection Time: 11/14/15  2:09 PM  Result Value Ref Range   Sodium 140 135 - 145 mmol/L   Potassium 4.0 3.5 - 5.1 mmol/L   Chloride 108 101 - 111 mmol/L   CO2 22 22 - 32 mmol/L   Glucose, Bld 102 (H) 65 - 99 mg/dL   BUN <5 (L) 6 - 20 mg/dL   Creatinine, Ser 0.85 0.44 - 1.00 mg/dL   Calcium 8.9 8.9 - 10.3 mg/dL   GFR calc non Af Amer >60 >60 mL/min   GFR calc Af Amer >60 >60 mL/min    Comment: (NOTE) The eGFR has been calculated using the CKD EPI equation. This calculation has not been validated in all clinical situations. eGFR's persistently <60 mL/min signify possible Chronic Kidney Disease.    Anion gap 10 5 - 15  CBC     Status: Abnormal   Collection Time: 11/14/15  2:09 PM  Result Value Ref Range   WBC 11.8 (H) 4.0 - 10.5 K/uL   RBC 4.62 3.87 - 5.11 MIL/uL   Hemoglobin 13.8 12.0 - 15.0 g/dL   HCT 44.0 36.0 - 46.0 %   MCV 95.2 78.0 - 100.0  fL   MCH 29.9 26.0 - 34.0 pg   MCHC 31.4 30.0 - 36.0 g/dL   RDW 13.9 11.5 - 15.5 %   Platelets 216 150 - 400 K/uL  D-dimer, quantitative (not at [facility])     Status: None   Collection Time: 11/14/15  3:47 PM  Result Value Ref Range   D-Dimer, Quant <0.27 0.00 - 0.50 ug/mL-FEU    Comment: (NOTE) At the manufacturer cut-off of  0.50 ug/mL FEU, this assay has been documented to exclude PE with a sensitivity and negative predictive value of 97 to 99%.  At this time, this assay has not been approved by the FDA to exclude DVT/VTE. Results should be correlated with clinical presentation.   I-Stat Troponin, ED (not at [facility])     Status: None   Collection Time: 11/14/15  5:23 PM  Result Value Ref Range   Troponin i, poc 0.00 0.00 - 0.08 ng/mL   Comment 3            Comment: Due to the release kinetics of cTnI, a negative result within the first hours of the onset of symptoms does not rule out myocardial infarction with certainty. If myocardial infarction is still suspected, repeat the test at appropriate intervals.     Blood Alcohol level:  Lab Results  Component Value Date   ETH <5 11/11/2015    Physical Findings: AIMS: Facial and Oral Movements Muscles of Facial Expression: None, normal Lips and Perioral Area: None, normal Jaw: None, normal Tongue: None, normal,Extremity Movements Upper (arms, wrists, hands, fingers): None, normal Lower (legs, knees, ankles, toes): None, normal, Trunk Movements Neck, shoulders, hips: None, normal, Overall Severity Severity of abnormal movements (highest score from questions above): None, normal Incapacitation due to abnormal movements: None, normal Patient's awareness of abnormal movements (rate only patient's report): No Awareness, Dental Status Current problems with teeth and/or dentures?: Yes (rt. upper and lower tooth infected, pt currently on antibiotic for infection) Does patient usually wear dentures?: No  CIWA:    COWS:      Musculoskeletal: Strength & Muscle Tone: within normal limits Gait & Station: normal Patient leans: N/A  Psychiatric Specialty Exam: ROS denies headache , at this time denies chest pain, no shortness of breath.   Blood pressure 132/86, pulse 92, temperature 98.5 F (36.9 C), temperature source Oral, resp. rate 16, height '5\' 2"'  (1.575 m), weight 170 lb (77.111 kg), last menstrual period 11/01/2015.Body mass index is 31.09 kg/(m^2).  General Appearance: Well Groomed  Engineer, water::  Good  Speech:  Normal Rate  Volume:  Normal  Mood:  depressed, anxious   Affect:  Appropriate and anxious, does smile at times appropriately   Thought Process:  Linear  Orientation:  Other:  fully alert and attentive   Thought Content:  denies hallucinations, no delusions  Suicidal Thoughts:  No- today denies any suicidal ideations, and at this time denies any self injurious ideations, contracts for safety on the unit   Homicidal Thoughts:  No  Memory:  recent and remote grossly intact   Judgement:  Fair  Insight:  Fair  Psychomotor Activity:  Normal  Concentration:  Good  Recall:  Good  Fund of Knowledge:Good  Language: Good  Akathisia:  Negative  Handed:  Right  AIMS (if indicated):     Assets:  Communication Skills Desire for Improvement Resilience  ADL's:  Intact  Cognition: WNL  Sleep:  Number of Hours: 6.25  Assessment - patient is a 40 year old female, history of CAD, recent cardiac event, who reports history of severe anxiety and also depression. At this time continues to report sadness, anxiety, but denies active suicidal ideations at the present time. No psychotic symptoms. No current cardiac symptoms, no chest pain, appears calm and in no acute distress . She does not feel that Cymbalta is working well for her and may be causing side effects - has been on it for several weeks . We discussed options  and she agrees to REMERON trial, which may also help address insomnia . Treatment Plan  Summary: Daily contact with patient to assess and evaluate symptoms and progress in treatment, Medication management, Plan inpatient treatment  and medications as below  Encourage group , milieu participation to work on coping skills and symptom reduction Discontinue Cymbalta due to possible side effects  Start Remeron 7.5 mgrs QHS for depression, anxiety, and to help with insomnia  Continue Ativan 1 mgr Q 8 hours PRN for severe anxiety as needed  On Metoprolol due to cardiac history Will also discontinue Famotidine , as it is new medication for patient, and she states she uses Maalox at home with good response and no side effects Have discussed case with Hospitalist consultant via phone to discuss potential medication adjustments - at this time recommendation is for patient to continue to current dose of Metoprololol. ASA will be added by Hospitalist if considered indicated .  Neita Garnet, MD 11/16/2015, 1:46 PM

## 2015-11-16 NOTE — Plan of Care (Signed)
Problem: Alteration in mood; excessive anxiety as evidenced by: Goal: LTG-Patient's behavior demonstrates decreased anxiety (Patient's behavior demonstrates anxiety and he/she is utilizing learned coping skills to deal with anxiety-producing situations)  Outcome: Not Progressing Patient is anxious and preoccupied with her thoughts of feeling in a bubble and talking about her concerns since having suffered a heart attack.

## 2015-11-16 NOTE — [facility] Group Notes (Addendum)
[facility] LCSW Group Therapy 11/16/2015  1:15 pm  Type of Therapy: Group Therapy Participation Level: Active  Participation Quality: Attentive, Sharing and Supportive  Affect: Anxious  Cognitive: Alert and Oriented  Insight: Developing/Improving and Engaged  Engagement in Therapy: Developing/Improving and Engaged  Modes of Intervention: Clarification, Confrontation, Discussion, Education, Exploration,  Limit-setting, Orientation, Problem-solving, Rapport Building, Dance movement psychotherapist, Socialization and Support  Summary of Progress/Problems: Pt identified obstacles faced currently and processed barriers involved in overcoming these obstacles. Pt identified steps necessary for overcoming these obstacles and explored motivation (internal and external) for facing these difficulties head on. Pt further identified one area of concern in their lives and chose a goal to focus on for today. Patient identified her medical issues as well as her anxiety as obstacles. She also complains of sleep issues. CSW and other group members provided patient with emotional support and encouragement. She left group early and did not return.  Samuella Bruin, LCSW Clinical Social Worker [facility] [phone]

## 2015-11-16 NOTE — Progress Notes (Signed)
NUTRITION ASSESSMENT  Pt identified as at risk on the Malnutrition Screen Tool  INTERVENTION: 1. Supplements: Boost Breeze po PRN, each supplement provides 250 kcal and 9 grams of protein  NUTRITION DIAGNOSIS: Unintentional weight loss related to sub-optimal intake as evidenced by pt report.   Goal: Pt to meet >/= 90% of their estimated nutrition needs.  Monitor:  PO intake  Assessment:  Pt admitted with depression and anxiety. Pt recently had a heart attack per chart. Per weight history, pt has lost 10 lb in the last 2 weeks, 6% wt loss x 2 weeks is significant for time frame. RD to order [facility] for patient if not consuming >50% of meals.  Height: Ht Readings from Last 1 Encounters:  11/15/15 5\' 2"  (1.575 m)    Weight: Wt Readings from Last 1 Encounters:  11/15/15 170 lb (77.111 kg)    Weight Hx: Wt Readings from Last 10 Encounters:  11/15/15 170 lb (77.111 kg)  11/13/15 170 lb (77.111 kg)  11/12/15 170 lb (77.111 kg)  11/11/15 170 lb (77.111 kg)  11/06/15 178 lb 9.6 oz (81.012 kg)  11/01/15 180 lb (81.647 kg)  11/22/14 154 lb 2 oz (69.911 kg)  07/03/14 147 lb (66.679 kg)  07/01/14 150 lb (68.04 kg)    BMI:  Body mass index is 31.09 kg/(m^2). Pt meets criteria for obesity based on current BMI.  Estimated Nutritional Needs: Kcal: 25-30 kcal/kg Protein: > 1 gram protein/kg Fluid: 1 ml/kcal  Diet Order: Diet heart healthy/carb modified Room service appropriate?: Yes; Fluid consistency:: Thin Pt is also offered choice of unit snacks mid-morning and mid-afternoon.  Pt is eating as desired.   Lab results and medications reviewed.   Tilda Franco, MS, RD, LDN Pager: [phone] After Hours Pager: [phone]

## 2015-11-16 NOTE — Progress Notes (Deleted)
Recreation Therapy Notes  Date: 04.24.2017 Time: 9:30am Location: [address] Group Room   Group Topic: Stress Management  Goal Area(s) Addresses:  Patient will actively participate in stress management techniques presented during session.   Behavioral Response: Did not attend.   Blade Scheff L Jenavive Lamboy, LRT/CTRS        Demarrio Menges L 11/16/2015 3:44 PM 

## 2015-11-16 NOTE — Consult Note (Signed)
Received a call from Dr. Jama Flavors, psychiatry.    This is a 40 year old female with recent hospitalization for NSTEMI. Patient hand echocardiogram which normal left ventricular function without any wall motion abnormalities. Patient then had a coronary angiogram on 11/06/2015 which showed normal coronaries. It was thought that her NSTEMI was due to coronary spasm. She was encouraged to stop smoking.  The question was should patient be on aspirin. I did discuss this with Dr. Herbie Baltimore, cardiologist. Since patient's echocardiogram as well as angiogram were negative, there is no indication for aspirin therapy at this time.  Time spent: 20 minutes  Celine Dishman D.O. [facility] Pager [phone]  If 7PM-7AM, please contact night-coverage [email or web address] Password [facility] 11/16/2015, 2:37 PM

## 2015-11-16 NOTE — [facility] Group Notes (Signed)
[facility] LCSW Aftercare Discharge Planning Group Note  11/16/2015  8:45 AM  Participation Quality: Did Not Attend. Patient invited to participate but declined.  Neha Waight, MSW, LCSW Clinical Social Worker [facility] [phone]   

## 2015-11-16 NOTE — Progress Notes (Signed)
D: Patient continues to have severe anxiety.  She presents with wide-eyed stare and asks, "am I ever going to get better?"  Patient is focused on her blood pressure, which was elevated this morning.  After her BP medication, her diastolic did come down.  She is tearful and attempts to isolate to her room.  Encouraged her to attend stress reduction group, which she did after some prompting.  She continues to complain of anterior chest pain which tylenol was given.  Discussed patient with MD and NP re her medical issues and concerns.  She rated her depression and hopelessness as a 5; anxiety as an 8.  She stated that she slept poorly, however, it was recorded that she slept 6.5.  Her goal is to "get better."  Patient is complaining of some heavy menstrual cramping and bleeding; notified NP of same. A: Continue to monitor medication management and MD orders.  Safety checks completed every 15 minutes per protocol.  Offer support and encouragement as needed. R: Patient is receptive to staff; her behavior is appropriate.

## 2015-11-16 NOTE — Progress Notes (Signed)
Adult Psychoeducational Group Note  Date:  11/16/2015 Time:  9:49 PM  Group Topic/Focus:  Wrap-Up Group:   The focus of this group is to help patients review their daily goal of treatment and discuss progress on daily workbooks.  Participation Level:  Active  Participation Quality:  Appropriate and Attentive  Affect:  Appropriate  Cognitive:  Appropriate  Insight: Appropriate and Good  Engagement in Group:  Engaged  Modes of Intervention:  Discussion  Additional Comments:  Pr goal is to get some rest    Merlinda Frederick 11/16/2015, 9:49 PM

## 2015-11-17 MED ORDER — MAGNESIUM CITRATE PO SOLN
1.0000 | Freq: Once | ORAL | Status: AC
  Administered 2015-11-17: 1 via ORAL

## 2015-11-17 MED ORDER — CLONAZEPAM 0.5 MG PO TABS
0.5000 mg | ORAL_TABLET | Freq: Two times a day (BID) | ORAL | Status: DC
  Administered 2015-11-17 – 2015-11-18 (×3): 0.5 mg via ORAL
  Filled 2015-11-17 (×3): qty 1

## 2015-11-17 MED ORDER — MIRTAZAPINE 15 MG PO TABS
15.0000 mg | ORAL_TABLET | Freq: Every day | ORAL | Status: DC
Start: 2015-11-17 — End: 2015-11-18
  Administered 2015-11-17: 15 mg via ORAL
  Filled 2015-11-17 (×2): qty 1

## 2015-11-17 NOTE — Progress Notes (Signed)
D-  Patient has been actively participating in groups this shift.   Patient denies SI, HI and AVH this shift.  Patient has been complaining of not being able to have a bowel movement.  Patient was prescribed magnesium citrate and was able to have a bowel movement this shift.   A- Assess patient for safety, offer medications as prescribed, engage patient in 1:1 staff talks.   R-  Patient able to contract for safety.

## 2015-11-17 NOTE — Progress Notes (Signed)
D: Pt approached Probation officer at [employer] station initially.  Pt reports "I had a bad day, I woke up and had a blood pressure issue, now I'm off all my meds."  Pt reports her goal is "to sleep."  Pt has anxious affect and mood.  Pt denies SI/HI, denies hallucinations, denies pain.  Pt has been visible in milieu and she is intrusive with staff and peers at times. Pt attended evening group.   A: Introduced self to pt.  Met with pt and offered support and encouragement.  Actively listened to pt.  Medications administered per order.  PRN medication administered for indigestion.  Medication education provided.  R: Pt is compliant with medications.  Pt verbally contracts for safety.  Will continue to monitor and assess.

## 2015-11-17 NOTE — Progress Notes (Signed)
Melissa Butler rates anxiety 7/10 Depression 6/10 abdominal pain 7/10. She is still endorsing heavy bleeding and diarrhea (she was given mag citrate earlier this afternoon). Her goal today was to "just make it through the day". She constantly talks about her recent heart attack and how she has "so much stuff going on in my life". Denies SI/HI/AVH. Encouragement and support given. Medications administered as prescribed. Continue Q 15 minute checks for patient safety and medication effectiveness.

## 2015-11-17 NOTE — Progress Notes (Signed)
Patient ID: Melissa Butler, female   DOB: 1976-01-01, 40 y.o.   MRN: [id number] [facility] MD Progress Note  11/17/2015 12:45 PM Melissa Butler  MRN:  [id number] Subjective:  Patient states she is feeling better today. She reports decreased severity of anxiety. At this time she is denying suicidal ideations. Tolerating medications well , does not endorse medication side effects.  Objective : I have discussed case with treatment team and have met with patient . Patient partially but significantly improved compared to initial presentation. Today feeling better, and presents less severely anxious, less ruminative, with a fuller range of affect,and more future oriented, starting to focus on when she will be discharged and disposition planning . No disruptive or agitated behaviors on unit . Also seems less somatically focused today. She is going to groups-  no agitated or disruptive behaviors. Thus far tolerating Remeron trial well - slept better last night . Marland Kitchen  Principal Problem: MDD (major depressive disorder), recurrent severe, without psychosis ([place]) Diagnosis:   Patient Active Problem List   Diagnosis Date Noted  . MDD (major depressive disorder), recurrent severe, without psychosis ([place]) [F33.2] 11/15/2015  . NSTEMI (non-ST elevated myocardial infarction) ([place]) [I21.4] 11/06/2015  . Anxiety [F41.9] 11/06/2015  . Normal coronary arteries 11/06/15 [Z03.89] 11/06/2015  . ACS (acute coronary syndrome) ([place]) [I24.9] 11/05/2015  . Chest pain [R07.9] 11/05/2015   Total Time spent with patient: 20 minutes     Past Medical History:  Past Medical History  Diagnosis Date  . Diabetes mellitus without complication ([place])   . Hypertension   . Anxiety   . MI (myocardial infarction) [facility])     Past Surgical History  Procedure Laterality Date  . Cesarean section    . Cardiac catheterization N/A 11/06/2015    Procedure: Left Heart Cath and Coronary Angiography;  Surgeon: Sanda Klein, MD;  Location: [place] [facility];  Service: Cardiovascular;  Laterality: N/A;   Family History: History reviewed. No pertinent family history.  Social History:  History  Alcohol Use No     History  Drug Use No    Social History   Social History  . Marital Status: Single    Spouse Name: N/A  . Number of Children: N/A  . Years of Education: N/A   Social History Main Topics  . Smoking status: Current Some Day Smoker -- 0.50 packs/day    Types: Cigarettes  . Smokeless tobacco: None  . Alcohol Use: No  . Drug Use: No  . Sexual Activity: Yes    Birth Control/ Protection: Implant   Other Topics Concern  . None   Social History Narrative   Additional Social History:   Sleep: improved  Appetite:  Good  Current Medications: Current Facility-Administered Medications  Medication Dose Route Frequency Provider Last Rate Last Dose  . acetaminophen (TYLENOL) tablet 650 mg  650 mg Oral Q6H PRN Lurena Nida, NP   650 mg at 11/17/15 0735  . alum & mag hydroxide-simeth (MAALOX/MYLANTA) 200-200-20 MG/5ML suspension 30 mL  30 mL Oral Q6H PRN Jenne Campus, MD   30 mL at 11/17/15 0733  . clonazePAM (KLONOPIN) tablet 0.5 mg  0.5 mg Oral BID Jenne Campus, MD   0.5 mg at 11/17/15 1117  . feeding supplement (BOOST / RESOURCE BREEZE) liquid 1 Container  1 Container Oral PRN Myer Peer Janssen Zee, MD      . metoprolol tartrate (LOPRESSOR) tablet 25 mg  25 mg Oral BID Lurena Nida, NP   25 mg  at 11/17/15 0730  . mirtazapine (REMERON) tablet 15 mg  15 mg Oral QHS Jenne Campus, MD        Lab Results:  No results found for this or any previous visit (from the past 48 hour(s)).  Blood Alcohol level:  Lab Results  Component Value Date   ETH <5 11/11/2015    Physical Findings: AIMS: Facial and Oral Movements Muscles of Facial Expression: None, normal Lips and Perioral Area: None, normal Jaw: None, normal Tongue: None, normal,Extremity Movements Upper (arms, wrists, hands, fingers): None,  normal Lower (legs, knees, ankles, toes): None, normal, Trunk Movements Neck, shoulders, hips: None, normal, Overall Severity Severity of abnormal movements (highest score from questions above): None, normal Incapacitation due to abnormal movements: None, normal Patient's awareness of abnormal movements (rate only patient's report): No Awareness, Dental Status Current problems with teeth and/or dentures?: Yes (rt. upper and lower tooth infected, pt currently on antibiotic for infection) Does patient usually wear dentures?: No  CIWA:    COWS:     Musculoskeletal: Strength & Muscle Tone: within normal limits Gait & Station: normal Patient leans: N/A  Psychiatric Specialty Exam: ROS denies headache , at this time denies chest pain, no shortness of breath.   Blood pressure 120/78, pulse 97, temperature 98.4 F (36.9 C), temperature source Oral, resp. rate 16, height '5\' 2"'  (1.575 m), weight 170 lb (77.111 kg), last menstrual period 11/01/2015.Body mass index is 31.09 kg/(m^2).  General Appearance: Well Groomed  Engineer, water::  Good  Speech:  Normal Rate  Volume:  Normal  Mood:  Improving , less anxious , reports feeling better today   Affect:  Less anxious, more reactive, smiles at times appropriately   Thought Process:  Linear  Orientation:  Other:  fully alert and attentive   Thought Content:  denies hallucinations, no delusions  Suicidal Thoughts:  No- today denies any suicidal ideations, and at this time denies any self injurious ideations, contracts for safety on the unit   Homicidal Thoughts:  No  Memory:  recent and remote grossly intact   Judgement:  Improving   Insight:  Improving   Psychomotor Activity:  Normal  Concentration:  Good  Recall:  Good  Fund of Knowledge:Good  Language: Good  Akathisia:  Negative  Handed:  Right  AIMS (if indicated):     Assets:  Communication Skills Desire for Improvement Resilience  ADL's:  Intact  Cognition: WNL  Sleep:  Number of  Hours: 6.75  Assessment - patient presenting with partial but noticeable improvement- less severely anxious, fuller range of affect, less ruminative, less somatic concerns, and more future oriented. She is tolerating REMERON trial well thus far. Patient states she has been on Klonopin in the past, and feels this medication has been effective and well tolerated in decreasing her anxiety. She denies history of BZD misuse or abuse , and is aware of its addictive and sedating potential . Treatment Plan Summary: Daily contact with patient to assess and evaluate symptoms and progress in treatment, Medication management, Plan inpatient treatment  and medications as below  Encourage group , milieu participation to work on coping skills and symptom reduction Increase  Remeron to 15 mgrs  QHS for depression, anxiety, and to help with insomnia  D/C Ativan PRNs  Start Klonopin 0.5 mgrs BID for anxiety  On Metoprolol due to cardiac history Treatment team working on disposition planning Neita Garnet, MD 11/17/2015, 12:45 PM

## 2015-11-17 NOTE — Plan of Care (Signed)
Problem: Alteration in mood Goal: LTG-Patient reports reduction in suicidal thoughts (Patient reports reduction in suicidal thoughts and is able to verbalize a safety plan for whenever patient is feeling suicidal)  Outcome: Progressing Pt denies SI and verbally contracts for safety.       

## 2015-11-17 NOTE — Tx Team (Signed)
Interdisciplinary Treatment Plan Update (Adult) Date: 11/17/2015   Time Reviewed: 9:30 AM  Progress in Treatment: Attending groups: Infrequently Participating in groups: Yes when she attends Taking medication as prescribed: Yes Tolerating medication: Yes Family/Significant other contact made: No, patient declined collateral contact Patient understands diagnosis: Yes Discussing patient identified problems/goals with staff: Yes Medical problems stabilized or resolved: Yes Denies suicidal/homicidal ideation: Yes Issues/concerns per patient self-inventory: Yes Other:  New problem(s) identified: N/A  Discharge Plan or Barriers: Home with outpatient services    Reason for Continuation of Hospitalization:  Depression Anxiety Medication Stabilization   Comments: N/A  Estimated length of stay: 1-2 days   Patient is a 40 year old female with a diagnosis of Major Depressive Disorder . Pt presented to the hospital with feelings of worthlessness and depression. Pt reports primary trigger(s) for admission was depression. Patient will benefit from crisis stabilization, medication evaluation, group therapy and psycho education in addition to case management for discharge planning. At discharge, it is recommended that Pt remain compliant with established discharge plan and continued treatment.    Review of initial/current patient goals per problem list:  1. Goal(s): Patient will participate in aftercare plan   Met: Yes   Target date: 3-5 days post admission date   As evidenced by: Patient will participate within aftercare plan AEB aftercare provider and housing plan at discharge being identified.  4/25: Goal met. Patient plans to return home to follow up with outpatient services.     2. Goal (s): Patient will exhibit decreased depressive symptoms and suicidal ideations.   Met: No   Target date: 3-5 days post admission date   As evidenced by: Patient will utilize self rating  of depression at 3 or below and demonstrate decreased signs of depression or be deemed stable for discharge by MD.   4/24: Patient rates depression at 5, denies SI.    3. Goal(s): Patient will demonstrate decreased signs and symptoms of anxiety.   Met: No   Target date: 3-5 days post admission date   As evidenced by: Patient will utilize self rating of anxiety at 3 or below and demonstrated decreased signs of anxiety, or be deemed stable for discharge by MD  4/24: Patient rates anxiety at 8.       Attendees: Patient:    Family:    Physician: Dr. Parke Poisson; Dr. Sabra Heck 11/17/2015 9:30 AM  Nursing: Darrol Angel, Elesa Massed, RN 11/17/2015 9:30 AM  Clinical Social Worker: Tilden Fossa, LCSW 11/17/2015 9:30 AM  Other: Maxie Better, LCSW  11/17/2015 9:30 AM  Other:  11/17/2015 9:30 AM  Other: Lars Pinks, Case Manager 11/17/2015 9:30 AM  Other: Larose Kells, NP 11/17/2015 9:30 AM  Other:               Scribe for Treatment Team:  Tilden Fossa, [place]

## 2015-11-17 NOTE — Progress Notes (Signed)
Pt did not attend wrap up group meeting this evening.  

## 2015-11-18 MED ORDER — MIRTAZAPINE 30 MG PO TABS
30.0000 mg | ORAL_TABLET | Freq: Every day | ORAL | Status: DC
Start: 2015-11-18 — End: 2015-11-19
  Administered 2015-11-18: 30 mg via ORAL
  Filled 2015-11-18 (×2): qty 1
  Filled 2015-11-18: qty 7

## 2015-11-18 MED ORDER — GABAPENTIN 100 MG PO CAPS
100.0000 mg | ORAL_CAPSULE | Freq: Three times a day (TID) | ORAL | Status: DC
  Administered 2015-11-18 – 2015-11-19 (×3): 100 mg via ORAL
  Filled 2015-11-18 (×3): qty 1
  Filled 2015-11-18 (×3): qty 21
  Filled 2015-11-18 (×3): qty 1

## 2015-11-18 MED ORDER — NICOTINE 21 MG/24HR TD PT24
21.0000 mg | MEDICATED_PATCH | Freq: Every day | TRANSDERMAL | Status: DC
  Administered 2015-11-18 – 2015-11-19 (×2): 21 mg via TRANSDERMAL
  Filled 2015-11-18 (×5): qty 1

## 2015-11-18 MED ORDER — CLONAZEPAM 0.5 MG PO TABS
0.5000 mg | ORAL_TABLET | Freq: Three times a day (TID) | ORAL | Status: DC
  Administered 2015-11-18 – 2015-11-19 (×3): 0.5 mg via ORAL
  Filled 2015-11-18 (×3): qty 1

## 2015-11-18 NOTE — Progress Notes (Addendum)
Patient ID: Melissa Butler, female   DOB: Feb 19, 1976, 40 y.o.   MRN: [id number] [facility] MD Progress Note  11/18/2015 5:33 PM Melissa Butler  MRN:  [id number] Subjective:  Patient reports overall improvement compared to admission , but continues to report significant anxiety. Today more verbal and better able to describe anxiety symptoms and etiology. States she has lost several loved ones, to include unexpected death of her child's father. States that her daughter is now pregnant, and describes , although realizing that thought " is not rational " a fear that " I am just waiting for another loved one to die", and worrying about her daughter 's and unborn grandchild's health. She denies medication side effects. Had time limited diarrhea following dose of Mag Citrate for constipation , states this now improved .   Objective : I have discussed case with treatment team and have met with patient . Patient reporting overall improved mood but persistence of significant anxiety, as above. Better able to verbalize it as above. Responds well to support, encouragement , and review of strategies to mitigate anxiety. Her affect does improve partially during session. She states Klonopin is helping, but still has anxiety in spite of it. We discussed Neurontin as an option for anxiety and because she does describe some chronic , vague, lower extremity pain. Side effects reviewed .Marland Kitchen No disruptive or agitated behaviors on unit . Going to groups,interacting appropriately with peers , staff, at times presenting needy, seeking staff reassurance and support .  Principal Problem: MDD (major depressive disorder), recurrent severe, without psychosis ([place]) Diagnosis:   Patient Active Problem List   Diagnosis Date Noted  . MDD (major depressive disorder), recurrent severe, without psychosis ([place]) [F33.2] 11/15/2015  . NSTEMI (non-ST elevated myocardial infarction) ([place]) [I21.4] 11/06/2015  . Anxiety [F41.9] 11/06/2015  . Normal  coronary arteries 11/06/15 [Z03.89] 11/06/2015  . ACS (acute coronary syndrome) ([place]) [I24.9] 11/05/2015  . Chest pain [R07.9] 11/05/2015   Total Time spent with patient: 25 minutes     Past Medical History:  Past Medical History  Diagnosis Date  . Diabetes mellitus without complication ([place])   . Hypertension   . Anxiety   . MI (myocardial infarction) [facility])     Past Surgical History  Procedure Laterality Date  . Cesarean section    . Cardiac catheterization N/A 11/06/2015    Procedure: Left Heart Cath and Coronary Angiography;  Surgeon: Sanda Klein, MD;  Location: [place] [facility];  Service: Cardiovascular;  Laterality: N/A;   Family History: History reviewed. No pertinent family history.  Social History:  History  Alcohol Use No     History  Drug Use No    Social History   Social History  . Marital Status: Single    Spouse Name: N/A  . Number of Children: N/A  . Years of Education: N/A   Social History Main Topics  . Smoking status: Current Some Day Smoker -- 0.50 packs/day    Types: Cigarettes  . Smokeless tobacco: None  . Alcohol Use: No  . Drug Use: No  . Sexual Activity: Yes    Birth Control/ Protection: Implant   Other Topics Concern  . None   Social History Narrative   Additional Social History:   Sleep: improved  Appetite:  Good  Current Medications: Current Facility-Administered Medications  Medication Dose Route Frequency Provider Last Rate Last Dose  . acetaminophen (TYLENOL) tablet 650 mg  650 mg Oral Q6H PRN Lurena Nida, NP   650 mg at  11/18/15 0728  . alum & mag hydroxide-simeth (MAALOX/MYLANTA) 200-200-20 MG/5ML suspension 30 mL  30 mL Oral Q6H PRN Jenne Campus, MD   30 mL at 11/17/15 0733  . clonazePAM (KLONOPIN) tablet 0.5 mg  0.5 mg Oral TID Jenne Campus, MD   0.5 mg at 11/18/15 1716  . feeding supplement (BOOST / RESOURCE BREEZE) liquid 1 Container  1 Container Oral PRN Myer Peer Bon [place], MD      . metoprolol tartrate  (LOPRESSOR) tablet 25 mg  25 mg Oral BID Lurena Nida, NP   25 mg at 11/18/15 1716  . mirtazapine (REMERON) tablet 30 mg  30 mg Oral QHS Hyland Mollenkopf A Zeynab Klett, MD      . nicotine (NICODERM CQ - dosed in mg/24 hours) patch 21 mg  21 mg Transdermal Daily Jenne Campus, MD   21 mg at 11/18/15 1713    Lab Results:  No results found for this or any previous visit (from the past 48 hour(s)).  Blood Alcohol level:  Lab Results  Component Value Date   ETH <5 11/11/2015    Physical Findings: AIMS: Facial and Oral Movements Muscles of Facial Expression: None, normal Lips and Perioral Area: None, normal Jaw: None, normal Tongue: None, normal,Extremity Movements Upper (arms, wrists, hands, fingers): None, normal Lower (legs, knees, ankles, toes): None, normal, Trunk Movements Neck, shoulders, hips: None, normal, Overall Severity Severity of abnormal movements (highest score from questions above): None, normal Incapacitation due to abnormal movements: None, normal Patient's awareness of abnormal movements (rate only patient's report): No Awareness, Dental Status Current problems with teeth and/or dentures?: Yes (rt. upper and lower tooth infected, pt currently on antibiotic for infection) Does patient usually wear dentures?: No  CIWA:    COWS:     Musculoskeletal: Strength & Muscle Tone: within normal limits Gait & Station: normal Patient leans: N/A  Psychiatric Specialty Exam: ROS denies headache , at this time denies chest pain, no shortness of breath. Describes fleeing chest discomfort , particularly when feeling more anxious- at this time states she is less concerned because she is more confident " it is because of anxiety, not my heart" Reports heavy menses, had been on Depo contraceptive but has not taken for several weeks to months.     Blood pressure 136/91, pulse 62, temperature 98.6 F (37 C), temperature source Oral, resp. rate 16, height 5' 2" (1.575 m), weight 170 lb (77.111  kg), last menstrual period 11/01/2015.Body mass index is 31.09 kg/(m^2).  General Appearance: Well Groomed  Engineer, water::  Good  Speech:  Normal Rate  Volume:  Normal  Mood:  Mood improved, less depressed  Affect:   reactive, smiles at times appropriately , remains anxious   Thought Process:  Linear  Orientation:  Other:  fully alert and attentive   Thought Content:  denies hallucinations, no delusions  Suicidal Thoughts:  No- today denies any suicidal ideations, and at this time denies any self injurious ideations, contracts for safety on the unit   Homicidal Thoughts:  No  Memory:  recent and remote grossly intact   Judgement:  Improving   Insight:  Improving   Psychomotor Activity:  Normal  Concentration:  Good  Recall:  Good  Fund of Knowledge:Good  Language: Good  Akathisia:  Negative  Handed:  Right  AIMS (if indicated):     Assets:  Communication Skills Desire for Improvement Resilience  ADL's:  Intact  Cognition: WNL  Sleep:  Number of Hours: 6.5  Assessment -  patient presents with improved mood and range of affect, but continues to struggle with anxiety, although better able to discuss etiology- see above, and more responsive to support , reassurance. Denies medication side effects, but feels current medication regimen only partially addressing anxiety  Treatment Plan Summary: Daily contact with patient to assess and evaluate symptoms and progress in treatment, Medication management, Plan inpatient treatment  and medications as below  Encourage group , milieu participation to work on coping skills and symptom reduction Increase   Remeron to 30  mgrs  QHS for depression, ongoing  anxiety, and to help with insomnia  Increase Klonopin 0.5 mgrs TID  for anxiety - side effects, abuse potential has been reviewed  Start Neurontin 100 mgrs TID for anxiety, pain issues  On Metoprolol due to cardiac history- plans to follow up with outpatient cardiology for ongoing monitoring and  management as needed . Also plans to follow up with PCP or Ob/Gyn for management of heavy menses as needed  Treatment team working on disposition planning Neita Garnet, MD 11/18/2015, 5:33 PM

## 2015-11-18 NOTE — Progress Notes (Signed)
D- Patient has been actively participating in groups this shift. Patient denies SI, HI and AVH this shift. Patient has been complaining of anxiety and a racing pulse but her vital signs have been WNL.   A- Assess patient for safety, offer medications as prescribed, engage patient in 1:1 staff talks.   R- Patient able to contract for safety.

## 2015-11-18 NOTE — Progress Notes (Signed)
Recreation Therapy Notes  Date: 04.26.2017 Time: 9:30am Location: [address] Group Room   Group Topic: Stress Management  Goal Area(s) Addresses:  Patient will actively participate in stress management techniques presented during session.   Behavioral Response: Engaged, Attentive  Intervention: Stress management techniques  Activity : Deep Breathing and Guided Imagery. LRT provided education, instruction and demonstration on practice of Deep Breathing and Guided Imagery. Patient was asked to participate in technique introduced during session.   Education:  Stress Management, Discharge Planning.   Education Outcome: Acknowledges education  Clinical Observations/Feedback: Patient actively engaged in technique introduced, expressed no concerns and demonstrated ability to practice independently post d/c.   Darrien Laakso L Luba Matzen, LRT/CTRS        Matty Vanroekel L 11/18/2015 2:27 PM 

## 2015-11-19 MED ORDER — GABAPENTIN 100 MG PO CAPS: 100.0000 mg | ORAL_CAPSULE | Freq: Three times a day (TID) | ORAL | Status: DC

## 2015-11-19 MED ORDER — ACETAMINOPHEN 325 MG PO TABS: 650.0000 mg | ORAL_TABLET | ORAL | Status: DC | PRN

## 2015-11-19 MED ORDER — ALBUTEROL SULFATE HFA 108 (90 BASE) MCG/ACT IN AERS: 2.0000 | INHALATION_SPRAY | Freq: Four times a day (QID) | RESPIRATORY_TRACT | Status: DC | PRN

## 2015-11-19 MED ORDER — CLONAZEPAM 0.5 MG PO TABS: 0.5000 mg | ORAL_TABLET | Freq: Three times a day (TID) | ORAL | Status: DC

## 2015-11-19 MED ORDER — METOPROLOL TARTRATE 25 MG PO TABS: 25.0000 mg | ORAL_TABLET | Freq: Two times a day (BID) | ORAL | Status: DC

## 2015-11-19 MED ORDER — NICOTINE 21 MG/24HR TD PT24: 21.0000 mg | MEDICATED_PATCH | Freq: Every day | TRANSDERMAL | Status: DC

## 2015-11-19 MED ORDER — MIRTAZAPINE 30 MG PO TABS: 30.0000 mg | ORAL_TABLET | Freq: Every day | ORAL | Status: DC

## 2015-11-19 NOTE — [facility] Group Notes (Signed)
Late Entry From 11/18/15 at 1:15pm:         [facility] LCSW Group Therapy Type of Therapy: Group Therapy Participation Level: Active  Participation Quality: Attentive, Sharing and Supportive  Affect: Blunted and tearful  Cognitive: Alert and Oriented  Insight: Developing/Improving and Engaged  Engagement in Therapy: Developing/Improving and Engaged  Modes of Intervention: Clarification, Confrontation, Discussion, Education, Exploration, Limit-setting, Orientation, Problem-solving, Rapport Building, Dance movement psychotherapist, Socialization and Support  Summary of Progress/Problems: The topic for group was balance in life. Today's group focused on defining balance in one's own words, identifying things that can knock one off balance, and exploring healthy ways to maintain balance in life. Group members were asked to provide an example of a time when they felt off balance, describe how they handled that situation,and process healthier ways to regain balance in the future. Group members were asked to share the most important tool for maintaining balance that they learned while at [facility] and how they plan to apply this method after discharge. Patient shared that she has been holding on to emotional pain from her past, as well as anxiety related to medical issues which are causing her life to be out of balance. Patient reports, "I've go to let it go." Patient was tearful while sharing. She left group early and did not return.   Samuella Bruin, MSW, LCSW Clinical Social Worker [facility] [phone]

## 2015-11-19 NOTE — Discharge Summary (Signed)
Physician Discharge Summary Note  Patient:  Melissa Butler is an 40 y.o., female MRN:  [id number] DOB:  [birth year] Patient phone:  [phone] (home)  Patient address:   [address] [place] [address],  Total Time spent with patient: Greater than 30 minutes  Date of Admission:  11/15/2015 Date of Discharge: 11-19-15  Reason for Admission:  Worsening symptoms of depression  Principal Problem: MDD (major depressive disorder), recurrent severe, without psychosis [facility])  Discharge Diagnoses: Patient Active Problem List   Diagnosis Date Noted  . MDD (major depressive disorder), recurrent severe, without psychosis (HCC) [F33.2] 11/15/2015  . NSTEMI (non-ST elevated myocardial infarction) (HCC) [I21.4] 11/06/2015  . Anxiety [F41.9] 11/06/2015  . Normal coronary arteries 11/06/15 [Z03.89] 11/06/2015  . ACS (acute coronary syndrome) (HCC) [I24.9] 11/05/2015  . Chest pain [R07.9] 11/05/2015   Past Psychiatric History: Major depression  Past Medical History:  Past Medical History  Diagnosis Date  . Diabetes mellitus without complication (HCC)   . Hypertension   . Anxiety   . MI (myocardial infarction) [facility])     Past Surgical History  Procedure Laterality Date  . Cesarean section    . Cardiac catheterization N/A 11/06/2015    Procedure: Left Heart Cath and Coronary Angiography;  Surgeon: Thurmon Fair, MD;  Location: [facility] INVASIVE CV LAB;  Service: Cardiovascular;  Laterality: N/A;   Family History: History reviewed. No pertinent family history.  Family Psychiatric  History: See H&P  Social History:  History  Alcohol Use No     History  Drug Use No    Social History   Social History  . Marital Status: Single    Spouse Name: N/A  . Number of Children: N/A  . Years of Education: N/A   Social History Main Topics  . Smoking status: Current Some Day Smoker -- 0.50 packs/day    Types: Cigarettes  . Smokeless tobacco: None  . Alcohol Use: No  . Drug Use: No  . Sexual  Activity: Yes    Birth Control/ Protection: Implant   Other Topics Concern  . None   Social History Narrative   Hospital Course:  Melissa Butler 40 yr old white female admitted to [facility] after brief hospitalization of MI. Patient states that for past several weeks her anxiety was worsening; states that she worries about everything. "I worry about my kids; if they are going to be okay; I worry about losing people; I worry that I'm not good enough. I always feel closed in like I'm in a bubble." Patient states that she lives with her mother and her 30 yr old son. States that she in unemployed related to not having transportation to get to a job. At this time denies suicidal/homicidal ideation; psychosis, and paranoia.   Melissa Butler was admitted to the hospital for symptoms of depression manifested as excessive worrying & high anxiety levels. Although, denying SIHI on admission, Melissa Butler was still in need of mood stabilization treatments. After evaluation of her symptoms, the medication regimen for her presenting symptoms were discussed & with her consent, initiated.  She was medicated & discharged on; Mirtazapine 30 mg for depression, Gabapentin 100 mg for agitation & Clonazepam 0.5 mg for anxiety. Melissa Butler's other pre-existing medical problems were identified & treated by resuming her pertinent home medications for those health issues. She was also enrolled in the group counseling sessions being offered & held on this unit. She learned coping skills.  As her treatment progressed, [name]'s improvement was monitored by observation & daily report of  symptom reduction noted.  Her emotional & mental status were monitored by daily self-inventory reports completed by her and the clinical staff. She was evaluated by the treatment team for mood stability and plans for continued recovery after discharge. Melissa Butler's motivation was an integral factor in her mood stability. She was offered further treatment options upon discharge to  continue mental health care & medication management on an outpatient basis as noted below.  Upon discharge, Melissa Butler was both mentally & medically stable for discharge denying suicidal/homicidal ideation, auditory, visual, tactile hallucinations, delusional thoughts & or paranoia. She was provided with a 7 days worth, supply samples of her [facility] discharge medications. She left [facility] with all personal belongings in no apparent distress. Transportation per her arrangement.  Physical Findings: AIMS: Facial and Oral Movements Muscles of Facial Expression: None, normal Lips and Perioral Area: None, normal Jaw: None, normal Tongue: None, normal,Extremity Movements Upper (arms, wrists, hands, fingers): None, normal Lower (legs, knees, ankles, toes): None, normal, Trunk Movements Neck, shoulders, hips: None, normal, Overall Severity Severity of abnormal movements (highest score from questions above): None, normal Incapacitation due to abnormal movements: None, normal Patient's awareness of abnormal movements (rate only patient's report): No Awareness, Dental Status Current problems with teeth and/or dentures?: Yes Does patient usually wear dentures?: No  CIWA:  CIWA-Ar Total: 1 COWS:  COWS Total Score: 1  Musculoskeletal: Strength & Muscle Tone: within normal limits Gait & Station: normal Patient leans: N/A  Psychiatric Specialty Exam: Review of Systems  Constitutional: Negative.   HENT: Negative.   Eyes: Negative.   Respiratory: Negative.   Cardiovascular: Negative.   Gastrointestinal: Negative.   Genitourinary: Negative.   Musculoskeletal: Negative.   Skin: Negative.   Endo/Heme/Allergies: Negative.   Psychiatric/Behavioral: Positive for depression (Stable). Negative for suicidal ideas, hallucinations, memory loss and substance abuse. The patient has insomnia (Stable). The patient is not nervous/anxious.     Blood pressure 117/81, pulse 83, temperature 98.3 F (36.8 C), temperature source  Oral, resp. rate 16, height 5\' 2"  (1.575 m), weight 77.111 kg (170 lb), last menstrual period 11/01/2015.Body mass index is 31.09 kg/(m^2).  See Md's SRA   Have you used any form of tobacco in the last 30 days? (Cigarettes, Smokeless Tobacco, Cigars, and/or Pipes): Yes  Has this patient used any form of tobacco in the last 30 days? (Cigarettes, Smokeless Tobacco, Cigars, and/or Pipes): Yes, Provided with nicotine patch prescription.   Blood Alcohol level:  Lab Results  Component Value Date   ETH <5 11/11/2015    Metabolic Disorder Labs:  Lab Results  Component Value Date   HGBA1C 5.6 11/06/2015   MPG 114 11/06/2015   No results found for: PROLACTIN Lab Results  Component Value Date   CHOL 139 11/06/2015   TRIG 181* 11/06/2015   HDL 31* 11/06/2015   CHOLHDL 4.5 11/06/2015   VLDL 36 11/06/2015   LDLCALC 72 11/06/2015   See Psychiatric Specialty Exam and Suicide Risk Assessment completed by Attending Physician prior to discharge.  Discharge destination:  Home  Is patient on multiple antipsychotic therapies at discharge:  No   Has Patient had three or more failed trials of antipsychotic monotherapy by history:  No  Recommended Plan for Multiple Antipsychotic Therapies: NA    Medication List    STOP taking these medications        clindamycin 300 MG capsule  Commonly known as:  CLEOCIN     dicyclomine 20 MG tablet  Commonly known as:  BENTYL  DULoxetine 60 MG capsule  Commonly known as:  CYMBALTA     LORazepam 1 MG tablet  Commonly known as:  ATIVAN     naproxen 500 MG tablet  Commonly known as:  NAPROSYN     ranitidine 150 MG tablet  Commonly known as:  ZANTAC     traMADol 50 MG tablet  Commonly known as:  ULTRAM      TAKE these medications      Indication   acetaminophen 325 MG tablet  Commonly known as:  TYLENOL  Take 2 tablets (650 mg total) by mouth every 4 (four) hours as needed for headache or mild pain.   Indication:  Pain     albuterol  108 (90 Base) MCG/ACT inhaler  Commonly known as:  PROAIR HFA  Inhale 2 puffs into the lungs every 6 (six) hours as needed for wheezing or shortness of breath.   Indication:  Asthma     clonazePAM 0.5 MG tablet  Commonly known as:  KLONOPIN  Take 1 tablet (0.5 mg total) by mouth 3 (three) times daily. For anxiety   Indication:  Anxiety     gabapentin 100 MG capsule  Commonly known as:  NEURONTIN  Take 1 capsule (100 mg total) by mouth 3 (three) times daily. For agitation   Indication:  Agitation     metoprolol tartrate 25 MG tablet  Commonly known as:  LOPRESSOR  Take 1 tablet (25 mg total) by mouth 2 (two) times daily. For high blood pressure   Indication:  High Blood Pressure     mirtazapine 30 MG tablet  Commonly known as:  REMERON  Take 1 tablet (30 mg total) by mouth at bedtime. For depression/sleep   Indication:  Trouble Sleeping, Major Depressive Disorder     nicotine 21 mg/24hr patch  Commonly known as:  NICODERM CQ - dosed in mg/24 hours  Place 1 patch (21 mg total) onto the skin daily. For smokin cessation   Indication:  Nicotine Addiction       Follow-up Information    Follow up with [facility]  On 11/20/2015.   Why:  Hospital follow up appointment for therapy and medication management services on Friday 4/28 between the hours of 8am -10am. Please arrive early to be seen as quickly as possible and bring your ID, social security card, and any proof of income with you   Contact information:   [address] [place], [place] [address] Phone: [phone]     Follow-up recommendations: Activity:  As tolerated Diet: As recommended by your primary care doctor. Keep all scheduled follow-up appointments as recommended.   Comments: Take all your medications as prescribed by your mental healthcare provider. Report any adverse effects and or reactions from your medicines to your outpatient provider promptly. Patient is instructed and cautioned to not engage in alcohol and  or illegal drug use while on prescription medicines. In the event of worsening symptoms, patient is instructed to call the crisis hotline, 911 and or go to the nearest ED for appropriate evaluation and treatment of symptoms. Follow-up with your primary care provider for your other medical issues, concerns and or health care needs.   Signed: Sanjuana Kava, NP, PMHNP, FNP-BC 11/19/2015, 4:35 PM   Patient seen, Suicide Assessment Completed.  Disposition Plan Reviewed

## 2015-11-19 NOTE — [facility] Group Notes (Signed)
Late Entry From 11/17/15 at 1:15pm:  BHH LCSW Group Therapy   Type of Therapy:  Group Therapy  Participation Level:  Minimal  Participation Quality:  Attentive  Affect: Anxious  Cognitive:  Alert and Oriented  Insight:  Developing/Improving and Engaged  Engagement in Therapy:  Developing/Improving and Engaged  Modes of Intervention:  Clarification, Confrontation, Discussion, Education, Exploration, Limit-setting, Orientation, Problem-solving, Rapport Building, Dance movement psychotherapist, Socialization and Support  Summary of Progress/Problems: The topic for group therapy was feelings about diagnosis.  Pt actively participated in group discussion on their past and current diagnosis and how they feel towards this.  Pt also identified how society and family members judge them, based on their diagnosis as well as stereotypes and stigmas.  Patient participated minimally in discussion, left group room frequently.  Samuella Bruin, MSW, LCSW Clinical Social Worker [facility] [phone]

## 2015-11-19 NOTE — Progress Notes (Signed)
Discharge Note:  Patient discharged home with family member.  Patient denied SI and HI.  Denied A/V hallucinations  Suicide prevention information given and discussed with patient who stated she understood and had no questions.  Patient stated she received all her belongings, clothing, misc items, toiletries, cell phone, case, purse, keys cards, billford, etc. All discharge information given to patient at discharge per [facility] requirements.  Patient stated she appreciated all assistance received from [facility] staff.

## 2015-11-19 NOTE — Tx Team (Signed)
Interdisciplinary Treatment Plan Update (Adult) Date: 11/19/2015   Time Reviewed: 9:30 AM  Progress in Treatment: Attending groups: Infrequently Participating in groups: Yes when she attends Taking medication as prescribed: Yes Tolerating medication: Yes Family/Significant other contact made: No, patient declined collateral contact Patient understands diagnosis: Yes Discussing patient identified problems/goals with staff: Yes Medical problems stabilized or resolved: Yes Denies suicidal/homicidal ideation: Yes Issues/concerns per patient self-inventory: Yes Other:  New problem(s) identified: N/A  Discharge Plan or Barriers: Home with outpatient services    Reason for Continuation of Hospitalization:  Depression Anxiety Medication Stabilization   Comments: N/A  Estimated length of stay: Discharge anticipated for today 11/19/15   Patient is a 40 year old female with a diagnosis of Major Depressive Disorder . Pt presented to the hospital with feelings of worthlessness and depression. Pt reports primary trigger(s) for admission was depression. Patient will benefit from crisis stabilization, medication evaluation, group therapy and psycho education in addition to case management for discharge planning. At discharge, it is recommended that Pt remain compliant with established discharge plan and continued treatment.    Review of initial/current patient goals per problem list:  1. Goal(s): Patient will participate in aftercare plan   Met: Yes   Target date: 3-5 days post admission date   As evidenced by: Patient will participate within aftercare plan AEB aftercare provider and housing plan at discharge being identified.  4/25: Goal met. Patient plans to return home to follow up with outpatient services.     2. Goal (s): Patient will exhibit decreased depressive symptoms and suicidal ideations.   Met: Adequate for discharge per MD   Target date: 3-5 days post admission  date   As evidenced by: Patient will utilize self rating of depression at 3 or below and demonstrate decreased signs of depression or be deemed stable for discharge by MD.   4/24: Patient rates depression at 5, denies SI.   4/27: Adequate for discharge per MD. Patient reports improvement in her symptoms, denies SI.    3. Goal(s): Patient will demonstrate decreased signs and symptoms of anxiety.   Met: Adequate for discharge per MD   Target date: 3-5 days post admission date   As evidenced by: Patient will utilize self rating of anxiety at 3 or below and demonstrated decreased signs of anxiety, or be deemed stable for discharge by MD  4/24: Patient rates anxiety at 8.  4/27: Adequate for discharge per MD. Patient reports improvement in symptoms and reports feeling safe for discharge today.      Attendees: Patient:    Family:    Physician: Dr. Parke Poisson; Dr. Sabra Heck 11/19/2015 9:30 AM  Nursing: Loletta Specter, Grayland Ormond, Eulogio Bear , RN 11/19/2015 9:30 AM  Clinical Social Worker: Tilden Fossa, LCSW 11/19/2015 9:30 AM  Other: Maxie Better, LCSW  11/19/2015 9:30 AM  Other:  11/19/2015 9:30 AM  Other: Lars Pinks, Case Manager 11/19/2015 9:30 AM  Other: Agustina Caroli, NP 11/19/2015 9:30 AM  Other:          Scribe for Treatment Team:  Tilden Fossa, [place]

## 2015-11-19 NOTE — Progress Notes (Signed)
D:  Patient's self inventory sheet, patient sleeps good, sleep medication is helpful.  Fair appetite, normal energy level, good concentration.  Rated depression, hopeless and anxiety #4.  Denied withdrawals, but then marked sedation, cramping.  Denied SI.  Physical problems, lightheaded.  Denied physical pain.  Goal is to discharge.  Plans to get help on resources.  Does have discharge plans. A:  Medications administered per MD orders.  Emotional support and encouragement given patient. R:  Denied SI and HI, contracts for safety.  Denied A/V hallucinations.  Safety maintained with 15 minute checks.

## 2015-11-19 NOTE — [facility] Group Notes (Signed)
The focus of this group is to educate the patient on the purpose and policies of crisis stabilization and provide a format to answer questions about their admission.  The group details unit policies and expectations of patients while admitted.  Patient attended 0900 nurse education orientation group this morning.  Patient actively participated and had appropriate affect.  Patient is alert.  Patient had appropriate insight and appropriate engagement.  Today patient will work on 3 goals for discharge.  

## 2015-11-19 NOTE — Progress Notes (Signed)
  [facility] Adult Case Management Discharge Plan :  Will you be returning to the same living situation after discharge:  Yes,  patient plans to return home At discharge, do you have transportation home?: Yes,  patient reports access to transportation Do you have the ability to pay for your medications: Yes,  patient will be provided with prescriptions at discharge  Release of information consent forms completed and in the chart;  Patient's signature needed at discharge.  Patient to Follow up at: Follow-up Information    Follow up with [facility]  On 11/20/2015.   Why:  Hospital follow up appointment for therapy and medication management services on Friday 4/28 between the hours of 8am -10am. Please arrive early to be seen as quickly as possible and bring your ID, social security card, and any proof of income with you   Contact information:   [address] [place], [place] [address] Phone: [phone]      Next level of care provider has access to [facility] Link:no  Safety Planning and Suicide Prevention discussed: Yes,  with patient  Have you used any form of tobacco in the last 30 days? (Cigarettes, Smokeless Tobacco, Cigars, and/or Pipes): Yes  Has patient been referred to the Quitline?: Patient refused referral  Patient has been referred for addiction treatment: Yes  Melissa Butler, [place] 11/19/2015, 11:09 AM

## 2015-11-19 NOTE — BHH Group Notes (Signed)
Late Entry From 11/18/15 at 8:45am:   [facility] LCSW Aftercare Discharge Planning Group Note    Participation Quality: Did Not Attend. Patient invited to participate but declined.  Ford Peddie, MSW, LCSW Clinical Social Worker [facility] [phone]   

## 2015-11-19 NOTE — BHH Suicide Risk Assessment (Addendum)
[facility] Discharge Suicide Risk Assessment   Principal Problem: MDD (major depressive disorder), recurrent severe, without psychosis (HCC) Discharge Diagnoses:  Patient Active Problem List   Diagnosis Date Noted  . MDD (major depressive disorder), recurrent severe, without psychosis (HCC) [F33.2] 11/15/2015  . NSTEMI (non-ST elevated myocardial infarction) (HCC) [I21.4] 11/06/2015  . Anxiety [F41.9] 11/06/2015  . Normal coronary arteries 11/06/15 [Z03.89] 11/06/2015  . ACS (acute coronary syndrome) (HCC) [I24.9] 11/05/2015  . Chest pain [R07.9] 11/05/2015    Total Time spent with patient: 30 minutes  Musculoskeletal: Strength & Muscle Tone: within normal limits Gait & Station: normal Patient leans: N/A  Psychiatric Specialty Exam: ROS  Blood pressure 117/81, pulse 83, temperature 98.3 F (36.8 C), temperature source Oral, resp. rate 16, height  (1.575 m), weight 170 lb (77.111 kg), last menstrual period 11/01/2015.Body mass index is 31.09 kg/(m^2).  General Appearance: Well Groomed  Eye Contact::  Good  Speech:  Normal Rate409  Volume:  Normal  Mood:  improved mood and improved range of affect, today denies feeling depressed   Affect:  Appropriate and Full Range  Thought Process:  Linear  Orientation:  Full (Time, Place, and Person)  Thought Content:  denies hallucinations, no delusions , not internally preoccupied   Suicidal Thoughts:  No- denies any suicidal ideations, denies any self injurious ideations   Homicidal Thoughts:  No- denies any homicidal ideations and denies any violent ideations   Memory:  recent and remote grossly intact   Judgement:  Other:  improved  Insight:  improved   Psychomotor Activity:  Normal  Concentration:  Good  Recall:  Good  Fund of Knowledge:Good  Language: Good  Akathisia:  Negative  Handed:  Right  AIMS (if indicated):     Assets:  Communication Skills Desire for Improvement Physical Health Resilience  Sleep:  Number of Hours: 6.5   Cognition: WNL  ADL's:  Intact   Mental Status Per Nursing Assessment::   On Admission:  NA  Demographic Factors:  40 year old female   Loss Factors: Recent cardiac event, NSTEMI Death of loved one  Historical Factors: No prior psychiatric admissions . History of anxiety and depression.  Risk Reduction Factors:   Sense of responsibility to family and Positive coping skills or problem solving skills  Continued Clinical Symptoms:  At this time patient presents fully alert, attentive, well related, today denies depression and presents euthymic, affect full in range, no thought disorder, no SI or HI, no psychotic symptoms, future oriented, denies medication side effects. We reviewed side effect profiles, aware of addictive, sedating properties of BZD .  On discharge denies any chest pain or discomfort, no shortness of breath at room air  * patient has reported heavy menses, irregular menstrual periods- she has been on a Depot contraceptive but had not returned for renewal. Based on recent cardiac event and as she is a smoker , I have encouraged her to discuss best management  option with her Ob/Gyn  * Patient's labs indicate recent work up for VDs and positive RPR - patient denies any symptoms or risk factors and states she thinks it has to be a false positive, but states she will follow up with her PCP for retesting and management if needed  Cognitive Features That Contribute To Risk:  No gross cognitive deficits noted upon discharge. Is alert , attentive, and oriented x 3   Suicide Risk:  Mild:  Suicidal ideation of limited frequency, intensity, duration, and specificity.  There are no identifiable  plans, no associated intent, mild dysphoria and related symptoms, good self-control (both objective and subjective assessment), few other risk factors, and identifiable protective factors, including available and accessible social support.  Follow-up Information    Follow up with [facility]  On 11/20/2015.   Why:  Hospital follow up appointment for therapy and medication management services on Friday 4/28 between the hours of 8am -10am. Please arrive early to be seen as quickly as possible and bring your ID, social security card, and any proof of income with you   Contact information:   [address] [place], [place] [address] Phone: [phone]      Plan Of Care/Follow-up recommendations:  Activity:  as tolerated  Diet:  heart healthy Tests:  NA Other:  see below  Patient is leaving in good spirits. Plans to return home. Plans to follow up as above. Of note, patient plans to follow up with Ob/Gyn to follow up on best contraceptive management  Plans to follow up with PCP regarding (+) RPR for further follow up  Plans to follow up with outpatient cardiologist due to recent cardiac event Encouraged to maintain smoking cessation as integral part of treatment plans  Nehemiah Massed, MD 11/19/2015, 1:27 PM

## 2015-12-03 ENCOUNTER — Emergency Department (HOSPITAL_COMMUNITY): Payer: Medicaid Other

## 2015-12-03 ENCOUNTER — Emergency Department (HOSPITAL_COMMUNITY)
Admission: EM | Admit: 2015-12-03 | Discharge: 2015-12-03 | Disposition: A | Discharge status: Home / Self Care | Payer: Medicaid Other | Attending: Emergency Medicine | Admitting: Emergency Medicine

## 2015-12-03 ENCOUNTER — Encounter (HOSPITAL_COMMUNITY): Payer: Self-pay | Admitting: *Deleted

## 2015-12-03 DIAGNOSIS — R1013 Epigastric pain: Secondary | ICD-10-CM | POA: Diagnosis not present

## 2015-12-03 DIAGNOSIS — F17210 Nicotine dependence, cigarettes, uncomplicated: Secondary | ICD-10-CM | POA: Diagnosis not present

## 2015-12-03 DIAGNOSIS — E119 Type 2 diabetes mellitus without complications: Secondary | ICD-10-CM | POA: Insufficient documentation

## 2015-12-03 DIAGNOSIS — I252 Old myocardial infarction: Secondary | ICD-10-CM | POA: Diagnosis not present

## 2015-12-03 DIAGNOSIS — Z79899 Other long term (current) drug therapy: Secondary | ICD-10-CM | POA: Insufficient documentation

## 2015-12-03 DIAGNOSIS — I10 Essential (primary) hypertension: Secondary | ICD-10-CM | POA: Insufficient documentation

## 2015-12-03 DIAGNOSIS — R112 Nausea with vomiting, unspecified: Secondary | ICD-10-CM

## 2015-12-03 DIAGNOSIS — R110 Nausea: Secondary | ICD-10-CM | POA: Insufficient documentation

## 2015-12-03 DIAGNOSIS — R1011 Right upper quadrant pain: Secondary | ICD-10-CM

## 2015-12-03 HISTORY — DX: Unspecified abdominal pain: R10.9

## 2015-12-03 HISTORY — DX: Other chronic pain: G89.29

## 2015-12-03 HISTORY — DX: Pelvic and perineal pain: R10.2

## 2015-12-03 HISTORY — DX: Other specified postprocedural states: Z98.890

## 2015-12-03 LAB — CBC
HEMATOCRIT: 47.7 % — AB (ref 36.0–46.0)
Hemoglobin: 15.3 g/dL — ABNORMAL HIGH (ref 12.0–15.0)
MCH: 30.8 pg (ref 26.0–34.0)
MCHC: 32.1 g/dL (ref 30.0–36.0)
MCV: 96 fL (ref 78.0–100.0)
Platelets: 297 10*3/uL (ref 150–400)
RBC: 4.97 MIL/uL (ref 3.87–5.11)
RDW: 14.3 % (ref 11.5–15.5)
WBC: 12.6 10*3/uL — AB (ref 4.0–10.5)

## 2015-12-03 LAB — COMPREHENSIVE METABOLIC PANEL
ALBUMIN: 4.1 g/dL (ref 3.5–5.0)
ALK PHOS: 98 U/L (ref 38–126)
ALT: 27 U/L (ref 14–54)
ANION GAP: 8 (ref 5–15)
AST: 24 U/L (ref 15–41)
BILIRUBIN TOTAL: 0.2 mg/dL — AB (ref 0.3–1.2)
BUN: 8 mg/dL (ref 6–20)
CHLORIDE: 106 mmol/L (ref 101–111)
CO2: 27 mmol/L (ref 22–32)
Calcium: 9.1 mg/dL (ref 8.9–10.3)
Creatinine, Ser: 0.76 mg/dL (ref 0.44–1.00)
Glucose, Bld: 109 mg/dL — ABNORMAL HIGH (ref 65–99)
POTASSIUM: 4.3 mmol/L (ref 3.5–5.1)
Sodium: 141 mmol/L (ref 135–145)
TOTAL PROTEIN: 7 g/dL (ref 6.5–8.1)

## 2015-12-03 LAB — LIPASE, BLOOD: Lipase: 19 U/L (ref 11–51)

## 2015-12-03 LAB — URINALYSIS, ROUTINE W REFLEX MICROSCOPIC
Bilirubin Urine: NEGATIVE
GLUCOSE, UA: NEGATIVE mg/dL
Hgb urine dipstick: NEGATIVE
Ketones, ur: NEGATIVE mg/dL
LEUKOCYTES UA: NEGATIVE
NITRITE: NEGATIVE
PH: 6 (ref 5.0–8.0)
PROTEIN: NEGATIVE mg/dL
Specific Gravity, Urine: <1.005 — ABNORMAL LOW (ref 1.005–1.030)

## 2015-12-03 MED ORDER — METOPROLOL TARTRATE 25 MG PO TABS: 25.0000 mg | ORAL_TABLET | Freq: Two times a day (BID) | ORAL | Status: DC

## 2015-12-03 MED ORDER — DIATRIZOATE MEGLUMINE & SODIUM 66-10 % PO SOLN
ORAL | Status: AC
  Administered 2015-12-03: 21:00:00
  Filled 2015-12-03: qty 30

## 2015-12-03 MED ORDER — OXYCODONE-ACETAMINOPHEN 5-325 MG PO TABS
1.0000 | ORAL_TABLET | Freq: Once | ORAL | Status: AC
  Administered 2015-12-03: 1 via ORAL
  Filled 2015-12-03: qty 1

## 2015-12-03 MED ORDER — IOPAMIDOL (ISOVUE-300) INJECTION 61%
100.0000 mL | Freq: Once | INTRAVENOUS | Status: AC | PRN
  Administered 2015-12-03: 100 mL via INTRAVENOUS

## 2015-12-03 MED ORDER — SODIUM CHLORIDE 0.9 % IV BOLUS (SEPSIS)
1000.0000 mL | Freq: Once | INTRAVENOUS | Status: AC
  Administered 2015-12-03: 1000 mL via INTRAVENOUS

## 2015-12-03 MED ORDER — ONDANSETRON HCL 4 MG/2ML IJ SOLN
4.0000 mg | Freq: Once | INTRAMUSCULAR | Status: AC
  Administered 2015-12-03: 4 mg via INTRAVENOUS
  Filled 2015-12-03: qty 2

## 2015-12-03 MED ORDER — ONDANSETRON HCL 4 MG PO TABS
4.0000 mg | ORAL_TABLET | Freq: Three times a day (TID) | ORAL | Status: DC | PRN
Start: 2015-12-03 — End: 2015-12-19

## 2015-12-03 MED ORDER — HYDROMORPHONE HCL 1 MG/ML IJ SOLN
1.0000 mg | Freq: Once | INTRAMUSCULAR | Status: AC
  Administered 2015-12-03: 1 mg via INTRAVENOUS
  Filled 2015-12-03: qty 1

## 2015-12-03 NOTE — ED Notes (Signed)
Pt c/o ruq pain that radiates around to her back since yesterday. Pt reports nausea.

## 2015-12-03 NOTE — ED Notes (Signed)
Patient ambulated with assistance to restroom, stated that she was still sluggish from pain medication given earlier. Now states that her pain is a seven.

## 2015-12-03 NOTE — ED Notes (Signed)
Pt states understanding of care given and follow up instructions 

## 2015-12-03 NOTE — ED Provider Notes (Signed)
CSN: [id number]     Arrival date & time 12/03/15  1609 History   First MD Initiated Contact with Patient 12/03/15 1917     Chief Complaint  Patient presents with  . Abdominal Pain     (Consider location/radiation/quality/duration/timing/severity/associated sxs/prior Treatment) Patient is a 40 y.o. female presenting with abdominal pain.  Abdominal Pain Pain location:  Epigastric and RUQ Pain quality: aching and sharp   Pain severity:  Mild Onset quality:  Gradual Duration:  1 day Timing:  Constant Chronicity:  Recurrent Context: eating   Context: not alcohol use   Relieved by:  None tried Worsened by:  Nothing tried Ineffective treatments:  None tried Associated symptoms: nausea   Associated symptoms: no belching, no cough, no diarrhea, no fatigue, no fever, no shortness of breath and no vomiting   Risk factors: obesity     Past Medical History  Diagnosis Date  . Diabetes mellitus without complication (HCC)   . Hypertension   . Anxiety   . MI (myocardial infarction) (HCC)   . Chronic pelvic pain in female   . Chronic abdominal pain   . History of cardiac catheterization 10/2015    normal coronary arteries   Past Surgical History  Procedure Laterality Date  . Cesarean section    . Cardiac catheterization N/A 11/06/2015    Procedure: Left Heart Cath and Coronary Angiography;  Surgeon: Thurmon Fair, MD;  [facility];  Service: Cardiovascular;  Laterality: N/A;   No family history on file. Social History  Substance Use Topics  . Smoking status: Current Some Day Smoker -- 0.50 packs/day    Types: Cigarettes  . Smokeless tobacco: None  . Alcohol Use: No   OB History    Gravida Para Term Preterm AB TAB SAB Ectopic Multiple Living            2     Review of Systems  Constitutional: Negative for fever and fatigue.  Respiratory: Negative for cough and shortness of breath.   Gastrointestinal: Positive for nausea and abdominal pain. Negative for  vomiting and diarrhea.  All other systems reviewed and are negative.     Allergies  Ciprofloxacin; Penicillins; Amoxicillin; Codeine; Metronidazole; Sulfa antibiotics; and Toradol  Home Medications   Prior to Admission medications   Medication Sig Start Date End Date Taking? Authorizing Provider  acetaminophen (TYLENOL) 325 MG tablet Take 2 tablets (650 mg total) by mouth every 4 (four) hours as needed for headache or mild pain. 11/19/15  Yes Sanjuana Kava, NP  albuterol (PROAIR HFA) 108 (90 Base) MCG/ACT inhaler Inhale 2 puffs into the lungs every 6 (six) hours as needed for wheezing or shortness of breath. 11/19/15  Yes Sanjuana Kava, NP  clonazePAM (KLONOPIN) 0.5 MG tablet Take 1 tablet (0.5 mg total) by mouth 3 (three) times daily. For anxiety 11/19/15  Yes Sanjuana Kava, NP  gabapentin (NEURONTIN) 100 MG capsule Take 1 capsule (100 mg total) by mouth 3 (three) times daily. For agitation 11/19/15  Yes Sanjuana Kava, NP  mirtazapine (REMERON) 30 MG tablet Take 1 tablet (30 mg total) by mouth at bedtime. For depression/sleep 11/19/15  Yes Sanjuana Kava, NP  metoprolol tartrate (LOPRESSOR) 25 MG tablet Take 1 tablet (25 mg total) by mouth 2 (two) times daily. For high blood pressure 12/03/15   Marily Memos, MD  nicotine (NICODERM CQ - DOSED IN MG/24 HOURS) 21 mg/24hr patch Place 1 patch (21 mg total) onto the skin daily. For smokin cessation  11/19/15   Sanjuana Kava, NP  ondansetron (ZOFRAN) 4 MG tablet Take 1 tablet (4 mg total) by mouth every 8 (eight) hours as needed for nausea or vomiting. 12/03/15   Marily Memos, MD   BP 128/80 mmHg  Pulse 85  Temp(Src) 98.2 F (36.8 C) (Oral)  Resp 18  Ht  (1.6 m)  Wt 170 lb (77.111 kg)  BMI 30.12 kg/m2  SpO2 98%  LMP 11/19/2015 Physical Exam  Constitutional: She is oriented to person, place, and time. She appears well-developed and well-nourished.  HENT:  Head: Normocephalic and atraumatic.  Neck: Normal range of motion.  Cardiovascular:  Normal rate and regular rhythm.   Pulmonary/Chest: No stridor. No respiratory distress.  Abdominal: Soft. She exhibits no distension. There is no tenderness.  Musculoskeletal: Normal range of motion. She exhibits no edema or tenderness.  Neurological: She is alert and oriented to person, place, and time. No cranial nerve deficit.  Skin: Skin is warm and dry.  Nursing note and vitals reviewed.   ED Course  Procedures (including critical care time) Labs Review Labs Reviewed  COMPREHENSIVE METABOLIC PANEL - Abnormal; Notable for the following:    Glucose, Bld 109 (*)    Total Bilirubin 0.2 (*)    All other components within normal limits  CBC - Abnormal; Notable for the following:    WBC 12.6 (*)    Hemoglobin 15.3 (*)    HCT 47.7 (*)    All other components within normal limits  URINALYSIS, ROUTINE W REFLEX MICROSCOPIC (NOT AT [facility]) - Abnormal; Notable for the following:    Specific Gravity, Urine <1.005 (*)    All other components within normal limits  LIPASE, BLOOD    Imaging Review Ct Abdomen Pelvis W Contrast  12/03/2015  CLINICAL DATA:  Patient with right upper quadrant pain radiating to the back. EXAM: CT ABDOMEN AND PELVIS WITH CONTRAST TECHNIQUE: Multidetector CT imaging of the abdomen and pelvis was performed using the standard protocol following bolus administration of intravenous contrast. CONTRAST:  ISOVUE-300 IOPAMIDOL (ISOVUE-300) INJECTION 61% COMPARISON:  CT renal stone 11/10/2015 FINDINGS: Lower chest: Normal heart size. Lung bases are clear. No pleural effusion. Hepatobiliary: Liver is normal in size and contour. No focal lesion identified. Gallbladder is unremarkable. Pancreas: Unremarkable Spleen: Unremarkable Adrenals/Urinary Tract: Adrenal glands are normal. Kidneys enhance symmetrically with contrast. No hydronephrosis. Urinary bladder is unremarkable. Stomach/Bowel: No abnormal bowel wall thickening or evidence for bowel obstruction. No free fluid or free  intraperitoneal air. Vascular/Lymphatic: Normal caliber abdominal aorta. No retroperitoneal lymphadenopathy. Other: Uterus and adnexal structures are unremarkable. Musculoskeletal: No aggressive or acute appearing osseous lesions. IMPRESSION: No acute process within the abdomen or pelvis. Electronically Signed   By: Annia Belt M.D.   On: 12/03/2015 21:51   I have personally reviewed and evaluated these images and lab results as part of my medical decision-making.   EKG Interpretation None      MDM   Final diagnoses:  Epigastric pain    Epigastric and RUQ pain for last few weeks, worse with fatty meals. Has had some nausea with it as well. Exam bening. Workup benign. Still could possibly be biliary colic so will get [place] tomorrow, if negative will continue workup with new PCP.   New Prescriptions: Discharge Medication List as of 12/03/2015 10:57 PM    START taking these medications   Details  ondansetron (ZOFRAN) 4 MG tablet Take 1 tablet (4 mg total) by mouth every 8 (eight) hours as needed for  nausea or vomiting., Starting 12/03/2015, Until Discontinued, Print         I have personally and contemperaneously reviewed labs and imaging and used in my decision making as above.   A medical screening exam was performed and I feel the patient has had an appropriate workup for their chief complaint at this time and likelihood of emergent condition existing is low and thus workup can continue on an outpatient basis.. Their vital signs are stable. They have been counseled on decision, discharge, follow up and which symptoms necessitate immediate return to the emergency department.  They verbally stated understanding and agreement with plan and discharged in stable condition.      Marily Memos, MD 12/04/15 0000

## 2015-12-04 ENCOUNTER — Other Ambulatory Visit (HOSPITAL_COMMUNITY): Payer: Self-pay

## 2015-12-04 ENCOUNTER — Ambulatory Visit (HOSPITAL_COMMUNITY): Admit: 2015-12-04 | Payer: MEDICAID

## 2015-12-05 ENCOUNTER — Emergency Department (HOSPITAL_COMMUNITY)
Admission: EM | Admit: 2015-12-05 | Discharge: 2015-12-05 | Disposition: A | Discharge status: Home / Self Care | Payer: Medicaid Other | Attending: Emergency Medicine | Admitting: Emergency Medicine

## 2015-12-05 ENCOUNTER — Emergency Department (HOSPITAL_COMMUNITY): Payer: Medicaid Other

## 2015-12-05 ENCOUNTER — Encounter (HOSPITAL_COMMUNITY): Payer: Self-pay | Admitting: *Deleted

## 2015-12-05 DIAGNOSIS — F419 Anxiety disorder, unspecified: Secondary | ICD-10-CM | POA: Diagnosis present

## 2015-12-05 DIAGNOSIS — I10 Essential (primary) hypertension: Secondary | ICD-10-CM | POA: Insufficient documentation

## 2015-12-05 DIAGNOSIS — Z79899 Other long term (current) drug therapy: Secondary | ICD-10-CM | POA: Diagnosis not present

## 2015-12-05 DIAGNOSIS — I252 Old myocardial infarction: Secondary | ICD-10-CM | POA: Diagnosis not present

## 2015-12-05 DIAGNOSIS — F17210 Nicotine dependence, cigarettes, uncomplicated: Secondary | ICD-10-CM | POA: Diagnosis not present

## 2015-12-05 DIAGNOSIS — E119 Type 2 diabetes mellitus without complications: Secondary | ICD-10-CM | POA: Diagnosis not present

## 2015-12-05 LAB — COMPREHENSIVE METABOLIC PANEL
ALBUMIN: 4.3 g/dL (ref 3.5–5.0)
ALK PHOS: 103 U/L (ref 38–126)
ALT: 26 U/L (ref 14–54)
AST: 23 U/L (ref 15–41)
Anion gap: 3 — ABNORMAL LOW (ref 5–15)
BUN: 8 mg/dL (ref 6–20)
CALCIUM: 9 mg/dL (ref 8.9–10.3)
CHLORIDE: 107 mmol/L (ref 101–111)
CO2: 26 mmol/L (ref 22–32)
CREATININE: 0.72 mg/dL (ref 0.44–1.00)
GFR calc non Af Amer: >60 mL/min (ref >60)
GLUCOSE: 82 mg/dL (ref 65–99)
Potassium: 4.6 mmol/L (ref 3.5–5.1)
SODIUM: 136 mmol/L (ref 135–145)
Total Bilirubin: 0.4 mg/dL (ref 0.3–1.2)
Total Protein: 7.2 g/dL (ref 6.5–8.1)

## 2015-12-05 LAB — CBC WITH DIFFERENTIAL/PLATELET
Basophils Absolute: 0.1 10*3/uL (ref 0.0–0.1)
Basophils Relative: 1 %
EOS ABS: 0.2 10*3/uL (ref 0.0–0.7)
EOS PCT: 1 %
HCT: 46.4 % — ABNORMAL HIGH (ref 36.0–46.0)
HEMOGLOBIN: 14.7 g/dL (ref 12.0–15.0)
LYMPHS ABS: 2.6 10*3/uL (ref 0.7–4.0)
LYMPHS PCT: 21 %
MCH: 30.1 pg (ref 26.0–34.0)
MCHC: 31.7 g/dL (ref 30.0–36.0)
MCV: 95.1 fL (ref 78.0–100.0)
MONOS PCT: 5 %
Monocytes Absolute: 0.7 10*3/uL (ref 0.1–1.0)
Neutro Abs: 9.2 10*3/uL — ABNORMAL HIGH (ref 1.7–7.7)
Neutrophils Relative %: 72 %
PLATELETS: 311 10*3/uL (ref 150–400)
RBC: 4.88 MIL/uL (ref 3.87–5.11)
RDW: 14.2 % (ref 11.5–15.5)
WBC: 12.8 10*3/uL — AB (ref 4.0–10.5)

## 2015-12-05 LAB — URINALYSIS, ROUTINE W REFLEX MICROSCOPIC
Bilirubin Urine: NEGATIVE
GLUCOSE, UA: NEGATIVE mg/dL
Hgb urine dipstick: NEGATIVE
KETONES UR: NEGATIVE mg/dL
LEUKOCYTES UA: NEGATIVE
NITRITE: NEGATIVE
PROTEIN: NEGATIVE mg/dL
Specific Gravity, Urine: <1.005 — ABNORMAL LOW (ref 1.005–1.030)
pH: 7 (ref 5.0–8.0)

## 2015-12-05 LAB — RAPID URINE DRUG SCREEN, HOSP PERFORMED
AMPHETAMINES: NOT DETECTED
BARBITURATES: NOT DETECTED
Benzodiazepines: POSITIVE — AB
Cocaine: NOT DETECTED
Opiates: NOT DETECTED
TETRAHYDROCANNABINOL: NOT DETECTED

## 2015-12-05 LAB — PREGNANCY, URINE: Preg Test, Ur: NEGATIVE

## 2015-12-05 LAB — ETHANOL: Alcohol, Ethyl (B): <5 mg/dL (ref <5)

## 2015-12-05 MED ORDER — CLONAZEPAM 0.5 MG PO TABS
0.5000 mg | ORAL_TABLET | Freq: Once | ORAL | Status: AC
  Administered 2015-12-05: 0.5 mg via ORAL
  Filled 2015-12-05: qty 1

## 2015-12-05 MED ORDER — CLONAZEPAM 0.5 MG PO TABS: 0.5000 mg | ORAL_TABLET | Freq: Three times a day (TID) | ORAL | Status: DC | PRN

## 2015-12-05 MED ORDER — HYDROCODONE-ACETAMINOPHEN 5-325 MG PO TABS
1.0000 | ORAL_TABLET | Freq: Once | ORAL | Status: AC
  Administered 2015-12-05: 1 via ORAL
  Filled 2015-12-05: qty 1
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
## 2015-12-05 NOTE — ED Provider Notes (Signed)
CSN: [id number]     Arrival date & time 12/05/15  1504 History   First MD Initiated Contact with Patient 12/05/15 1530     Chief Complaint  Patient presents with  . Chest Pain  . Anxiety  PT IS HERE WITH CP AND ANXIETY.  SHE SAID THAT SHE CALLED [employer] B/C OF THE ANXIETY.  SHE DENIES SI/HI, BUT [employer] SENT OUT THE POLICE TO HER HOUSE TO BRING HERE HERE.   PT HAS BEEN OUT OF HER KLONOPIN FOR 2 DAYS.  THE PT HAD AN ELEVATED TROPONIN IN April.  SHE HAD A CATH WHICH SHOWED NL CORONARY ARTERIES.  IT WAS THOUGHT THAT SHE POSSIBLY HAD TAKOTSUBO CM ALTHOUGH THERE WAS NL LV FCT AND NO WALL MOTION DEFECTS.  THE PT WAS ADMITTED TO BH 4/23 TO 4/27 FOR HER ANXIETY AND MOOD SWINGS.  PT SAID THE MOOD SWINGS ARE NO BETTER.  PT WAS IN THE ED 2 DAYS AGO FOR NAUSEA.  AN OUTPATIENT GB US AND HIDA SCAN WAS ORDERED BUT PT WAS A NO SHOW.  SHE TOLD ME THAT SHE DID NOT REMEMBER BEING TOLD ABOUT THOSE STUDIES.     (Consider location/radiation/quality/duration/timing/severity/associated sxs/prior Treatment) The history is provided by the patient.    Past Medical History  Diagnosis Date  . Diabetes mellitus without complication (HCC)   . Hypertension   . Anxiety   . MI (myocardial infarction) (HCC)   . Chronic pelvic pain in female   . Chronic abdominal pain   . History of cardiac catheterization 10/2015    normal coronary arteries   Past Surgical History  Procedure Laterality Date  . Cesarean section    . Cardiac catheterization N/A 11/06/2015    Procedure: Left Heart Cath and Coronary Angiography;  Surgeon: Thurmon Fair, MD;  Location: [facility];  Service: Cardiovascular;  Laterality: N/A;   No family history on file. Social History  Substance Use Topics  . Smoking status: Current Every Day Smoker -- 0.50 packs/day for 5 years    Types: Cigarettes  . Smokeless tobacco: None  . Alcohol Use: No   OB History    Gravida Para Term Preterm AB TAB SAB Ectopic Multiple Living             2     Review of Systems  Psychiatric/Behavioral: The patient is nervous/anxious.   All other systems reviewed and are negative.     Allergies  Ciprofloxacin; Penicillins; Amoxicillin; Codeine; Metronidazole; Sulfa antibiotics; and Toradol  Home Medications   Prior to Admission medications   Medication Sig Start Date End Date Taking? Authorizing Provider  albuterol (PROAIR HFA) 108 (90 Base) MCG/ACT inhaler Inhale 2 puffs into the lungs every 6 (six) hours as needed for wheezing or shortness of breath. 11/19/15  Yes Sanjuana Kava, NP  clonazePAM (KLONOPIN) 0.5 MG tablet Take 1 tablet (0.5 mg total) by mouth 3 (three) times daily. For anxiety 11/19/15  Yes Sanjuana Kava, NP  gabapentin (NEURONTIN) 100 MG capsule Take 1 capsule (100 mg total) by mouth 3 (three) times daily. For agitation 11/19/15  Yes Sanjuana Kava, NP  metoprolol tartrate (LOPRESSOR) 25 MG tablet Take 1 tablet (25 mg total) by mouth 2 (two) times daily. For high blood pressure 12/03/15  Yes Marily Memos, MD  mirtazapine (REMERON) 30 MG tablet Take 1 tablet (30 mg total) by mouth at bedtime. For depression/sleep 11/19/15  Yes Sanjuana Kava, NP  acetaminophen (TYLENOL) 325 MG tablet Take 2 tablets (650  mg total) by mouth every 4 (four) hours as needed for headache or mild pain. 11/19/15   Agnes I Nwoko, NP  clonazePAM (KLONOPIN) 0.5 MG tablet Take 1 tablet (0.5 mg total) by mouth 3 (three) times daily as needed for anxiety. 12/05/15   Marcille Barman, MD  nicotine (NICODERM CQ - DOSED IN MG/24 HOURS) 21 mg/24hr patch Place 1 patch (21 mg total) onto the skin daily. For smokin cessation 11/19/15   Agnes I Nwoko, NP  ondansetron (ZOFRAN) 4 MG tablet Take 1 tablet (4 mg total) by mouth every 8 (eight) hours as needed for nausea or vomiting. 12/03/15   Jason Mesner, MD   BP 115/72 mmHg  Pulse 80  Temp(Src) 98.9 F (37.2 C) (Oral)  Resp 16  Ht 5\' 3"  (1.6 m)  Wt 170 lb (77.111 kg)  BMI 30.12 kg/m2  SpO2 100%  LMP 11/19/2015 Physical  Exam  Constitutional: She is oriented to person, place, and time. She appears well-developed and well-nourished.  HENT:  Head: Normocephalic and atraumatic.  Right Ear: External ear normal.  Left Ear: External ear normal.  Mouth/Throat: Oropharynx is clear and moist.  Eyes: Conjunctivae and EOM are normal. Pupils are equal, round, and reactive to light.  Neck: Normal range of motion. Neck supple.  Cardiovascular: Normal rate, regular rhythm, normal heart sounds and intact distal pulses.   Pulmonary/Chest: Effort normal and breath sounds normal.  Abdominal: Soft. Bowel sounds are normal.  Musculoskeletal: Normal range of motion.  Neurological: She is alert and oriented to person, place, and time.  Skin: Skin is warm and dry.  Psychiatric: Her speech is normal and behavior is normal. Judgment and [id number] FC ower chest: Normal heart size. Lung bases are clear. No pleural effusion.  Hepatobiliary: Liver is normal in size and contour. No focal lesion identified. Gallbladder is unremarkable. Pancreas: Unremarkable Spleen: Unremarkable Adrenals/Urinary Tract: Adrenal glands are normal. Kidneys enhance symmetrically with contrast. No hydronephrosis. Urinary bladder is unremarkable. Stomach/Bowel: No abnormal bowel wall thickening or evidence for bowel obstruction. No free fluid or free intraperitoneal air. Vascular/Lymphatic: Normal caliber abdominal aorta. No retroperitoneal lymphadenopathy. Other: Uterus and adnexal structures are unremarkable. Musculoskeletal: No aggressive or acute appearing osseous lesions. IMPRESSION: No acute process within the abdomen or pelvis. Electronically Signed   By: Annia Belt M.D.   On: 12/03/2015 21:51   I have personally reviewed and evaluated these images and lab results as part of my medical decision-making.   EKG Interpretation None      MDM  PT D/W TTS.  PT HAS AN APPT WITH [facility] ON MAY 15TH.   PT IS OK FOR D/C.  SHE WILL BE GIVEN ENOUGH KLONOPIN TO LAST HER UNTIL Monday.  PT WILL BE INSTR THAT IN THE FUTURE ANY CHRONIC MEDS NEED TO BE RX'D BY PCP.  PT OK WITH THIS PLAN. Final diagnoses:  Anxiety      Jacalyn Lefevre,  MD 12/05/15 [phone]

## 2015-12-05 NOTE — ED Notes (Addendum)
Pt brought in by [facility] after calling the mobile crisis hotline due to feeling anxious, having left sided chest pain and feeling SOB since she ran out of her Klonopin 2 days ago. Pt normally takes Klonopin three times daily and has been out for 2 days. 12 lead EKG WNL per EMS. 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 Pt was at [facility] recently for medication adjustment with depression. Pt denies SI, HI. Pt also c/o dental pain that she was given antibiotics for when she was at [facility] but she is allergic to the antibiotics and hasn't been taking them.

## 2015-12-07 ENCOUNTER — Emergency Department (HOSPITAL_COMMUNITY): Payer: Medicaid Other

## 2015-12-07 ENCOUNTER — Encounter (HOSPITAL_COMMUNITY): Payer: Self-pay

## 2015-12-07 ENCOUNTER — Emergency Department (HOSPITAL_COMMUNITY)
Admission: EM | Admit: 2015-12-07 | Discharge: 2015-12-07 | Disposition: A | Discharge status: Home / Self Care | Payer: Medicaid Other | Attending: Emergency Medicine | Admitting: Emergency Medicine

## 2015-12-07 DIAGNOSIS — E119 Type 2 diabetes mellitus without complications: Secondary | ICD-10-CM | POA: Diagnosis not present

## 2015-12-07 DIAGNOSIS — I10 Essential (primary) hypertension: Secondary | ICD-10-CM | POA: Insufficient documentation

## 2015-12-07 DIAGNOSIS — Z79899 Other long term (current) drug therapy: Secondary | ICD-10-CM | POA: Diagnosis not present

## 2015-12-07 DIAGNOSIS — F17210 Nicotine dependence, cigarettes, uncomplicated: Secondary | ICD-10-CM | POA: Diagnosis not present

## 2015-12-07 DIAGNOSIS — R1110 Vomiting, unspecified: Secondary | ICD-10-CM | POA: Insufficient documentation

## 2015-12-07 DIAGNOSIS — R1013 Epigastric pain: Secondary | ICD-10-CM | POA: Diagnosis not present

## 2015-12-07 DIAGNOSIS — I252 Old myocardial infarction: Secondary | ICD-10-CM | POA: Insufficient documentation

## 2015-12-07 DIAGNOSIS — R1011 Right upper quadrant pain: Secondary | ICD-10-CM | POA: Insufficient documentation

## 2015-12-07 DIAGNOSIS — R109 Unspecified abdominal pain: Secondary | ICD-10-CM

## 2015-12-07 LAB — URINALYSIS, ROUTINE W REFLEX MICROSCOPIC
Bilirubin Urine: NEGATIVE
GLUCOSE, UA: NEGATIVE mg/dL
HGB URINE DIPSTICK: NEGATIVE
KETONES UR: NEGATIVE mg/dL
Leukocytes, UA: NEGATIVE
Nitrite: NEGATIVE
PROTEIN: NEGATIVE mg/dL
Specific Gravity, Urine: 1.01 (ref 1.005–1.030)
pH: 7.5 (ref 5.0–8.0)

## 2015-12-07 LAB — COMPREHENSIVE METABOLIC PANEL
ALT: 27 U/L (ref 14–54)
ANION GAP: 6 (ref 5–15)
AST: 22 U/L (ref 15–41)
Albumin: 4.6 g/dL (ref 3.5–5.0)
Alkaline Phosphatase: 114 U/L (ref 38–126)
BUN: 8 mg/dL (ref 6–20)
CALCIUM: 9.4 mg/dL (ref 8.9–10.3)
CHLORIDE: 106 mmol/L (ref 101–111)
CO2: 27 mmol/L (ref 22–32)
Creatinine, Ser: 0.76 mg/dL (ref 0.44–1.00)
GFR calc non Af Amer: >60 mL/min (ref >60)
Glucose, Bld: 72 mg/dL (ref 65–99)
POTASSIUM: 4 mmol/L (ref 3.5–5.1)
SODIUM: 139 mmol/L (ref 135–145)
Total Bilirubin: 0.5 mg/dL (ref 0.3–1.2)
Total Protein: 7.9 g/dL (ref 6.5–8.1)

## 2015-12-07 LAB — CBC WITH DIFFERENTIAL/PLATELET
BASOS ABS: 0.1 10*3/uL (ref 0.0–0.1)
BASOS PCT: 0 %
EOS PCT: 2 %
Eosinophils Absolute: 0.2 10*3/uL (ref 0.0–0.7)
HCT: 49.6 % — ABNORMAL HIGH (ref 36.0–46.0)
Hemoglobin: 15.1 g/dL — ABNORMAL HIGH (ref 12.0–15.0)
Lymphocytes Relative: 21 %
Lymphs Abs: 2.9 10*3/uL (ref 0.7–4.0)
MCH: 29 pg (ref 26.0–34.0)
MCHC: 30.4 g/dL (ref 30.0–36.0)
MCV: 95.4 fL (ref 78.0–100.0)
MONO ABS: 0.6 10*3/uL (ref 0.1–1.0)
Monocytes Relative: 4 %
Neutro Abs: 10.5 10*3/uL — ABNORMAL HIGH (ref 1.7–7.7)
Neutrophils Relative %: 73 %
PLATELETS: 309 10*3/uL (ref 150–400)
RBC: 5.2 MIL/uL — ABNORMAL HIGH (ref 3.87–5.11)
RDW: 14.1 % (ref 11.5–15.5)
WBC: 14.3 10*3/uL — ABNORMAL HIGH (ref 4.0–10.5)

## 2015-12-07 LAB — LIPASE, BLOOD: LIPASE: 17 U/L (ref 11–51)

## 2015-12-07 MED ORDER — SODIUM CHLORIDE 0.9 % IV BOLUS (SEPSIS)
1000.0000 mL | Freq: Once | INTRAVENOUS | Status: AC
  Administered 2015-12-07: 1000 mL via INTRAVENOUS

## 2015-12-07 MED ORDER — ONDANSETRON HCL 4 MG/2ML IJ SOLN
4.0000 mg | Freq: Once | INTRAMUSCULAR | Status: AC
  Administered 2015-12-07: 4 mg via INTRAVENOUS
  Filled 2015-12-07: qty 2

## 2015-12-07 MED ORDER — MORPHINE SULFATE (PF) 2 MG/ML IV SOLN
2.0000 mg | Freq: Once | INTRAVENOUS | Status: AC
  Administered 2015-12-07: 2 mg via INTRAVENOUS
  Filled 2015-12-07: qty 1

## 2015-12-07 NOTE — ED Provider Notes (Signed)
CSN: [id number]     Arrival date & time 12/07/15  1235 History   First MD Initiated Contact with Patient 12/07/15 1309     Chief Complaint  Patient presents with  . Abdominal Pain     (Consider location/radiation/quality/duration/timing/severity/associated sxs/prior Treatment) HPI Comments: Patient is a 40 year old female with history of chronic abdominal and chest pain. She presents for evaluation of right upper quadrant pain and vomiting which is been ongoing for the past week. She was seen here several days ago and had a CT scan performed which was unremarkable. She was due to have an outpatient ultrasound and HIDA scan, however she did not show up for the studies. She returns today complaining of ongoing pain, bilious vomiting. She denies fevers or chills.  Patient is a 40 y.o. female presenting with abdominal pain. The history is provided by the patient.  Abdominal Pain Pain location:  Epigastric and RUQ Pain quality: cramping   Pain radiates to:  Does not radiate Pain severity:  Moderate Onset quality:  Gradual Duration:  1 week Timing:  Intermittent Progression:  Worsening Chronicity:  New Relieved by:  Nothing Worsened by:  Eating Ineffective treatments:  None tried   Past Medical History  Diagnosis Date  . Diabetes mellitus without complication (HCC)   . Hypertension   . Anxiety   . MI (myocardial infarction) (HCC)   . Chronic pelvic pain in female   . Chronic abdominal pain   . History of cardiac catheterization 10/2015    normal coronary arteries   Past Surgical History  Procedure Laterality Date  . Cesarean section    . Cardiac catheterization N/A 11/06/2015    Procedure: Left Heart Cath and Coronary Angiography;  Surgeon: Thurmon Fair, MD;  Location: [facility];  Service: Cardiovascular;  Laterality: N/A;   No family history on file. Social History  Substance Use Topics  . Smoking status: Current Every Day Smoker -- 0.50 packs/day for 5 years   Types: Cigarettes  . Smokeless tobacco: None  . Alcohol Use: No   OB History    Gravida Para Term Preterm AB TAB SAB Ectopic Multiple Living            2     Review of Systems  Gastrointestinal: Positive for abdominal pain.  All other systems reviewed and are negative.     Allergies  Ciprofloxacin; Penicillins; Amoxicillin; Codeine; Metronidazole; Sulfa antibiotics; and Toradol  Home Medications   Prior to Admission medications   Medication Sig Start Date End Date Taking? Authorizing Provider  acetaminophen (TYLENOL) 325 MG tablet Take 2 tablets (650 mg total) by mouth every 4 (four) hours as needed for headache or mild pain. 11/19/15   Sanjuana Kava, NP  albuterol [facility] HFA) 108 [phone] Base) MCG/ACT inhaler Inhale 2 puffs into the lungs every 6 (six) hours as needed for wheezing or shortness of breath. 11/19/15   Sanjuana Kava, NP  clonazePAM (KLONOPIN) 0.5 MG tablet Take 1 tablet (0.5 mg total) by mouth 3 (three) times daily. For anxiety 11/19/15   Sanjuana Kava, NP  clonazePAM (KLONOPIN) 0.5 MG tablet Take 1 tablet (0.5 mg total) by mouth 3 (three) times daily as needed for anxiety. 12/05/15   Jacalyn Lefevre, MD  gabapentin (NEURONTIN) 100 MG capsule Take 1 capsule (100 mg total) by mouth 3 (three) times daily. For agitation 11/19/15   Sanjuana Kava, NP  metoprolol tartrate (LOPRESSOR) 25 MG tablet Take 1 tablet (25 mg total) by mouth 2 (two)  times daily. For high blood pressure 12/03/15   Marily Memos, MD  mirtazapine (REMERON) 30 MG tablet Take 1 tablet (30 mg total) by mouth at bedtime. For depression/sleep 11/19/15   Sanjuana Kava, NP  nicotine (NICODERM CQ - DOSED IN MG/24 HOURS) 21 mg/24hr patch Place 1 patch (21 mg total) onto the skin daily. For smokin cessation 11/19/15   Sanjuana Kava, NP  ondansetron (ZOFRAN) 4 MG tablet Take 1 tablet (4 mg total) by mouth every 8 (eight) hours as needed for nausea or vomiting. 12/03/15   Marily Memos, MD   BP 130/81 mmHg  Pulse 86  Temp(Src)  98.2 F (36.8 C) (Oral)  Resp 20  Ht 5\' 3"  (1.6 m)  Wt 170 lb (77.111 kg)  BMI 30.12 kg/m2  SpO2 100%  LMP 11/19/2015 Physical Exam  Constitutional: She is oriented to person, place, and time. She appears well-developed and well-nourished. No distress.  HENT:  Head: Normocephalic and atraumatic.  Neck: Normal range of motion. Neck supple.  Cardiovascular: Normal rate and regular rhythm.  Exam reveals no gallop and no friction rub.   No murmur heard. Pulmonary/Chest: Effort normal and breath sounds normal. No respiratory distress. She has no wheezes.  Abdominal: Soft. Bowel sounds are normal. She exhibits no distension. There is tenderness. There is no rebound and no guarding.  There is tenderness to palpation in the epigastrium and right upper quadrant. There is no rebound and no guarding.  Musculoskeletal: Normal range of motion.  Neurological: She is alert and oriented to person, place, and time.  Skin: Skin is warm and dry. She is not diaphoretic.  Nursing note and vitals reviewed.   ED Course  Procedures (including critical care time) Labs Review Labs Reviewed  LIPASE, BLOOD  COMPREHENSIVE METABOLIC PANEL  URINALYSIS, ROUTINE W REFLEX MICROSCOPIC (NOT AT [facility])  CBC WITH DIFFERENTIAL/PLATELET    Imaging Review Dg Chest 2 View  12/05/2015  CLINICAL DATA:  Anxious feelings. Left-sided chest pain. Shortness of breath. EXAM: CHEST  2 VIEW COMPARISON:  11/14/2015 and previous FINDINGS: Heart size is normal. Mediastinal shadows are normal. The lungs are clear. There is mild emphysema. No effusions. No bony abnormality. IMPRESSION: No active disease.  Emphysema. Electronically Signed   By: Paulina Fusi M.D.   On: 12/05/2015 17:12   I have personally reviewed and evaluated these images and lab results as part of my medical decision-making.    MDM   Final diagnoses:  Abdominal pain    Patient presents with complaints of right upper quadrant abdominal pain. She's been having  this for many months. She has been evaluated in the ER on several occasions, however no cause is been found. She had a CT scan several days ago which was negative and today's ultrasound is normal. I am uncertain as to what is causing her discomfort, however nothing appears emergent. I've advised her to follow-up with a gastroenterologist and have provided her with the contact information for Dr. Jena Gauss with him she can arrange a follow-up appointment.    Geoffery Lyons, MD 12/07/15 1500

## 2015-12-07 NOTE — Discharge Instructions (Signed)
Ibuprofen 600 mg every 6 hours as needed for pain.  Follow-up with gastroenterology. The contact information for Dr. Jena Gauss has been provided in this discharge summary for you to call and arrange this appointment.   Abdominal Pain, Adult Many things can cause abdominal pain. Usually, abdominal pain is not caused by a disease and will improve without treatment. It can often be observed and treated at home. Your health care provider will do a physical exam and possibly order blood tests and X-rays to help determine the seriousness of your pain. However, in many cases, more time must pass before a clear cause of the pain can be found. Before that point, your health care provider may not know if you need more testing or further treatment. HOME CARE INSTRUCTIONS Monitor your abdominal pain for any changes. The following actions may help to alleviate any discomfort you are experiencing:  Only take over-the-counter or prescription medicines as directed by your health care provider.  Do not take laxatives unless directed to do so by your health care provider.  Try a clear liquid diet (broth, tea, or water) as directed by your health care provider. Slowly move to a bland diet as tolerated. SEEK MEDICAL CARE IF:  You have unexplained abdominal pain.  You have abdominal pain associated with nausea or diarrhea.  You have pain when you urinate or have a bowel movement.  You experience abdominal pain that wakes you in the night.  You have abdominal pain that is worsened or improved by eating food.  You have abdominal pain that is worsened with eating fatty foods.  You have a fever. SEEK IMMEDIATE MEDICAL CARE IF:  Your pain does not go away within 2 hours.  You keep throwing up (vomiting).  Your pain is felt only in portions of the abdomen, such as the right side or the left lower portion of the abdomen.  You pass bloody or black tarry stools. MAKE SURE YOU:  Understand these  instructions.  Will watch your condition.  Will get help right away if you are not doing well or get worse.   This information is not intended to replace advice given to you by your health care provider. Make sure you discuss any questions you have with your health care provider.   Document Released: 04/20/2005 Document Revised: 04/01/2015 Document Reviewed: 03/20/2013 Elsevier Interactive Patient Education [employer]

## 2015-12-07 NOTE — ED Notes (Signed)
Pt reports right upper quadrant pain since 12/05/14. Brought in by EMS. Pt reports that pain increased last night after eating fried chicken. Reports vomiting green bile

## 2015-12-08 ENCOUNTER — Emergency Department (HOSPITAL_COMMUNITY)
Admission: EM | Admit: 2015-12-08 | Discharge: 2015-12-08 | Disposition: A | Discharge status: Home / Self Care | Payer: Medicaid Other | Attending: Emergency Medicine | Admitting: Emergency Medicine

## 2015-12-08 ENCOUNTER — Emergency Department (HOSPITAL_COMMUNITY): Payer: Medicaid Other

## 2015-12-08 ENCOUNTER — Encounter (HOSPITAL_COMMUNITY): Payer: Self-pay

## 2015-12-08 DIAGNOSIS — Z79899 Other long term (current) drug therapy: Secondary | ICD-10-CM | POA: Insufficient documentation

## 2015-12-08 DIAGNOSIS — I10 Essential (primary) hypertension: Secondary | ICD-10-CM | POA: Insufficient documentation

## 2015-12-08 DIAGNOSIS — R52 Pain, unspecified: Secondary | ICD-10-CM

## 2015-12-08 DIAGNOSIS — R109 Unspecified abdominal pain: Secondary | ICD-10-CM

## 2015-12-08 DIAGNOSIS — E119 Type 2 diabetes mellitus without complications: Secondary | ICD-10-CM | POA: Insufficient documentation

## 2015-12-08 DIAGNOSIS — I252 Old myocardial infarction: Secondary | ICD-10-CM | POA: Diagnosis not present

## 2015-12-08 DIAGNOSIS — F17210 Nicotine dependence, cigarettes, uncomplicated: Secondary | ICD-10-CM | POA: Diagnosis not present

## 2015-12-08 DIAGNOSIS — R1030 Lower abdominal pain, unspecified: Secondary | ICD-10-CM | POA: Diagnosis not present

## 2015-12-08 LAB — URINALYSIS, ROUTINE W REFLEX MICROSCOPIC
BILIRUBIN URINE: NEGATIVE
Glucose, UA: NEGATIVE mg/dL
HGB URINE DIPSTICK: NEGATIVE
Leukocytes, UA: NEGATIVE
Nitrite: NEGATIVE
PH: 5.5 (ref 5.0–8.0)
Protein, ur: NEGATIVE mg/dL
SPECIFIC GRAVITY, URINE: 1.025 (ref 1.005–1.030)

## 2015-12-08 LAB — COMPREHENSIVE METABOLIC PANEL
ALK PHOS: 100 U/L (ref 38–126)
ALT: 25 U/L (ref 14–54)
AST: 21 U/L (ref 15–41)
Albumin: 4.1 g/dL (ref 3.5–5.0)
Anion gap: 5 (ref 5–15)
BILIRUBIN TOTAL: 0.6 mg/dL (ref 0.3–1.2)
BUN: 8 mg/dL (ref 6–20)
CALCIUM: 9.2 mg/dL (ref 8.9–10.3)
CO2: 27 mmol/L (ref 22–32)
CREATININE: 0.81 mg/dL (ref 0.44–1.00)
Chloride: 106 mmol/L (ref 101–111)
GFR calc Af Amer: >60 mL/min (ref >60)
Glucose, Bld: 92 mg/dL (ref 65–99)
Potassium: 4.4 mmol/L (ref 3.5–5.1)
Sodium: 138 mmol/L (ref 135–145)
TOTAL PROTEIN: 6.8 g/dL (ref 6.5–8.1)

## 2015-12-08 LAB — CBC WITH DIFFERENTIAL/PLATELET
BASOS ABS: 0.1 10*3/uL (ref 0.0–0.1)
Basophils Relative: 0 %
Eosinophils Absolute: 0.2 10*3/uL (ref 0.0–0.7)
Eosinophils Relative: 1 %
HEMATOCRIT: 44.4 % (ref 36.0–46.0)
HEMOGLOBIN: 13.8 g/dL (ref 12.0–15.0)
LYMPHS PCT: 18 %
Lymphs Abs: 2.4 10*3/uL (ref 0.7–4.0)
MCH: 29.7 pg (ref 26.0–34.0)
MCHC: 31.1 g/dL (ref 30.0–36.0)
MCV: 95.7 fL (ref 78.0–100.0)
MONO ABS: 0.5 10*3/uL (ref 0.1–1.0)
Monocytes Relative: 3 %
NEUTROS ABS: 10.3 10*3/uL — AB (ref 1.7–7.7)
Neutrophils Relative %: 78 %
Platelets: 273 10*3/uL (ref 150–400)
RBC: 4.64 MIL/uL (ref 3.87–5.11)
RDW: 14.1 % (ref 11.5–15.5)
WBC: 13.4 10*3/uL — ABNORMAL HIGH (ref 4.0–10.5)

## 2015-12-08 LAB — I-STAT BETA HCG BLOOD, ED (MC, WL, AP ONLY)

## 2015-12-08 MED ORDER — HYDROCODONE-ACETAMINOPHEN 5-325 MG PO TABS
1.0000 | ORAL_TABLET | Freq: Once | ORAL | Status: AC
Start: 2015-12-08 — End: 2015-12-08
  Administered 2015-12-08: 1 via ORAL
  Filled 2015-12-08: qty 1

## 2015-12-08 MED ORDER — TRAMADOL HCL 50 MG PO TABS: 50.0000 mg | ORAL_TABLET | Freq: Four times a day (QID) | ORAL | Status: DC | PRN

## 2015-12-08 MED ORDER — CLONAZEPAM 0.5 MG PO TABS: 0.5000 mg | ORAL_TABLET | Freq: Three times a day (TID) | ORAL | Status: DC | PRN

## 2015-12-08 MED ORDER — LORAZEPAM 2 MG/ML IJ SOLN
1.0000 mg | Freq: Once | INTRAMUSCULAR | Status: AC
  Administered 2015-12-08: 1 mg via INTRAVENOUS
  Filled 2015-12-08: qty 1

## 2015-12-08 MED ORDER — HYDROMORPHONE HCL 1 MG/ML IJ SOLN
1.0000 mg | Freq: Once | INTRAMUSCULAR | Status: AC
  Administered 2015-12-08: 1 mg via INTRAVENOUS
  Filled 2015-12-08: qty 1

## 2015-12-08 MED ORDER — ONDANSETRON HCL 4 MG/2ML IJ SOLN
4.0000 mg | Freq: Once | INTRAMUSCULAR | Status: AC
  Administered 2015-12-08: 4 mg via INTRAVENOUS
  Filled 2015-12-08: qty 2

## 2015-12-08 MED ORDER — ONDANSETRON 4 MG PO TBDP
4.0000 mg | ORAL_TABLET | Freq: Once | ORAL | Status: AC
  Administered 2015-12-08: 4 mg via ORAL
  Filled 2015-12-08: qty 1

## 2015-12-08 MED ORDER — SODIUM CHLORIDE 0.9 % IV BOLUS (SEPSIS)
1000.0000 mL | Freq: Once | INTRAVENOUS | Status: AC
  Administered 2015-12-08: 1000 mL via INTRAVENOUS

## 2015-12-08 MED ORDER — ONDANSETRON 4 MG PO TBDP: ORAL_TABLET | ORAL | Status: DC

## 2015-12-08 NOTE — ED Provider Notes (Signed)
CSN: [id number]     Arrival date & time 12/08/15  1437 History   First MD Initiated Contact with Patient 12/08/15 1513     Chief Complaint  Patient presents with  . Chest Pain     (Consider location/radiation/quality/duration/timing/severity/associated sxs/prior Treatment) Patient is a 40 y.o. female presenting with abdominal pain. The history is provided by the patient (Patient complains of lower abdominal pain. No discharge).  Abdominal Pain Pain location:  Suprapubic Pain quality: aching   Pain radiates to:  Does not radiate Pain severity:  Moderate Onset quality:  Gradual Timing:  Intermittent Progression:  Waxing and waning Chronicity:  New Associated symptoms: no chest pain, no cough, no diarrhea, no fatigue and no hematuria     Past Medical History  Diagnosis Date  . Diabetes mellitus without complication (HCC)   . Hypertension   . Anxiety   . MI (myocardial infarction) (HCC)   . Chronic pelvic pain in female   . Chronic abdominal pain   . History of cardiac catheterization 10/2015    normal coronary arteries   Past Surgical History  Procedure Laterality Date  . Cesarean section    . Cardiac catheterization N/A 11/06/2015    Procedure: Left Heart Cath and Coronary Angiography;  Surgeon: Thurmon Fair, MD;  Location: [facility];  Service: Cardiovascular;  Laterality: N/A;   No family history on file. Social History  Substance Use Topics  . Smoking status: Current Every Day Smoker -- 0.50 packs/day for 5 years    Types: Cigarettes  . Smokeless tobacco: None  . Alcohol Use: No   OB History    Gravida Para Term Preterm AB TAB SAB Ectopic Multiple Living            2     Review of Systems  Constitutional: Negative for appetite change and fatigue.  HENT: Negative for congestion, ear discharge and sinus pressure.   Eyes: Negative for discharge.  Respiratory: Negative for cough.   Cardiovascular: Negative for chest pain.  Gastrointestinal: Positive for  abdominal pain. Negative for diarrhea.  Genitourinary: Negative for frequency and hematuria.  Musculoskeletal: Negative for back pain.  Skin: Negative for rash.  Neurological: Negative for seizures and headaches.  Psychiatric/Behavioral: Negative for hallucinations.      Allergies  Ciprofloxacin; Penicillins; Amoxicillin; Codeine; Metronidazole; Sulfa antibiotics; and Toradol  Home Medications   Prior to Admission medications   Medication Sig Start Date End Date Taking? Authorizing Provider  albuterol (PROAIR HFA) 108 (90 Base) MCG/ACT inhaler Inhale 2 puffs into the lungs every 6 (six) hours as needed for wheezing or shortness of breath. 11/19/15  Yes Sanjuana Kava, NP  gabapentin (NEURONTIN) 100 MG capsule Take 1 capsule (100 mg total) by mouth 3 (three) times daily. For agitation 11/19/15  Yes Sanjuana Kava, NP  metoprolol tartrate (LOPRESSOR) 25 MG tablet Take 1 tablet (25 mg total) by mouth 2 (two) times daily. For high blood pressure 12/03/15  Yes Marily Memos, MD  mirtazapine (REMERON) 30 MG tablet Take 1 tablet (30 mg total) by mouth at bedtime. For depression/sleep 11/19/15  Yes Sanjuana Kava, NP  acetaminophen (TYLENOL) 325 MG tablet Take 2 tablets (650 mg total) by mouth every 4 (four) hours as needed for headache or mild pain. Patient not taking: Reported on 12/07/2015 11/19/15   Sanjuana Kava, NP  clonazePAM (KLONOPIN) 0.5 MG tablet Take 1 tablet (0.5 mg total) by mouth 3 (three) times daily as needed for anxiety. 12/08/15   Jomarie Longs  Jannette Cotham, MD  nicotine (NICODERM CQ - DOSED IN MG/24 HOURS) 21 mg/24hr patch Place 1 patch (21 mg total) onto the skin daily. For smokin cessation Patient not taking: Reported on 12/07/2015 11/19/15   Sanjuana Kava, NP  ondansetron (ZOFRAN ODT) 4 MG disintegrating tablet 4mg  ODT q4 hours prn nausea/vomit 12/08/15   Bethann Berkshire, MD  ondansetron (ZOFRAN) 4 MG tablet Take 1 tablet (4 mg total) by mouth every 8 (eight) hours as needed for nausea or  vomiting. Patient not taking: Reported on 12/07/2015 12/03/15   Marily Memos, MD  traMADol (ULTRAM) 50 MG tablet Take 1 tablet (50 mg total) by mouth every 6 (six) hours as needed. 12/08/15   Bethann Berkshire, MD   BP 127/95 mmHg  Pulse 64  Temp(Src) 98.7 F (37.1 C) (Oral)  Resp 16  Ht 5\' 3"  (1.6 m)  Wt 170 lb (77.111 kg)  BMI 30.12 kg/m2  SpO2 98%  LMP 11/19/2015 Physical Exam  Constitutional: She is oriented to person, place, and time. She appears well-developed.  HENT:  Head: Normocephalic.  Eyes: Conjunctivae and EOM are normal. No scleral icterus.  Neck: Neck supple. No thyromegaly present.  Cardiovascular: Normal rate and regular rhythm.  Exam reveals no gallop and no friction rub.   No murmur heard. Pulmonary/Chest: No stridor. She has no wheezes. She has no rales. She exhibits no tenderness.  Abdominal: She exhibits no distension. There is tenderness. There is no rebound.  Mild tenderness to lower abdomen  Genitourinary:  Cervix minimally tender none discharge  Musculoskeletal: Normal range of motion. She exhibits no edema.  Lymphadenopathy:    She has no cervical adenopathy.  Neurological: She is oriented to person, place, and time. She exhibits normal muscle tone. Coordination normal.  Skin: No rash noted. No erythema.  Psychiatric: She has a normal mood and affect. Her behavior is normal.    ED Course  Procedures (including critical care time) Labs Review Labs Reviewed  CBC WITH DIFFERENTIAL/PLATELET - Abnormal; Notable for the following:    WBC 13.4 (*)    Neutro Abs 10.3 (*)    All other components within normal limits  COMPREHENSIVE METABOLIC PANEL  URINALYSIS, ROUTINE W REFLEX MICROSCOPIC (NOT AT [facility])  I-STAT BETA HCG BLOOD, ED ([facility], [facility], [facility] ONLY)  GC/CHLAMYDIA PROBE AMP ([place]) NOT AT [facility]    Imaging Review US Transvaginal Non-ob  12/08/2015  CLINICAL DATA:  Upper abdomen pain for 3 months. EXAM: TRANSABDOMINAL AND TRANSVAGINAL ULTRASOUND OF PELVIS  TECHNIQUE: Both transabdominal and transvaginal ultrasound examinations of the pelvis were performed. Transabdominal technique was performed for global imaging of the pelvis including uterus, ovaries, adnexal regions, and pelvic cul-de-sac. It was necessary to proceed with endovaginal exam following the transabdominal exam to visualize the ovaries and uterus. COMPARISON:  None FINDINGS: Uterus Measurements: 8.6 x 4.2 x 4.9 cm. No fibroids or other mass visualized. Endometrium Thickness: 2 mm.  No focal abnormality visualized. Right ovary Measurements: 3.7 x 3 x 2.8 Cm. There is a 2.8 cm septated cyst. Left ovary Measurements: 3.4 x 2.4 x 2.2 cm. Normal appearance/no adnexal mass. Other findings No abnormal free fluid. IMPRESSION: Minimal septated is in the right ovary likely follicular cyst. Exam is otherwise normal. Electronically Signed   By: Sherian Rein M.D.   On: 12/08/2015 18:07   US Pelvis Complete  12/08/2015  CLINICAL DATA:  Upper abdomen pain for 3 months. EXAM: TRANSABDOMINAL AND TRANSVAGINAL ULTRASOUND OF PELVIS TECHNIQUE: Both transabdominal and transvaginal ultrasound examinations of the pelvis  were performed. Transabdominal technique was performed for global imaging of the pelvis including uterus, ovaries, adnexal regions, and pelvic cul-de-sac. It was necessary to proceed with endovaginal exam following the transabdominal exam to visualize the ovaries and uterus. COMPARISON:  None FINDINGS: Uterus Measurements: 8.6 x 4.2 x 4.9 cm. No fibroids or other mass visualized. Endometrium Thickness: 2 mm.  No focal abnormality visualized. Right ovary Measurements: 3.7 x 3 x 2.8 Cm. There is a 2.8 cm septated cyst. Left ovary Measurements: 3.4 x 2.4 x 2.2 cm. Normal appearance/no adnexal mass. Other findings No abnormal free fluid. IMPRESSION: Minimal septated is in the right ovary likely follicular cyst. Exam is otherwise normal. Electronically Signed   By: Sherian Rein M.D.   On: 12/08/2015 18:07   US  Abdomen Limited Ruq  12/07/2015  CLINICAL DATA:  Right flank pain x2 days, vomiting EXAM: US ABDOMEN LIMITED - RIGHT UPPER QUADRANT COMPARISON:  CT abdomen pelvis dated 12/03/2015 FINDINGS: Gallbladder: In no gallstones, gallbladder wall thickening, or pericholecystic fluid. Negative sonographic Murphy's sign Common bile duct: Diameter: 5 mm Liver: No focal lesion identified. Within normal limits in parenchymal echogenicity. IMPRESSION: Negative right upper quadrant ultrasound. Electronically Signed   By: Charline Bills M.D.   On: 12/07/2015 14:02   I have personally reviewed and evaluated these images and lab results as part of my medical decision-making.   EKG Interpretation   Date/Time:  Tuesday Dec 08 2015 14:56:17 EDT Ventricular Rate:  83 PR Interval:  139 QRS Duration: 85 QT Interval:  370 QTC Calculation: 435 R Axis:   -63 Text Interpretation:  Sinus rhythm Left anterior fascicular block Baseline  wander in lead(s) II Confirmed by Britanni Yarde  MD, Wanisha Shiroma [phone]) on 12/08/2015  3:14:08 PM      MDM   Final diagnoses:  Abdominal pain in female    Patient with lower abdominal discomfort,    ultrasound of pelvic unremarkable.  Patient had GC chlamydia done. Patient is allergic to numerous antibiotics. We will wait to treat her with antibiotics until the cultures come back. She is to follow-up with GYN next week    Bethann Berkshire, MD 12/08/15 [phone]

## 2015-12-08 NOTE — ED Notes (Signed)
Per EMS, called out for tooth ache for two weeks. Pt states her chest and teeth hurt. Also, states she has been vomiting

## 2015-12-08 NOTE — Discharge Instructions (Signed)
Follow up with dr. eure next week. °

## 2015-12-09 LAB — GC/CHLAMYDIA PROBE AMP (~~LOC~~) NOT AT ARMC
Chlamydia: NEGATIVE
NEISSERIA GONORRHEA: NEGATIVE

## 2015-12-16 ENCOUNTER — Encounter: Payer: Self-pay | Admitting: Cardiology

## 2015-12-16 ENCOUNTER — Telehealth: Payer: Self-pay | Admitting: Cardiology

## 2015-12-16 NOTE — Telephone Encounter (Signed)
Called pt and left message asking pt to call back to update medical Hx and to see if pt has been seen by another provider or practice.

## 2015-12-18 ENCOUNTER — Encounter (HOSPITAL_COMMUNITY): Payer: Self-pay

## 2015-12-18 ENCOUNTER — Emergency Department (HOSPITAL_COMMUNITY): Payer: Medicaid Other

## 2015-12-18 ENCOUNTER — Emergency Department (HOSPITAL_COMMUNITY)
Admission: EM | Admit: 2015-12-18 | Discharge: 2015-12-18 | Disposition: A | Discharge status: Home / Self Care | Payer: Medicaid Other | Attending: Emergency Medicine | Admitting: Emergency Medicine

## 2015-12-18 DIAGNOSIS — K0889 Other specified disorders of teeth and supporting structures: Secondary | ICD-10-CM | POA: Insufficient documentation

## 2015-12-18 DIAGNOSIS — R110 Nausea: Secondary | ICD-10-CM | POA: Insufficient documentation

## 2015-12-18 DIAGNOSIS — Z79899 Other long term (current) drug therapy: Secondary | ICD-10-CM | POA: Diagnosis not present

## 2015-12-18 DIAGNOSIS — R109 Unspecified abdominal pain: Secondary | ICD-10-CM | POA: Diagnosis not present

## 2015-12-18 DIAGNOSIS — F419 Anxiety disorder, unspecified: Secondary | ICD-10-CM | POA: Diagnosis not present

## 2015-12-18 DIAGNOSIS — E119 Type 2 diabetes mellitus without complications: Secondary | ICD-10-CM | POA: Insufficient documentation

## 2015-12-18 DIAGNOSIS — I252 Old myocardial infarction: Secondary | ICD-10-CM | POA: Insufficient documentation

## 2015-12-18 DIAGNOSIS — F17210 Nicotine dependence, cigarettes, uncomplicated: Secondary | ICD-10-CM | POA: Diagnosis not present

## 2015-12-18 DIAGNOSIS — I10 Essential (primary) hypertension: Secondary | ICD-10-CM | POA: Diagnosis not present

## 2015-12-18 DIAGNOSIS — R079 Chest pain, unspecified: Secondary | ICD-10-CM | POA: Diagnosis not present

## 2015-12-18 DIAGNOSIS — G8929 Other chronic pain: Secondary | ICD-10-CM | POA: Insufficient documentation

## 2015-12-18 HISTORY — DX: Panic disorder (episodic paroxysmal anxiety): F41.0

## 2015-12-18 HISTORY — DX: Gastro-esophageal reflux disease without esophagitis: K21.9

## 2015-12-18 HISTORY — DX: Personal history of other medical treatment: Z92.89

## 2015-12-18 LAB — CBC WITH DIFFERENTIAL/PLATELET
BASOS ABS: 0.1 10*3/uL (ref 0.0–0.1)
BASOS PCT: 1 %
Eosinophils Absolute: 0.2 10*3/uL (ref 0.0–0.7)
Eosinophils Relative: 1 %
HEMATOCRIT: 46.4 % — AB (ref 36.0–46.0)
Hemoglobin: 15.1 g/dL — ABNORMAL HIGH (ref 12.0–15.0)
Lymphocytes Relative: 17 %
Lymphs Abs: 2 10*3/uL (ref 0.7–4.0)
MCH: 30.5 pg (ref 26.0–34.0)
MCHC: 32.5 g/dL (ref 30.0–36.0)
MCV: 93.7 fL (ref 78.0–100.0)
MONO ABS: 0.7 10*3/uL (ref 0.1–1.0)
Monocytes Relative: 6 %
NEUTROS ABS: 9 10*3/uL — AB (ref 1.7–7.7)
NEUTROS PCT: 75 %
Platelets: 255 10*3/uL (ref 150–400)
RBC: 4.95 MIL/uL (ref 3.87–5.11)
RDW: 14.2 % (ref 11.5–15.5)
WBC: 11.9 10*3/uL — ABNORMAL HIGH (ref 4.0–10.5)

## 2015-12-18 LAB — COMPREHENSIVE METABOLIC PANEL
ALBUMIN: 4.5 g/dL (ref 3.5–5.0)
ALK PHOS: 113 U/L (ref 38–126)
ALT: 18 U/L (ref 14–54)
ANION GAP: 6 (ref 5–15)
AST: 19 U/L (ref 15–41)
BUN: 6 mg/dL (ref 6–20)
CALCIUM: 9.3 mg/dL (ref 8.9–10.3)
CHLORIDE: 105 mmol/L (ref 101–111)
CO2: 27 mmol/L (ref 22–32)
Creatinine, Ser: 0.71 mg/dL (ref 0.44–1.00)
GFR calc Af Amer: >60 mL/min (ref >60)
GFR calc non Af Amer: >60 mL/min (ref >60)
GLUCOSE: 85 mg/dL (ref 65–99)
POTASSIUM: 4.2 mmol/L (ref 3.5–5.1)
SODIUM: 138 mmol/L (ref 135–145)
Total Bilirubin: 0.4 mg/dL (ref 0.3–1.2)
Total Protein: 7.4 g/dL (ref 6.5–8.1)

## 2015-12-18 LAB — URINALYSIS, ROUTINE W REFLEX MICROSCOPIC
Bilirubin Urine: NEGATIVE
Glucose, UA: NEGATIVE mg/dL
Hgb urine dipstick: NEGATIVE
KETONES UR: NEGATIVE mg/dL
NITRITE: NEGATIVE
PH: 5.5 (ref 5.0–8.0)
Protein, ur: NEGATIVE mg/dL

## 2015-12-18 LAB — TROPONIN I
Troponin I: 0.49 ng/mL — ABNORMAL HIGH (ref <0.031)
Troponin I: 0.47 ng/mL — ABNORMAL HIGH (ref <0.031)

## 2015-12-18 LAB — RAPID URINE DRUG SCREEN, HOSP PERFORMED
Amphetamines: NOT DETECTED
BARBITURATES: NOT DETECTED
BENZODIAZEPINES: POSITIVE — AB
COCAINE: NOT DETECTED
Opiates: NOT DETECTED
TETRAHYDROCANNABINOL: NOT DETECTED

## 2015-12-18 LAB — URINE MICROSCOPIC-ADD ON: RBC / HPF: NONE SEEN RBC/hpf (ref 0–5)

## 2015-12-18 LAB — LIPASE, BLOOD: Lipase: 15 U/L (ref 11–51)

## 2015-12-18 LAB — PREGNANCY, URINE: Preg Test, Ur: NEGATIVE

## 2015-12-18 MED ORDER — DICYCLOMINE HCL 20 MG PO TABS: 20.0000 mg | ORAL_TABLET | Freq: Four times a day (QID) | ORAL | Status: DC | PRN

## 2015-12-18 MED ORDER — HYDROXYZINE HCL 25 MG PO TABS
ORAL_TABLET | ORAL | Status: AC
  Filled 2015-12-18: qty 1

## 2015-12-18 MED ORDER — ACETAMINOPHEN 500 MG PO TABS
1000.0000 mg | ORAL_TABLET | Freq: Once | ORAL | Status: AC
  Administered 2015-12-18: 1000 mg via ORAL
  Filled 2015-12-18: qty 2

## 2015-12-18 MED ORDER — SODIUM CHLORIDE 0.9 % IV BOLUS (SEPSIS)
1000.0000 mL | Freq: Once | INTRAVENOUS | Status: AC
  Administered 2015-12-18: 1000 mL via INTRAVENOUS

## 2015-12-18 MED ORDER — METOCLOPRAMIDE HCL 5 MG/ML IJ SOLN
10.0000 mg | Freq: Once | INTRAMUSCULAR | Status: AC
  Administered 2015-12-18: 10 mg via INTRAVENOUS
  Filled 2015-12-18: qty 2

## 2015-12-18 MED ORDER — DIPHENHYDRAMINE HCL 50 MG/ML IJ SOLN
50.0000 mg | Freq: Once | INTRAMUSCULAR | Status: AC
  Administered 2015-12-18: 50 mg via INTRAVENOUS
  Filled 2015-12-18: qty 1

## 2015-12-18 MED ORDER — DICYCLOMINE HCL 10 MG/ML IM SOLN
20.0000 mg | Freq: Once | INTRAMUSCULAR | Status: AC
  Administered 2015-12-18: 20 mg via INTRAMUSCULAR
  Filled 2015-12-18: qty 2

## 2015-12-18 MED ORDER — HYDROXYZINE HCL 25 MG PO TABS: 25.0000 mg | ORAL_TABLET | Freq: Four times a day (QID) | ORAL | Status: DC | PRN

## 2015-12-18 MED ORDER — HYDROXYZINE HCL 25 MG PO TABS
25.0000 mg | ORAL_TABLET | Freq: Once | ORAL | Status: AC
  Administered 2015-12-18: 25 mg via ORAL

## 2015-12-18 NOTE — ED Notes (Signed)
Patient ambulatory to restroom. No deficits noted

## 2015-12-18 NOTE — ED Notes (Signed)
Pt complain of nausea, abdominal pain and passing blood that started yesterday. Complain of left sided chest pain that started around 0700 this morning. Pt was given Zofran 4 mg IV by EMS prior to arrival

## 2015-12-18 NOTE — ED Provider Notes (Signed)
CSN: [id number]     Arrival date & time 12/18/15  1446 History   First MD Initiated Contact with Patient 12/18/15 1539     Chief Complaint  Patient presents with  . Chest Pain  . Abdominal Pain      HPI Pt was seen at 1540. Per pt, c/o gradual onset and persistence of constant multiple symptoms for the past several days. Symptoms include: acute flair of her chronic "teeth pain," nausea, acute flair of her chronic abd pain, "passing blood" by rectum, and acute flair of her chronic central and right sided chest "pain" that began at 0700 this morning. Pt states her rectal bleeding started after she passed a large/hard BM. Pt also requesting "something for my nerves as well as my pain." The symptoms have been associated with no other complaints. The patient has a significant history of similar symptoms previously, recently being evaluated for this complaint and multiple prior evals for same.  Pt has been evaluated in the ED 11 times in the past 6 weeks for these same complaints, as well as admitted x1 with normal cardiac cath and echocardiogram. Pt has had 2 reassuring CT A/P studies, as well as 2 [place] abd/pelvis studies within this time frame.    Past Medical History  Diagnosis Date  . Diabetes mellitus without complication (HCC)   . Hypertension   . Anxiety   . MI (myocardial infarction) (HCC) 10/2015    elevated troponin, likely due to vasospasm, clean coronary arteries on cath  . Chronic pelvic pain in female   . Chronic abdominal pain   . History of cardiac catheterization 11/06/2015    normal coronary arteries  . History of echocardiogram 11/06/2015    normal   Past Surgical History  Procedure Laterality Date  . Cesarean section    . Cardiac catheterization N/A 11/06/2015    Procedure: Left Heart Cath and Coronary Angiography;  Surgeon: Thurmon Fair, MD;  Location: [facility];  Service: Cardiovascular;  Laterality: N/A;    Social History  Substance Use Topics  . Smoking  status: Current Every Day Smoker -- 0.50 packs/day for 5 years    Types: Cigarettes  . Smokeless tobacco: None  . Alcohol Use: No    Review of Systems ROS: Statement: All systems negative except as marked or noted in the HPI; Constitutional: Negative for fever and chills. ; ; Eyes: Negative for eye pain, redness and discharge. ; ; ENMT: +chronic teeth pain. Negative for ear pain, hoarseness, nasal congestion, sinus pressure and sore throat. ; ; Cardiovascular: +CP. Negative for palpitations, diaphoresis, dyspnea and peripheral edema. ; ; Respiratory: Negative for cough, wheezing and stridor. ; ; Gastrointestinal: +abd pain, nausea. Negative for vomiting, diarrhea, blood in stool, hematemesis, jaundice and rectal bleeding. . ; ; Genitourinary: Negative for dysuria, flank pain and hematuria. ; ; Musculoskeletal: Negative for back pain and neck pain. Negative for swelling and trauma.; ; Skin: Negative for pruritus, rash, abrasions, blisters, bruising and skin lesion.; ; Neuro: Negative for headache, lightheadedness and neck stiffness. Negative for weakness, altered level of consciousness, altered mental status, extremity weakness, paresthesias, involuntary movement, seizure and syncope.      Allergies  Ciprofloxacin; Penicillins; Amoxicillin; Codeine; Metronidazole; Sulfa antibiotics; and Toradol  Home Medications   Prior to Admission medications   Medication Sig Start Date End Date Taking? Authorizing Provider  albuterol (PROAIR HFA) 108 (90 Base) MCG/ACT inhaler Inhale 2 puffs into the lungs every 6 (six) hours as needed for wheezing or shortness  of breath. 11/19/15  Yes Sanjuana Kava, NP  beclomethasone (QVAR) 80 MCG/ACT inhaler Inhale 2 puffs into the lungs 2 (two) times daily.   Yes Historical Provider, MD  clindamycin (CLEOCIN) 150 MG capsule Take 150 mg by mouth 2 (two) times daily.   Yes Historical Provider, MD  clonazePAM (KLONOPIN) 0.5 MG tablet Take 1 tablet (0.5 mg total) by mouth 3  (three) times daily as needed for anxiety. 12/08/15  Yes Bethann Berkshire, MD  gabapentin (NEURONTIN) 100 MG capsule Take 1 capsule (100 mg total) by mouth 3 (three) times daily. For agitation 11/19/15  Yes Sanjuana Kava, NP  LORazepam (ATIVAN) 1 MG tablet Take 1 mg by mouth every 8 (eight) hours as needed for anxiety.   Yes Historical Provider, MD  metoprolol tartrate (LOPRESSOR) 25 MG tablet Take 1 tablet (25 mg total) by mouth 2 (two) times daily. For high blood pressure 12/03/15  Yes Marily Memos, MD  mirtazapine (REMERON) 30 MG tablet Take 1 tablet (30 mg total) by mouth at bedtime. For depression/sleep 11/19/15  Yes Sanjuana Kava, NP  omeprazole (PRILOSEC OTC) 20 MG tablet Take 20 mg by mouth daily.   Yes Historical Provider, MD  acetaminophen (TYLENOL) 325 MG tablet Take 2 tablets (650 mg total) by mouth every 4 (four) hours as needed for headache or mild pain. Patient not taking: Reported on 12/07/2015 11/19/15   Sanjuana Kava, NP  nicotine (NICODERM CQ - DOSED IN MG/24 HOURS) 21 mg/24hr patch Place 1 patch (21 mg total) onto the skin daily. For smokin cessation Patient not taking: Reported on 12/07/2015 11/19/15   Sanjuana Kava, NP  ondansetron (ZOFRAN ODT) 4 MG disintegrating tablet 4mg  ODT q4 hours prn nausea/vomit Patient not taking: Reported on 12/18/2015 12/08/15   Bethann Berkshire, MD  ondansetron (ZOFRAN) 4 MG tablet Take 1 tablet (4 mg total) by mouth every 8 (eight) hours as needed for nausea or vomiting. Patient not taking: Reported on 12/07/2015 12/03/15   Marily Memos, MD  traMADol (ULTRAM) 50 MG tablet Take 1 tablet (50 mg total) by mouth every 6 (six) hours as needed. Patient not taking: Reported on 12/18/2015 12/08/15   Bethann Berkshire, MD   BP 137/90 mmHg  Pulse 104  Temp(Src) 98.5 F (36.9 C) (Oral)  Resp 21  Ht 5\' 3"  (1.6 m)  Wt 170 lb (77.111 kg)  BMI 30.12 kg/m2  SpO2 100%  LMP 11/19/2015 Physical Exam 1545: Physical examination: Vital signs and O2 SAT: Reviewed; Constitutional:  Well developed, Well nourished, Well hydrated, In no acute distress; Head and Face: Normocephalic, Atraumatic; Eyes: EOMI, PERRL, No scleral icterus; ENMT: Mouth and pharynx normal, Poor dentition, Widespread dental decay, Left TM normal, Right TM normal, Mucous membranes moist,  No gingival erythema, edema, fluctuance, or drainage.  No intra-oral edema. No submandibular or sublingual edema. No hoarse voice, no drooling, no stridor. No trismus. ;  Neck: Supple, Full range of motion, No lymphadenopathy; Cardiovascular: Regular rate and rhythm, No murmur, rub, or gallop; Respiratory: Breath sounds clear & equal bilaterally, No rales, rhonchi, wheezes.  Speaking full sentences with ease, Normal respiratory effort/excursion; Chest: Nontender, Movement normal; Abdomen: Soft, Nontender, Nondistended, Normal bowel sounds. Rectal exam performed w/permission of pt and ED RN chaperone present.  Anal tone normal.  Non-tender, soft brown stool in rectal vault, heme neg.  +tender anal fissure without active bleeding. +external hemorrhoids without thrombosis or bleeding. No palp masses.; Genitourinary: No CVA tenderness; Extremities: Pulses normal, No tenderness, No edema, No calf  edema or asymmetry.; Neuro: AA&Ox3, Major CN grossly intact.  Speech clear. No gross focal motor or sensory deficits in extremities. Climbs on and off stretcher easily by herself. Gait steady.; Skin: Color normal, Warm, Dry.; Psych:  Anxious.     ED Course  Procedures (including critical care time) Labs Review   Imaging Review  I have personally reviewed and evaluated these images and lab results as part of my medical decision-making.   EKG Interpretation   Date/Time:  Friday Dec 18 2015 15:05:18 EDT Ventricular Rate:  88 PR Interval:  143 QRS Duration: 84 QT Interval:  392 QTC Calculation: 474 R Axis:   -52 Text Interpretation:  Sinus rhythm Left axis deviation Left anterior  fascicular block Nonspecific ST and T wave  abnormality Lateral leads  Baseline wander When compared with ECG of 12/08/2015 Nonspecific ST and T  wave abnormality is now Present Confirmed by [facility]  MD, Nicholos Johns [phone])  on 12/18/2015 4:26:27 PM      MDM  MDM Reviewed: previous chart, nursing note and vitals Reviewed previous: labs, ECG, ultrasound, CT scan and x-ray Interpretation: labs, ECG and x-ray   Results for orders placed or performed during the hospital encounter of 12/18/15  CBC with Differential  Result Value Ref Range   WBC 11.9 (H) 4.0 - 10.5 K/uL   RBC 4.95 3.87 - 5.11 MIL/uL   Hemoglobin 15.1 (H) 12.0 - 15.0 g/dL   HCT 60.4 (H) 54.0 - 98.1 %   MCV 93.7 78.0 - 100.0 fL   MCH 30.5 26.0 - 34.0 pg   MCHC 32.5 30.0 - 36.0 g/dL   RDW 19.1 47.8 - 29.5 %   Platelets 255 150 - 400 K/uL   Neutrophils Relative % 75 %   Neutro Abs 9.0 (H) 1.7 - 7.7 K/uL   Lymphocytes Relative 17 %   Lymphs Abs 2.0 0.7 - 4.0 K/uL   Monocytes Relative 6 %   Monocytes Absolute 0.7 0.1 - 1.0 K/uL   Eosinophils Relative 1 %   Eosinophils Absolute 0.2 0.0 - 0.7 K/uL   Basophils Relative 1 %   Basophils Absolute 0.1 0.0 - 0.1 K/uL  Troponin I  Result Value Ref Range   Troponin I 0.49 (H) <0.031 ng/mL  Comprehensive metabolic panel  Result Value Ref Range   Sodium 138 135 - 145 mmol/L   Potassium 4.2 3.5 - 5.1 mmol/L   Chloride 105 101 - 111 mmol/L   CO2 27 22 - 32 mmol/L   Glucose, Bld 85 65 - 99 mg/dL   BUN 6 6 - 20 mg/dL   Creatinine, Ser 6.21 0.44 - 1.00 mg/dL   Calcium 9.3 8.9 - 30.8 mg/dL   Total Protein 7.4 6.5 - 8.1 g/dL   Albumin 4.5 3.5 - 5.0 g/dL   AST 19 15 - 41 U/L   ALT 18 14 - 54 U/L   Alkaline Phosphatase 113 38 - 126 U/L   Total Bilirubin 0.4 0.3 - 1.2 mg/dL   GFR calc non Af Amer >60 >60 mL/min   GFR calc Af Amer >60 >60 mL/min   Anion gap 6 5 - 15  Lipase, blood  Result Value Ref Range   Lipase 15 11 - 51 U/L  Troponin I  Result Value Ref Range   Troponin I 0.47 (H) <0.031 ng/mL  Urinalysis, Routine  w reflex microscopic  Result Value Ref Range   Color, Urine YELLOW YELLOW   APPearance CLEAR CLEAR   Specific Gravity, Urine <1.005 (L)  1.005 - 1.030   pH 5.5 5.0 - 8.0   Glucose, UA NEGATIVE NEGATIVE mg/dL   Hgb urine dipstick NEGATIVE NEGATIVE   Bilirubin Urine NEGATIVE NEGATIVE   Ketones, ur NEGATIVE NEGATIVE mg/dL   Protein, ur NEGATIVE NEGATIVE mg/dL   Nitrite NEGATIVE NEGATIVE   Leukocytes, UA TRACE (A) NEGATIVE  Urine rapid drug screen (hosp performed)  Result Value Ref Range   Opiates NONE DETECTED NONE DETECTED   Cocaine NONE DETECTED NONE DETECTED   Benzodiazepines POSITIVE (A) NONE DETECTED   Amphetamines NONE DETECTED NONE DETECTED   Tetrahydrocannabinol NONE DETECTED NONE DETECTED   Barbiturates NONE DETECTED NONE DETECTED  Pregnancy, urine  Result Value Ref Range   Preg Test, Ur NEGATIVE NEGATIVE  Urine microscopic-add on  Result Value Ref Range   Squamous Epithelial / LPF 6-30 (A) NONE SEEN   WBC, UA 0-5 0 - 5 WBC/hpf   RBC / HPF NONE SEEN 0 - 5 RBC/hpf   Bacteria, UA FEW (A) NONE SEEN    Dg Chest Portable 1 View 12/18/2015  CLINICAL DATA:  Left-sided chest pain, shortness of breath EXAM: PORTABLE CHEST 1 VIEW COMPARISON:  12/05/2015 FINDINGS: The heart size and mediastinal contours are within normal limits. Both lungs are clear. The visualized skeletal structures are unremarkable. IMPRESSION: No active disease. Electronically Signed   By: Elige Ko   On: 12/18/2015 15:20   Dg Abd 2 Views 12/18/2015  CLINICAL DATA:  Acute onset of nausea and generalized abdominal pain. Hematochezia. Left-sided chest pain. Initial encounter. EXAM: ABDOMEN - 2 VIEW COMPARISON:  CT of the abdomen and pelvis performed 12/03/2015 FINDINGS: The visualized bowel gas pattern is unremarkable. Scattered air and stool filled loops of colon are seen; no abnormal dilatation of small bowel loops is seen to suggest small bowel obstruction. No free intra-abdominal air is identified, though  evaluation for free air is limited on a single supine view. The visualized osseous structures are within normal limits; the sacroiliac joints are unremarkable in appearance. The visualized lung bases are essentially clear. IMPRESSION: Unremarkable bowel gas pattern; no free intra-abdominal air seen. Small to moderate amount of stool noted in the colon. Electronically Signed   By: Roanna Raider M.D.   On: 12/18/2015 19:56       1600:  [place] Controlled Substance Database accessed: in the past 5 weeks, pt has received 4 different benzo rx, written by 4 different providers and filled at 4 different pharmacies. The last rx, dated 5/16 and filled 5/17, was for #20 and has "refill #1" next to it; EPIC chart reviewed, this rx was not written with refills. I informed pt that she would not be receiving narcotics or benzos today; pt then stated to me "oh I mean, I wasn't talking about getting a rx, if you could just give me some here." Concern regarding drug seeking behavior.   1630:  Troponin elevated, but near baseline previously elevated troponins.  T/C to [facility] Dr. Diona Browner, case discussed, including:  HPI, pertinent PM/SHx, VS/PE, dx testing, ED course and treatment:  He has viewed the EKG (agrees NS STTW changes lateral leads), and that pt's troponin is not significantly elevated and within her baseline troponin leaks, in the setting of normal cardiac cath and echocardiogram 1 month ago there is no other interventional workup that needs to be performed; can obtain 2nd troponin for trend, if same or lower can be d/c to f/u in office.  1730:  Pt continues to ask for benzos and narcotics since  her arrival to the ED. Will continue to tx chronic pain/anxiety.   2000:  Pt has been ambulatory with steady gait, easy resps, NAD. Chronically elevated troponin trending downward. VS remain stable. Pt has tol PO well while in the ED without N/V.  Pt encouraged to f/u with her PMD, Cards MD and Mental Health provider for  good continuity of care and control of her chronic medical conditions.  Pt verb understanding. Dx and testing d/w pt.  Questions answered.  Verb understanding, agreeable to d/c home with outpt f/u.    Samuel Jester, DO 12/21/15 1416

## 2015-12-18 NOTE — Discharge Instructions (Signed)
Eat a bland diet, avoiding greasy, fatty, fried foods, as well as spicy and acidic foods or beverages.  Avoid eating within the hour or 2 before going to bed or laying down.  Also avoid teas, colas, coffee, chocolate, pepermint and spearment.  Take over the counter pepcid, one tablet by mouth twice a day, for the next 3 to 4 weeks.  May also take over the counter maalox/mylanta, as directed on packaging, as needed for discomfort.  Take the prescriptions as directed.  Call your regular medical doctor, the GI doctor, the Cardiologist, your Dentist and your mental health provider on Monday to schedule a follow up appointment within the next week.  Return to the Emergency Department immediately if worsening.

## 2015-12-18 NOTE — ED Notes (Signed)
Patient ambulatory to and from restroom.

## 2015-12-18 NOTE — ED Notes (Signed)
Patient states chest pain that started this morning. Patient states pain is worse with deep breathing.

## 2015-12-18 NOTE — ED Notes (Signed)
Patient verbalizes understanding of discharge instructions, prescriptions, home care and follow up care. Patient out of department at this time. 

## 2015-12-18 NOTE — ED Notes (Signed)
Pt had a negative pregnancy test

## 2015-12-18 NOTE — ED Notes (Signed)
Patient ambulatory to restroom  ?

## 2015-12-19 ENCOUNTER — Encounter (HOSPITAL_COMMUNITY): Payer: Self-pay | Admitting: Emergency Medicine

## 2015-12-19 ENCOUNTER — Emergency Department (HOSPITAL_COMMUNITY)
Admission: EM | Admit: 2015-12-19 | Discharge: 2015-12-19 | Disposition: A | Discharge status: Home / Self Care | Payer: Medicaid Other | Attending: Emergency Medicine | Admitting: Emergency Medicine

## 2015-12-19 DIAGNOSIS — I10 Essential (primary) hypertension: Secondary | ICD-10-CM | POA: Diagnosis not present

## 2015-12-19 DIAGNOSIS — F419 Anxiety disorder, unspecified: Secondary | ICD-10-CM | POA: Diagnosis not present

## 2015-12-19 DIAGNOSIS — R0789 Other chest pain: Secondary | ICD-10-CM | POA: Insufficient documentation

## 2015-12-19 DIAGNOSIS — F17210 Nicotine dependence, cigarettes, uncomplicated: Secondary | ICD-10-CM | POA: Insufficient documentation

## 2015-12-19 DIAGNOSIS — G8929 Other chronic pain: Secondary | ICD-10-CM | POA: Diagnosis not present

## 2015-12-19 DIAGNOSIS — E119 Type 2 diabetes mellitus without complications: Secondary | ICD-10-CM | POA: Diagnosis not present

## 2015-12-19 DIAGNOSIS — K0889 Other specified disorders of teeth and supporting structures: Secondary | ICD-10-CM

## 2015-12-19 DIAGNOSIS — I252 Old myocardial infarction: Secondary | ICD-10-CM | POA: Insufficient documentation

## 2015-12-19 DIAGNOSIS — Z79899 Other long term (current) drug therapy: Secondary | ICD-10-CM | POA: Insufficient documentation

## 2015-12-19 MED ORDER — TRAMADOL HCL 50 MG PO TABS
50.0000 mg | ORAL_TABLET | Freq: Once | ORAL | Status: AC
  Administered 2015-12-19: 50 mg via ORAL
  Filled 2015-12-19: qty 1

## 2015-12-19 MED ORDER — TRAMADOL HCL 50 MG PO TABS: 50.0000 mg | ORAL_TABLET | Freq: Four times a day (QID) | ORAL | Status: DC | PRN

## 2015-12-19 MED ORDER — LORAZEPAM 1 MG PO TABS
1.0000 mg | ORAL_TABLET | Freq: Once | ORAL | Status: AC
  Administered 2015-12-19: 1 mg via ORAL
  Filled 2015-12-19: qty 1

## 2015-12-19 MED ORDER — CLONAZEPAM 0.5 MG PO TABS: 0.5000 mg | ORAL_TABLET | Freq: Three times a day (TID) | ORAL | Status: DC | PRN

## 2015-12-19 NOTE — ED Notes (Signed)
Patient with no complaints at this time. Respirations even and unlabored. Skin warm/dry. Discharge instructions reviewed with patient at this time. Patient given opportunity to voice concerns/ask questions. Patient discharged at this time and left Emergency Department with steady gait.   

## 2015-12-19 NOTE — ED Notes (Signed)
Pt c/o left upper quadrant pain/cp and was seen yesterday in ED pt was given hydroxyzine and bentyl and did not get prescription filled.  Pt has had this pain since yesterday morning. Pt reports that she had bright red blood in stool.  Vitals wnl en route.  Pt had 4mg  zofran IM in right deltoid by EMS.

## 2015-12-19 NOTE — ED Provider Notes (Signed)
CSN: [id number]     Arrival date & time 12/19/15  0930 History  By signing my name below, I, Essence Howell and Renetta Chalk, attest that this documentation has been prepared under the direction and in the presence of Bethann Berkshire, MD Electronically Signed: Charline Bills, ED Scribe 12/19/2015 at 10:17 AM.    Chief Complaint  Patient presents with  . Chest Pain   Patient is a 40 y.o. female presenting with tooth pain. The history is provided by the patient (Patient having some chest discomfort along with dental pain.). No language interpreter was used.  Dental Pain Location:  Generalized Quality:  Constant Severity:  Severe Onset quality:  Gradual Timing:  Constant Progression:  Worsening Chronicity:  Recurrent Context: poor dentition   Relieved by:  Nothing Worsened by:  Nothing tried Ineffective treatments:  None tried Associated symptoms: no congestion and no headaches     HPI Comments: Melissa Butler is a 40 y.o. female who presents to the Emergency Department with complaining of chronic dental pain. Pt has associated symptoms of increased anxiety with chest tightness. Pt took one Klonopin this morning with no anxiety relief. Pt is Rx Klonopin TID by Psychiatrist but ran out this morning. Pt also reports she can not eat or sleep due to pain. Pt was admitted to the ED yesterday for similar symptoms given hydroxyzine and bentyl but did not get Rx filled. Pt has no PCP.    Past Medical History  Diagnosis Date  . Diabetes mellitus without complication (HCC)   . Hypertension   . Anxiety   . MI (myocardial infarction) (HCC) 10/2015    elevated troponin, likely due to vasospasm, clean coronary arteries on cath  . Chronic pelvic pain in female   . Chronic abdominal pain   . History of cardiac catheterization 11/06/2015    normal coronary arteries  . History of echocardiogram 11/06/2015    normal  . GERD (gastroesophageal reflux disease)   . Panic attack    Past Surgical History   Procedure Laterality Date  . Cesarean section    . Cardiac catheterization N/A 11/06/2015    Procedure: Left Heart Cath and Coronary Angiography;  Surgeon: Thurmon Fair, MD;  Location: [facility];  Service: Cardiovascular;  Laterality: N/A;   History reviewed. No pertinent family history. Social History  Substance Use Topics  . Smoking status: Current Every Day Smoker -- 0.50 packs/day for 5 years    Types: Cigarettes  . Smokeless tobacco: None  . Alcohol Use: No   OB History    Gravida Para Term Preterm AB TAB SAB Ectopic Multiple Living            2     Review of Systems  Constitutional: Negative for appetite change and fatigue.  HENT: Positive for dental problem (Generalized pain). Negative for congestion, ear discharge and sinus pressure.   Eyes: Negative for discharge.  Respiratory: Positive for chest tightness. Negative for cough.   Cardiovascular: Negative for chest pain.  Gastrointestinal: Negative for abdominal pain and diarrhea.  Genitourinary: Negative for frequency and hematuria.  Musculoskeletal: Negative for back pain.  Skin: Negative for rash.  Neurological: Negative for seizures and headaches.  Psychiatric/Behavioral: Negative for hallucinations. The patient is nervous/anxious.   All other systems reviewed and are negative.  Allergies  Ciprofloxacin; Penicillins; Amoxicillin; Codeine; Metronidazole; Sulfa antibiotics; and Toradol  Home Medications   Prior to Admission medications   Medication Sig Start Date End Date Taking? Authorizing Provider  acetaminophen (TYLENOL) 325  MG tablet Take 2 tablets (650 mg total) by mouth every 4 (four) hours as needed for headache or mild pain. Patient not taking: Reported on 12/07/2015 11/19/15   Sanjuana Kava, NP  albuterol [facility] HFA) 108 [phone] Base) MCG/ACT inhaler Inhale 2 puffs into the lungs every 6 (six) hours as needed for wheezing or shortness of breath. 11/19/15   Sanjuana Kava, NP  beclomethasone (QVAR) 80  MCG/ACT inhaler Inhale 2 puffs into the lungs 2 (two) times daily.    Historical Provider, MD  clindamycin (CLEOCIN) 150 MG capsule Take 150 mg by mouth 2 (two) times daily.    Historical Provider, MD  clonazePAM (KLONOPIN) 0.5 MG tablet Take 1 tablet (0.5 mg total) by mouth 3 (three) times daily as needed for anxiety. 12/08/15   Bethann Berkshire, MD  dicyclomine (BENTYL) 20 MG tablet Take 1 tablet (20 mg total) by mouth every 6 (six) hours as needed for spasms (abdominal cramping). 12/18/15   Samuel Jester, DO  gabapentin (NEURONTIN) 100 MG capsule Take 1 capsule (100 mg total) by mouth 3 (three) times daily. For agitation 11/19/15   Sanjuana Kava, NP  hydrOXYzine (ATARAX/VISTARIL) 25 MG tablet Take 1 tablet (25 mg total) by mouth every 6 (six) hours as needed for anxiety, nausea or vomiting. 12/18/15   Samuel Jester, DO  LORazepam (ATIVAN) 1 MG tablet Take 1 mg by mouth every 8 (eight) hours as needed for anxiety.    Historical Provider, MD  metoprolol tartrate (LOPRESSOR) 25 MG tablet Take 1 tablet (25 mg total) by mouth 2 (two) times daily. For high blood pressure 12/03/15   Marily Memos, MD  mirtazapine (REMERON) 30 MG tablet Take 1 tablet (30 mg total) by mouth at bedtime. For depression/sleep 11/19/15   Sanjuana Kava, NP  nicotine (NICODERM CQ - DOSED IN MG/24 HOURS) 21 mg/24hr patch Place 1 patch (21 mg total) onto the skin daily. For smokin cessation Patient not taking: Reported on 12/07/2015 11/19/15   Sanjuana Kava, NP  omeprazole (PRILOSEC OTC) 20 MG tablet Take 20 mg by mouth daily.    Historical Provider, MD  ondansetron (ZOFRAN ODT) 4 MG disintegrating tablet 4mg  ODT q4 hours prn nausea/vomit Patient not taking: Reported on 12/18/2015 12/08/15   Bethann Berkshire, MD  ondansetron (ZOFRAN) 4 MG tablet Take 1 tablet (4 mg total) by mouth every 8 (eight) hours as needed for nausea or vomiting. Patient not taking: Reported on 12/07/2015 12/03/15   Marily Memos, MD  traMADol (ULTRAM) 50 MG tablet Take 1  tablet (50 mg total) by mouth every 6 (six) hours as needed. Patient not taking: Reported on 12/18/2015 12/08/15   Bethann Berkshire, MD   Triage Vitals: BP 153/98 mmHg  Pulse 97  Temp(Src) 98 F (36.7 C) (Oral)  Resp 18  Ht 5\' 3"  (1.6 m)  Wt 170 lb (77.111 kg)  BMI 30.12 kg/m2  SpO2 100%  LMP 11/19/2015 Physical Exam  Constitutional: She is oriented to person, place, and time. She appears well-developed.  HENT:  Head: Normocephalic.  Poor Dentition  Eyes: Conjunctivae and EOM are normal. No scleral icterus.  Neck: Neck supple. No tracheal deviation present. No thyromegaly present.  Cardiovascular: Normal rate and regular rhythm.  Exam reveals no gallop and no friction rub.   No murmur heard. Pulmonary/Chest: No stridor. She has no wheezes. She has no rales. She exhibits no tenderness.  Abdominal: She exhibits no distension. There is no tenderness. There is no rebound.  Musculoskeletal: Normal range of  motion. She exhibits no edema.  Lymphadenopathy:    She has no cervical adenopathy.  Neurological: She is oriented to person, place, and time. She exhibits normal muscle tone. Coordination normal.  Skin: Skin is warm. No rash noted. No erythema.  Psychiatric: Her mood appears anxious.  Anxiety    ED Course  Procedures  DIAGNOSTIC STUDIES: Oxygen Saturation is 100% on RA, normal by my interpretation.    COORDINATION OF CARE: 10:06 AM-Discussed treatment plan which includes Klonopin and pain medication with pt at bedside and pt agreed to plan.   Labs Review Labs Reviewed - No data to display  Imaging Review Dg Chest Portable 1 View  12/18/2015  CLINICAL DATA:  Left-sided chest pain, shortness of breath EXAM: PORTABLE CHEST 1 VIEW COMPARISON:  12/05/2015 FINDINGS: The heart size and mediastinal contours are within normal limits. Both lungs are clear. The visualized skeletal structures are unremarkable. IMPRESSION: No active disease. Electronically Signed   By: Elige Ko   On:  12/18/2015 15:20   Dg Abd 2 Views  12/18/2015  CLINICAL DATA:  Acute onset of nausea and generalized abdominal pain. Hematochezia. Left-sided chest pain. Initial encounter. EXAM: ABDOMEN - 2 VIEW COMPARISON:  CT of the abdomen and pelvis performed 12/03/2015 FINDINGS: The visualized bowel gas pattern is unremarkable. Scattered air and stool filled loops of colon are seen; no abnormal dilatation of small bowel loops is seen to suggest small bowel obstruction. No free intra-abdominal air is identified, though evaluation for free air is limited on a single supine view. The visualized osseous structures are within normal limits; the sacroiliac joints are unremarkable in appearance. The visualized lung bases are essentially clear. IMPRESSION: Unremarkable bowel gas pattern; no free intra-abdominal air seen. Small to moderate amount of stool noted in the colon. Electronically Signed   By: Roanna Raider M.D.   On: 12/18/2015 19:56   I have personally reviewed and evaluated these images and lab results as part of my medical decision-making.   EKG Interpretation None      MDM   Final diagnoses:  None    Patient was evaluated thoroughly for her chest pain yesterday and it was decided no further workup was necessary after consult and cardiology. She had a clean catheterization 1 month ago. Patient here for toothache and chronic anxiety. [facility]. She missed her last appointment and has recently run out of her anxiety medicine. She will be given 5 days worth of her Klonopin. And she is given some Ultram for her toothache. She is supposed to follow-up with a dentist to have that taken care     The chart was scribed for me under my direct supervision.  I personally performed the history, physical, and medical decision making and all procedures in the evaluation of this patient.Bethann Berkshire, MD 12/19/15 1040

## 2015-12-19 NOTE — Discharge Instructions (Signed)
Follow up with [facility] next week to continue your anxiety treatment.  Follow up with a dentist as planned

## 2015-12-21 ENCOUNTER — Encounter (HOSPITAL_COMMUNITY): Payer: Self-pay | Admitting: Emergency Medicine

## 2015-12-21 ENCOUNTER — Emergency Department (HOSPITAL_COMMUNITY): Payer: Medicaid Other

## 2015-12-21 ENCOUNTER — Emergency Department (HOSPITAL_COMMUNITY)
Admission: EM | Admit: 2015-12-21 | Discharge: 2015-12-21 | Disposition: A | Discharge status: Home / Self Care | Payer: Medicaid Other | Attending: Emergency Medicine | Admitting: Emergency Medicine

## 2015-12-21 DIAGNOSIS — I10 Essential (primary) hypertension: Secondary | ICD-10-CM | POA: Diagnosis not present

## 2015-12-21 DIAGNOSIS — J40 Bronchitis, not specified as acute or chronic: Secondary | ICD-10-CM

## 2015-12-21 DIAGNOSIS — F17210 Nicotine dependence, cigarettes, uncomplicated: Secondary | ICD-10-CM | POA: Insufficient documentation

## 2015-12-21 DIAGNOSIS — R072 Precordial pain: Secondary | ICD-10-CM | POA: Diagnosis present

## 2015-12-21 DIAGNOSIS — I252 Old myocardial infarction: Secondary | ICD-10-CM | POA: Insufficient documentation

## 2015-12-21 DIAGNOSIS — Z79899 Other long term (current) drug therapy: Secondary | ICD-10-CM | POA: Diagnosis not present

## 2015-12-21 DIAGNOSIS — R079 Chest pain, unspecified: Secondary | ICD-10-CM

## 2015-12-21 DIAGNOSIS — E119 Type 2 diabetes mellitus without complications: Secondary | ICD-10-CM | POA: Diagnosis not present

## 2015-12-21 LAB — COMPREHENSIVE METABOLIC PANEL
ALT: 17 U/L (ref 14–54)
ANION GAP: 7 (ref 5–15)
AST: 19 U/L (ref 15–41)
Albumin: 4.7 g/dL (ref 3.5–5.0)
Alkaline Phosphatase: 110 U/L (ref 38–126)
BILIRUBIN TOTAL: 0.4 mg/dL (ref 0.3–1.2)
BUN: 8 mg/dL (ref 6–20)
CO2: 25 mmol/L (ref 22–32)
Calcium: 9.3 mg/dL (ref 8.9–10.3)
Chloride: 105 mmol/L (ref 101–111)
Creatinine, Ser: 0.8 mg/dL (ref 0.44–1.00)
Glucose, Bld: 112 mg/dL — ABNORMAL HIGH (ref 65–99)
POTASSIUM: 3.7 mmol/L (ref 3.5–5.1)
Sodium: 137 mmol/L (ref 135–145)
TOTAL PROTEIN: 8.1 g/dL (ref 6.5–8.1)

## 2015-12-21 LAB — CBC
HEMATOCRIT: 45.8 % (ref 36.0–46.0)
Hemoglobin: 15.1 g/dL — ABNORMAL HIGH (ref 12.0–15.0)
MCH: 30.9 pg (ref 26.0–34.0)
MCHC: 33 g/dL (ref 30.0–36.0)
MCV: 93.7 fL (ref 78.0–100.0)
Platelets: 293 10*3/uL (ref 150–400)
RBC: 4.89 MIL/uL (ref 3.87–5.11)
RDW: 14 % (ref 11.5–15.5)
WBC: 16.1 10*3/uL — ABNORMAL HIGH (ref 4.0–10.5)

## 2015-12-21 LAB — TROPONIN I
TROPONIN I: 0.46 ng/mL — AB (ref <0.031)
TROPONIN I: 0.42 ng/mL — AB (ref <0.031)

## 2015-12-21 MED ORDER — IPRATROPIUM-ALBUTEROL 0.5-2.5 (3) MG/3ML IN SOLN
3.0000 mL | RESPIRATORY_TRACT | Status: DC
  Administered 2015-12-21: 3 mL via RESPIRATORY_TRACT
  Filled 2015-12-21: qty 3

## 2015-12-21 MED ORDER — GI COCKTAIL ~~LOC~~
30.0000 mL | Freq: Once | ORAL | Status: AC
  Administered 2015-12-21: 30 mL via ORAL
  Filled 2015-12-21: qty 30

## 2015-12-21 MED ORDER — PREDNISONE 20 MG PO TABS: 40.0000 mg | ORAL_TABLET | Freq: Once | ORAL | Status: DC

## 2015-12-21 MED ORDER — PREDNISONE 20 MG PO TABS
40.0000 mg | ORAL_TABLET | Freq: Once | ORAL | Status: AC
  Administered 2015-12-21: 40 mg via ORAL
  Filled 2015-12-21: qty 2

## 2015-12-21 MED ORDER — MELOXICAM 7.5 MG PO TABS
ORAL_TABLET | ORAL | Status: AC
Start: 2015-12-21 — End: 2015-12-21
  Filled 2015-12-21: qty 1

## 2015-12-21 MED ORDER — ONDANSETRON HCL 4 MG/2ML IJ SOLN
4.0000 mg | Freq: Once | INTRAMUSCULAR | Status: AC
  Administered 2015-12-21: 4 mg via INTRAVENOUS
  Filled 2015-12-21: qty 2

## 2015-12-21 MED ORDER — DEXTROSE 5 % IV SOLN
500.0000 mg | Freq: Once | INTRAVENOUS | Status: AC
  Administered 2015-12-21: 500 mg via INTRAVENOUS
  Filled 2015-12-21: qty 500

## 2015-12-21 MED ORDER — AZITHROMYCIN 250 MG PO TABS: 250.0000 mg | ORAL_TABLET | Freq: Every day | ORAL | Status: DC

## 2015-12-21 MED ORDER — MELOXICAM 7.5 MG PO TABS: 7.5000 mg | ORAL_TABLET | Freq: Every day | ORAL | Status: DC

## 2015-12-21 MED ORDER — ACETAMINOPHEN 325 MG PO TABS
650.0000 mg | ORAL_TABLET | Freq: Once | ORAL | Status: AC
  Administered 2015-12-21: 650 mg via ORAL
  Filled 2015-12-21: qty 2

## 2015-12-21 MED ORDER — MELOXICAM 15 MG PO TABS
7.5000 mg | ORAL_TABLET | Freq: Once | ORAL | Status: AC
  Administered 2015-12-21: 7.5 mg via ORAL
  Filled 2015-12-21: qty 1

## 2015-12-21 NOTE — ED Notes (Signed)
Spoke with pharmacy, they wanted me to ask patient if she had ever had Mobic before and had she ever had an allergic rx to NSAID?  Pt reports she has taken Ibuprofen before, but didn't want to take it while she was on her period", "it makes stomach hurt".  Pt denies ever taking Mobic in the past and responds, "no" when asked if she was allergic to non steriodal anti-inflammatory pills.

## 2015-12-21 NOTE — ED Notes (Signed)
EDP at bedside  

## 2015-12-21 NOTE — ED Notes (Signed)
Pt up to bathroom, ambulating with no problems.

## 2015-12-21 NOTE — ED Notes (Signed)
Patient vitals are WNL. Patient lying on stretcher with no distress noted.

## 2015-12-21 NOTE — ED Provider Notes (Addendum)
CSN: [id number]     Arrival date & time 12/21/15  1649 History   First MD Initiated Contact with Patient 12/21/15 1822     Chief Complaint  Patient presents with  . Chest Pain     (Consider location/radiation/quality/duration/timing/severity/associated sxs/prior Treatment) Patient is a 40 y.o. female presenting with chest pain.  Chest Pain Pain location:  L chest and substernal area Pain quality: sharp   Pain radiates to:  Does not radiate Pain radiates to the back: no   Chronicity:  New Context: breathing (worse with breathing) and movement   Relieved by:  None tried Worsened by:  Certain positions, deep breathing and coughing Ineffective treatments:  None tried Associated symptoms: no abdominal pain, no back pain, no cough, no dizziness, no fatigue, no fever, no shortness of breath and not vomiting     Past Medical History  Diagnosis Date  . Diabetes mellitus without complication (HCC)   . Hypertension   . Anxiety   . MI (myocardial infarction) (HCC) 10/2015    elevated troponin, likely due to vasospasm, clean coronary arteries on cath  . Chronic pelvic pain in female   . Chronic abdominal pain   . History of cardiac catheterization 11/06/2015    normal coronary arteries  . History of echocardiogram 11/06/2015    normal  . GERD (gastroesophageal reflux disease)   . Panic attack    Past Surgical History  Procedure Laterality Date  . Cesarean section    . Cardiac catheterization N/A 11/06/2015    Procedure: Left Heart Cath and Coronary Angiography;  Surgeon: Thurmon Fair, MD;  Location: [facility];  Service: Cardiovascular;  Laterality: N/A;   History reviewed. No pertinent family history. Social History  Substance Use Topics  . Smoking status: Current Every Day Smoker -- 0.50 packs/day for 5 years    Types: Cigarettes  . Smokeless tobacco: None  . Alcohol Use: No   OB History    Gravida Para Term Preterm AB TAB SAB Ectopic Multiple Living            2     Review of Systems  Constitutional: Negative for fever, chills and fatigue.  Respiratory: Negative for cough, choking and shortness of breath.   Cardiovascular: Positive for chest pain.  Gastrointestinal: Negative for vomiting and abdominal pain.  Endocrine: Negative for polydipsia and polyuria.  Genitourinary: Negative for dysuria.  Musculoskeletal: Negative for back pain and joint swelling.  Skin: Negative for pallor and wound.  Neurological: Negative for dizziness.  All other systems reviewed and are negative.     Allergies  Ciprofloxacin; Penicillins; Amoxicillin; Codeine; Metronidazole; Sulfa antibiotics; and Toradol  Home Medications   Prior to Admission medications   Medication Sig Start Date End Date Taking? Authorizing Provider  albuterol (PROAIR HFA) 108 (90 Base) MCG/ACT inhaler Inhale 2 puffs into the lungs every 6 (six) hours as needed for wheezing or shortness of breath. 11/19/15  Yes Sanjuana Kava, NP  beclomethasone (QVAR) 80 MCG/ACT inhaler Inhale 2 puffs into the lungs 2 (two) times daily.   Yes Historical Provider, MD  clonazePAM (KLONOPIN) 0.5 MG tablet Take 1 tablet (0.5 mg total) by mouth 3 (three) times daily as needed for anxiety. 12/19/15  Yes Bethann Berkshire, MD  gabapentin (NEURONTIN) 100 MG capsule Take 1 capsule (100 mg total) by mouth 3 (three) times daily. For agitation 11/19/15  Yes Sanjuana Kava, NP  metoprolol tartrate (LOPRESSOR) 25 MG tablet Take 1 tablet (25 mg total) by mouth 2 (  two) times daily. For high blood pressure 12/03/15  Yes Marily Memos, MD  mirtazapine (REMERON) 30 MG tablet Take 1 tablet (30 mg total) by mouth at bedtime. For depression/sleep 11/19/15  Yes Sanjuana Kava, NP  omeprazole (PRILOSEC OTC) 20 MG tablet Take 20 mg by mouth daily.   Yes Historical Provider, MD  traMADol (ULTRAM) 50 MG tablet Take 1 tablet (50 mg total) by mouth every 6 (six) hours as needed. 12/19/15  Yes Bethann Berkshire, MD  azithromycin (ZITHROMAX) 250 MG tablet Take  1 tablet (250 mg total) by mouth daily. Take 1 every day until finished. 12/21/15   Marily Memos, MD  meloxicam (MOBIC) 7.5 MG tablet Take 1 tablet (7.5 mg total) by mouth daily. 12/21/15   Marily Memos, MD  predniSONE (DELTASONE) 20 MG tablet Take 2 tablets (40 mg total) by mouth once. 12/22/15   Barbara Cower Erum Cercone, MD   BP 135/83 mmHg  Pulse 84  Resp 18  Ht  (1.626 m)  Wt 170 lb (77.111 kg)  BMI 29.17 kg/m2  SpO2 98%  LMP 11/17/2015 Physical Exam  Constitutional: She is oriented to person, place, and time. She appears well-developed and well-nourished.  HENT:  Head: Normocephalic and atraumatic.  Neck: Normal range of motion.  Cardiovascular: Normal rate and regular rhythm.   Tachycardic in triage, improved on my exam.  Pulmonary/Chest: Effort normal. No stridor. No respiratory distress. She has wheezes.  Abdominal: Soft. She exhibits no distension. There is no tenderness.  Musculoskeletal: She exhibits no edema or tenderness.  Neurological: She is alert and oriented to person, place, and time.  Skin: Skin is warm and dry. No erythema.  Nursing note and vitals reviewed.   ED Course  Procedures (including critical care time) Labs Review Labs Reviewed  CBC - Abnormal; Notable for the following:    WBC 16.1 (*)    Hemoglobin 15.1 (*)    All other components within normal limits  COMPREHENSIVE METABOLIC PANEL - Abnormal; Notable for the following:    Glucose, Bld 112 (*)    All other components within normal limits  TROPONIN I - Abnormal; Notable for the following:    Troponin I 0.46 (*)    All other components within normal limits  TROPONIN I - Abnormal; Notable for the following:    Troponin I 0.42 (*)    All other components within normal limits    Imaging Review Dg Chest 2 View  12/21/2015  CLINICAL DATA:  40 year old female with history of chest pain today. EXAM: CHEST  2 VIEW COMPARISON:  Chest x-ray 12/18/2015. FINDINGS: Mild diffuse peribronchial cuffing. Lung  volumes are normal. No consolidative airspace disease. No pleural effusions. No pneumothorax. No pulmonary nodule or mass noted. Pulmonary vasculature and the cardiomediastinal silhouette are within normal limits. IMPRESSION: 1. Mild diffuse peribronchial cuffing, suggestive of an acute bronchitis. Electronically Signed   By: Trudie Reed M.D.   On: 12/21/2015 18:12   I have personally reviewed and evaluated these images and lab results as part of my medical decision-making.   EKG Interpretation   Date/Time:  Monday Dec 21 2015 17:00:13 EDT Ventricular Rate:  128 PR Interval:  122 QRS Duration: 76 QT Interval:  318 QTC Calculation: 464 R Axis:   -33 Text Interpretation:  Sinus tachycardia Possible Left atrial enlargement  Left axis deviation Abnormal ECG aside from tachycardia, no significant  chagne from previous Confirmed by [facility] MD, Barbara Cower [phone]) on 12/21/2015  6:36:27 PM      MDM  Final diagnoses:  Chest pain, unspecified chest pain type  Bronchitis    Chest pain likely secondary to coughing acute bronchitis. Started antibiotics and a prescription for Mobic and steroids were given along with an inhaler. Delta troponins done to ensure no cardiac causes and consistent with her baseline. EKG without any acute changes. Low suspicion for pulmonary embolus. Like anxiety may be playing a role in her symptoms as well as she seemed to have vastly improved when she took her Klonopin.  New Prescriptions: Discharge Medication List as of 12/21/2015 10:19 PM    START taking these medications   Details  azithromycin (ZITHROMAX) 250 MG tablet Take 1 tablet (250 mg total) by mouth daily. Take 1 every day until finished., Starting 12/21/2015, Until Discontinued, Print    meloxicam (MOBIC) 7.5 MG tablet Take 1 tablet (7.5 mg total) by mouth daily., Starting 12/21/2015, Until Discontinued, Print    predniSONE (DELTASONE) 20 MG tablet Take 2 tablets (40 mg total) by mouth once., Starting  12/22/2015, Print         I have personally and contemperaneously reviewed labs and imaging and used in my decision making as above.   A medical screening exam was performed and I feel the patient has had an appropriate workup for their chief complaint at this time and likelihood of emergent condition existing is low and thus workup can continue on an outpatient basis.. Their vital signs are stable. They have been counseled on decision, discharge, follow up and which symptoms necessitate immediate return to the emergency department.  They verbally stated understanding and agreement with plan and discharged in stable condition.    Marily Memos, MD 12/21/15 2352  Marily Memos, MD 12/21/15 [phone]

## 2015-12-21 NOTE — ED Notes (Signed)
MD at bedside. 

## 2015-12-21 NOTE — ED Notes (Signed)
Pt states that she is having and episode of recurring chest pain.  States this is different from before because the pain is worse and hurts to the point she thinks she may pass out.

## 2015-12-21 NOTE — ED Notes (Signed)
Lab at bedside

## 2015-12-21 NOTE — ED Notes (Addendum)
Pt given ice water. Pt mother given coffee. Pt requesting to speak with Dr Clayborne Dana.  Dr Clayborne Dana made aware of request.

## 2015-12-22 ENCOUNTER — Encounter: Payer: Self-pay | Admitting: *Deleted

## 2015-12-22 ENCOUNTER — Ambulatory Visit: Payer: Self-pay | Admitting: Cardiology

## 2015-12-23 ENCOUNTER — Encounter (HOSPITAL_COMMUNITY): Payer: Self-pay | Admitting: Emergency Medicine

## 2015-12-23 ENCOUNTER — Emergency Department (HOSPITAL_COMMUNITY)
Admission: EM | Admit: 2015-12-23 | Discharge: 2015-12-23 | Disposition: A | Discharge status: Home / Self Care | Payer: Medicaid Other | Attending: Emergency Medicine | Admitting: Emergency Medicine

## 2015-12-23 DIAGNOSIS — R05 Cough: Secondary | ICD-10-CM | POA: Insufficient documentation

## 2015-12-23 DIAGNOSIS — R6889 Other general symptoms and signs: Secondary | ICD-10-CM

## 2015-12-23 DIAGNOSIS — I252 Old myocardial infarction: Secondary | ICD-10-CM | POA: Diagnosis not present

## 2015-12-23 DIAGNOSIS — R079 Chest pain, unspecified: Secondary | ICD-10-CM | POA: Diagnosis not present

## 2015-12-23 DIAGNOSIS — E119 Type 2 diabetes mellitus without complications: Secondary | ICD-10-CM | POA: Diagnosis not present

## 2015-12-23 DIAGNOSIS — I10 Essential (primary) hypertension: Secondary | ICD-10-CM | POA: Insufficient documentation

## 2015-12-23 DIAGNOSIS — F17210 Nicotine dependence, cigarettes, uncomplicated: Secondary | ICD-10-CM | POA: Insufficient documentation

## 2015-12-23 DIAGNOSIS — R110 Nausea: Secondary | ICD-10-CM | POA: Insufficient documentation

## 2015-12-23 MED ORDER — ONDANSETRON 4 MG PO TBDP: 4.0000 mg | ORAL_TABLET | Freq: Three times a day (TID) | ORAL | Status: DC | PRN

## 2015-12-23 MED ORDER — ONDANSETRON 4 MG PO TBDP
4.0000 mg | ORAL_TABLET | Freq: Once | ORAL | Status: AC
  Administered 2015-12-23: 4 mg via ORAL
  Filled 2015-12-23: qty 1

## 2015-12-23 NOTE — ED Notes (Signed)
EMS arrives with pt complaining of chest pain radiating into back x 2 weeks, dx with bronchitis earlier this week given a Zpack and steroids, pt states she is still coughing up green sputum.

## 2015-12-23 NOTE — ED Provider Notes (Signed)
CSN: [id number]     Arrival date & time 12/23/15  1141 History  By signing my name below, I, Bridgette Habermann, attest that this documentation has been prepared under the direction and in the presence of Rolland Porter, MD. Electronically Signed: Bridgette Habermann, ED Scribe. 12/23/2015. 12:33 PM.   Chief Complaint  Patient presents with  . Chest Pain   The history is provided by the patient. No language interpreter was used.   HPI Comments: Athelene Hursey is a 40 y.o. female who presents to the Emergency Department complaining of "excruciating" central chest pain onset two weeks ago. Patient reports associated coughing and nausea. Her chest pain is exacerbated with cough. Patient states she had a fever this morning (TMAX 101). She took Tylenol with relief. Pt was here 5 days ago for the same complaint and was prescribed an Zithromax and Mobic which she has taken with little relief. Patient reports decreased appetite due to chronic dental issues. Pt also states she has anxiety and has not followed up with PCP.   Past Medical History  Diagnosis Date  . Diabetes mellitus without complication (HCC)   . Hypertension   . Anxiety   . MI (myocardial infarction) (HCC) 10/2015    elevated troponin, likely due to vasospasm, clean coronary arteries on cath  . Chronic pelvic pain in female   . Chronic abdominal pain   . History of cardiac catheterization 11/06/2015    normal coronary arteries  . History of echocardiogram 11/06/2015    normal  . GERD (gastroesophageal reflux disease)   . Panic attack    Past Surgical History  Procedure Laterality Date  . Cesarean section    . Cardiac catheterization N/A 11/06/2015    Procedure: Left Heart Cath and Coronary Angiography;  Surgeon: Thurmon Fair, MD;  Location: [facility];  Service: Cardiovascular;  Laterality: N/A;   No family history on file. Social History  Substance Use Topics  . Smoking status: Current Every Day Smoker -- 0.50 packs/day for 5 years   Types: Cigarettes  . Smokeless tobacco: None  . Alcohol Use: No   OB History    Gravida Para Term Preterm AB TAB SAB Ectopic Multiple Living            2     Review of Systems  Constitutional: Negative for fever, chills, diaphoresis, appetite change and fatigue.  HENT: Positive for dental problem. Negative for mouth sores, sore throat and trouble swallowing.   Eyes: Negative for visual disturbance.  Respiratory: Positive for cough. Negative for chest tightness, shortness of breath and wheezing.   Cardiovascular: Positive for chest pain.  Gastrointestinal: Positive for nausea. Negative for vomiting, abdominal pain, diarrhea and abdominal distention.  Endocrine: Negative for polydipsia, polyphagia and polyuria.  Genitourinary: Negative for dysuria, frequency and hematuria.  Musculoskeletal: Negative for gait problem.  Skin: Negative for color change, pallor and rash.  Neurological: Negative for dizziness, syncope, light-headedness and headaches.  Hematological: Does not bruise/bleed easily.  Psychiatric/Behavioral: Negative for behavioral problems and confusion. The patient is nervous/anxious (chronic).       Allergies  Ciprofloxacin; Penicillins; Amoxicillin; Codeine; Metronidazole; Sulfa antibiotics; and Toradol  Home Medications   Prior to Admission medications   Medication Sig Start Date End Date Taking? Authorizing Provider  albuterol (PROAIR HFA) 108 (90 Base) MCG/ACT inhaler Inhale 2 puffs into the lungs every 6 (six) hours as needed for wheezing or shortness of breath. Patient not taking: Reported on 12/30/2015 11/19/15   Sanjuana Kava, NP  azithromycin (ZITHROMAX) 250 MG tablet Take 1 tablet (250 mg total) by mouth daily. Take 1 every day until finished. Patient not taking: Reported on 12/25/2015 12/21/15   Marily Memos, MD  beclomethasone (QVAR) 80 MCG/ACT inhaler Inhale 2 puffs into the lungs 2 (two) times daily.    Historical Provider, MD  clonazePAM (KLONOPIN) 0.5 MG  tablet Take 1 tablet (0.5 mg total) by mouth 3 (three) times daily as needed for anxiety. 12/26/15   Doug Sou, MD  gabapentin (NEURONTIN) 100 MG capsule Take 1 capsule (100 mg total) by mouth 3 (three) times daily. For agitation Patient not taking: Reported on 12/30/2015 11/19/15   Sanjuana Kava, NP  hydrOXYzine (ATARAX/VISTARIL) 25 MG tablet Take 1 tablet (25 mg total) by mouth 3 (three) times daily as needed. As needed for anxiety 12/26/15   Doug Sou, MD  meloxicam (MOBIC) 7.5 MG tablet Take 1 tablet (7.5 mg total) by mouth daily. Patient not taking: Reported on 12/25/2015 12/21/15   Marily Memos, MD  metoprolol (LOPRESSOR) 50 MG tablet Take 0.5 tablets (25 mg total) by mouth 2 (two) times daily. 12/26/15   Doug Sou, MD  mirtazapine (REMERON) 30 MG tablet Take 1 tablet (30 mg total) by mouth at bedtime. 12/26/15   Doug Sou, MD  omeprazole (PRILOSEC OTC) 20 MG tablet Take 20 mg by mouth daily.    Historical Provider, MD  ondansetron (ZOFRAN ODT) 4 MG disintegrating tablet Take 1 tablet (4 mg total) by mouth every 8 (eight) hours as needed for nausea. Patient not taking: Reported on 12/25/2015 12/23/15   Rolland Porter, MD  predniSONE (DELTASONE) 20 MG tablet Take 2 tablets (40 mg total) by mouth once. Patient not taking: Reported on 12/25/2015 12/22/15   Marily Memos, MD  traMADol (ULTRAM) 50 MG tablet Take 1 tablet (50 mg total) by mouth every 6 (six) hours as needed. Patient not taking: Reported on 12/25/2015 12/19/15   Bethann Berkshire, MD   BP 144/77 mmHg  Pulse 97  Temp(Src) 97.9 F (36.6 C) (Oral)  Resp 18  Ht 5\' 3"  (1.6 m)  Wt 170 lb (77.111 kg)  BMI 30.12 kg/m2  SpO2 100%  LMP 12/23/2015 Physical Exam  Constitutional: She is oriented to person, place, and time. She appears well-developed and well-nourished. No distress.  Appears in no acute distress  HENT:  Head: Normocephalic.  Poor dentition  Eyes: Conjunctivae are normal. Pupils are equal, round, and reactive to light. No  scleral icterus.  Neck: Normal range of motion. Neck supple. No thyromegaly present.  Cardiovascular: Normal rate and regular rhythm.  Exam reveals no gallop and no friction rub.   No murmur heard. Pulmonary/Chest: Effort normal and breath sounds normal. No respiratory distress. She has no wheezes. She has no rales.  Abdominal: Soft. Bowel sounds are normal. She exhibits no distension. There is no tenderness. There is no rebound.  Musculoskeletal: Normal range of motion.  Neurological: She is alert and oriented to person, place, and time.  Skin: Skin is warm and dry. No rash noted.  Psychiatric: She has a normal mood and affect. Her behavior is normal.  Nursing note and vitals reviewed.   ED Course  Procedures (including critical care time) DIAGNOSTIC STUDIES: Oxygen Saturation is 100% on RA, normal by my interpretation.    COORDINATION OF CARE: 12:30 PM Discussed treatment plan with pt at bedside and pt agreed to plan.   Labs Review Labs Reviewed - No data to display  Imaging Review Ct Head Wo Contrast  12/31/2015  CLINICAL DATA:  Altered mental status EXAM: CT HEAD WITHOUT CONTRAST TECHNIQUE: Contiguous axial images were obtained from the base of the skull through the vertex without intravenous contrast. COMPARISON:  None. FINDINGS: No mass lesion. No midline shift. No acute hemorrhage or hematoma. No extra-axial fluid collections. No evidence of acute infarction. Calvarium intact. Visualized portions of the paranasal sinuses clear. IMPRESSION: Normal head CT Electronically Signed   By: Esperanza Heir M.D.   On: 12/31/2015 12:18   I have personally reviewed and evaluated these images and lab results as part of my medical decision-making.   EKG Interpretation   Date/Time:  Wednesday Dec 23 2015 12:02:40 EDT Ventricular Rate:  93 PR Interval:  152 QRS Duration: 88 QT Interval:  365 QTC Calculation: 454 R Axis:   -82 Text Interpretation:  Sinus rhythm Left anterior fascicular  block Low  voltage, precordial leads Abnormal R-wave progression, late transition  Confirmed by RAY MD, Duwayne Heck ([id number]) on 12/24/2015 5:23:07 PM      MDM   Final diagnoses:  Multiple complaints    I personally performed the services described in this documentation, which was scribed in my presence. The recorded information has been reviewed and is accurate.      Rolland Porter, MD 12/31/15 2248

## 2015-12-23 NOTE — ED Notes (Signed)
Pt [facility], pt had appointment and cancelled, pt also in the program to get home visits, pt encourage to follow care these providers has offered. Pt is anxious with flat affect

## 2015-12-23 NOTE — Discharge Instructions (Signed)
Your examination today does not show any acute abnormalities.  Your symptoms are likely related to a viral process, and your anxiety.  Follow up with your primary care physician for treatment for anxiety.  Zofran for nausea.  Finish Zithromax.

## 2015-12-25 ENCOUNTER — Encounter (HOSPITAL_COMMUNITY): Payer: Self-pay | Admitting: Emergency Medicine

## 2015-12-25 ENCOUNTER — Emergency Department (HOSPITAL_COMMUNITY)
Admission: EM | Admit: 2015-12-25 | Discharge: 2015-12-26 | Disposition: A | Discharge status: Home / Self Care | Payer: Medicaid Other | Attending: Emergency Medicine | Admitting: Emergency Medicine

## 2015-12-25 DIAGNOSIS — E876 Hypokalemia: Secondary | ICD-10-CM | POA: Diagnosis not present

## 2015-12-25 DIAGNOSIS — Z79899 Other long term (current) drug therapy: Secondary | ICD-10-CM | POA: Insufficient documentation

## 2015-12-25 DIAGNOSIS — I252 Old myocardial infarction: Secondary | ICD-10-CM | POA: Insufficient documentation

## 2015-12-25 DIAGNOSIS — Z791 Long term (current) use of non-steroidal anti-inflammatories (NSAID): Secondary | ICD-10-CM | POA: Diagnosis not present

## 2015-12-25 DIAGNOSIS — K0889 Other specified disorders of teeth and supporting structures: Secondary | ICD-10-CM | POA: Insufficient documentation

## 2015-12-25 DIAGNOSIS — Z9114 Patient's other noncompliance with medication regimen: Secondary | ICD-10-CM | POA: Diagnosis not present

## 2015-12-25 DIAGNOSIS — I10 Essential (primary) hypertension: Secondary | ICD-10-CM | POA: Diagnosis not present

## 2015-12-25 DIAGNOSIS — F17210 Nicotine dependence, cigarettes, uncomplicated: Secondary | ICD-10-CM | POA: Diagnosis not present

## 2015-12-25 DIAGNOSIS — F419 Anxiety disorder, unspecified: Secondary | ICD-10-CM | POA: Diagnosis present

## 2015-12-25 DIAGNOSIS — R0789 Other chest pain: Secondary | ICD-10-CM | POA: Insufficient documentation

## 2015-12-25 DIAGNOSIS — E119 Type 2 diabetes mellitus without complications: Secondary | ICD-10-CM | POA: Insufficient documentation

## 2015-12-25 DIAGNOSIS — Z760 Encounter for issue of repeat prescription: Secondary | ICD-10-CM | POA: Insufficient documentation

## 2015-12-25 NOTE — ED Notes (Signed)
Bed: WA07 Expected date:  Expected time:  Means of arrival:  Comments: EMS 40yo Anxious / unable to sleep

## 2015-12-25 NOTE — ED Notes (Signed)
Pt states she is having mouth pain r/t 5 bad teeth. Pt c/o anxiety and reports she hasn't taken any psych meds, currently out of meds.

## 2015-12-25 NOTE — ED Notes (Signed)
Per EMS, pt complains of anxiety and "burning all over". EMS states the family called because they want the pt to go to [facility]. Pt has been without her psychiatric medication for the past 2 days. Pt has hx of anxiety and depression.

## 2015-12-26 ENCOUNTER — Emergency Department (HOSPITAL_COMMUNITY): Payer: Medicaid Other

## 2015-12-26 LAB — BASIC METABOLIC PANEL
Anion gap: 8 (ref 5–15)
BUN: 5 mg/dL — ABNORMAL LOW (ref 6–20)
CHLORIDE: 107 mmol/L (ref 101–111)
CO2: 25 mmol/L (ref 22–32)
Calcium: 9.2 mg/dL (ref 8.9–10.3)
Creatinine, Ser: 0.71 mg/dL (ref 0.44–1.00)
Glucose, Bld: 115 mg/dL — ABNORMAL HIGH (ref 65–99)
POTASSIUM: 3.3 mmol/L — AB (ref 3.5–5.1)
SODIUM: 140 mmol/L (ref 135–145)

## 2015-12-26 LAB — CBC WITH DIFFERENTIAL/PLATELET
BASOS ABS: 0 10*3/uL (ref 0.0–0.1)
BASOS PCT: 0 %
EOS ABS: 0.1 10*3/uL (ref 0.0–0.7)
EOS PCT: 1 %
HCT: 41.6 % (ref 36.0–46.0)
HEMOGLOBIN: 14.2 g/dL (ref 12.0–15.0)
Lymphocytes Relative: 16 %
Lymphs Abs: 2.7 10*3/uL (ref 0.7–4.0)
MCH: 30.9 pg (ref 26.0–34.0)
MCHC: 34.1 g/dL (ref 30.0–36.0)
MCV: 90.4 fL (ref 78.0–100.0)
Monocytes Absolute: 0.9 10*3/uL (ref 0.1–1.0)
Monocytes Relative: 5 %
NEUTROS PCT: 78 %
Neutro Abs: 13.4 10*3/uL — ABNORMAL HIGH (ref 1.7–7.7)
PLATELETS: 275 10*3/uL (ref 150–400)
RBC: 4.6 MIL/uL (ref 3.87–5.11)
RDW: 13.7 % (ref 11.5–15.5)
WBC: 17.2 10*3/uL — AB (ref 4.0–10.5)

## 2015-12-26 LAB — I-STAT TROPONIN, ED: TROPONIN I, POC: 0.01 ng/mL (ref 0.00–0.08)

## 2015-12-26 LAB — D-DIMER, QUANTITATIVE (NOT AT ARMC)

## 2015-12-26 MED ORDER — HYDROXYZINE HCL 25 MG PO TABS: 25.0000 mg | ORAL_TABLET | Freq: Three times a day (TID) | ORAL | Status: DC | PRN

## 2015-12-26 MED ORDER — AEROCHAMBER PLUS W/MASK MISC
1.0000 | Freq: Once | Status: DC
  Filled 2015-12-26: qty 1

## 2015-12-26 MED ORDER — ALBUTEROL SULFATE HFA 108 (90 BASE) MCG/ACT IN AERS
2.0000 | INHALATION_SPRAY | RESPIRATORY_TRACT | Status: DC | PRN
  Administered 2015-12-26: 2 via RESPIRATORY_TRACT
  Filled 2015-12-26: qty 6.7

## 2015-12-26 MED ORDER — CLONAZEPAM 0.5 MG PO TABS: 0.5000 mg | ORAL_TABLET | Freq: Three times a day (TID) | ORAL | Status: DC | PRN

## 2015-12-26 MED ORDER — MIRTAZAPINE 30 MG PO TABS: 30.0000 mg | ORAL_TABLET | Freq: Every day | ORAL | Status: DC

## 2015-12-26 MED ORDER — POTASSIUM CHLORIDE CRYS ER 20 MEQ PO TBCR
40.0000 meq | EXTENDED_RELEASE_TABLET | Freq: Once | ORAL | Status: AC
  Administered 2015-12-26: 40 meq via ORAL
  Filled 2015-12-26: qty 2

## 2015-12-26 MED ORDER — HYDROXYZINE HCL 25 MG PO TABS
25.0000 mg | ORAL_TABLET | Freq: Once | ORAL | Status: AC
  Administered 2015-12-26: 25 mg via ORAL
  Filled 2015-12-26: qty 1

## 2015-12-26 MED ORDER — CLONAZEPAM 0.5 MG PO TABS
0.5000 mg | ORAL_TABLET | Freq: Once | ORAL | Status: AC
  Administered 2015-12-26: 0.5 mg via ORAL
  Filled 2015-12-26: qty 1

## 2015-12-26 MED ORDER — METOPROLOL TARTRATE 25 MG PO TABS
25.0000 mg | ORAL_TABLET | Freq: Once | ORAL | Status: AC
  Administered 2015-12-26: 25 mg via ORAL
  Filled 2015-12-26: qty 1

## 2015-12-26 MED ORDER — METOPROLOL TARTRATE 50 MG PO TABS: 25.0000 mg | ORAL_TABLET | Freq: Two times a day (BID) | ORAL | Status: DC

## 2015-12-26 NOTE — Discharge Instructions (Signed)
Nonspecific Chest Pain Use your albuterol inhaler with spacer 2 puffs every 4 hours as needed for cough or shortness of breath. Return if needed more than every 4 hours. Contact your doctor at [facility] on June 5,2017 for refills on your prescriptions for your psychiatric medications. Contact your doctor at the health department for refills of other medications including . Keep your scheduled appointment with your oral surgeon later this month. Ask your doctors to help you to stop smoking. It is often hard to find the cause of chest pain. There is always a chance that your pain could be related to something serious, such as a heart attack or a blood clot in your lungs. Chest pain can also be caused by conditions that are not life-threatening. If you have chest pain, it is very important to follow up with your doctor.  HOME CARE  If you were prescribed an antibiotic medicine, finish it all even if you start to feel better.  Avoid any activities that cause chest pain.  Do not use any tobacco products, including cigarettes, chewing tobacco, or electronic cigarettes. If you need help quitting, ask your doctor.  Do not drink alcohol.  Take medicines only as told by your doctor.  Keep all follow-up visits as told by your doctor. This is important. This includes any further testing if your chest pain does not go away.  Your doctor may tell you to keep your head raised (elevated) while you sleep.  Make lifestyle changes as told by your doctor. These may include:  Getting regular exercise. Ask your doctor to suggest some activities that are safe for you.  Eating a heart-healthy diet. Your doctor or a diet specialist (dietitian) can help you to learn healthy eating options.  Maintaining a healthy weight.  Managing diabetes, if necessary.  Reducing stress. GET HELP IF:  Your chest pain does not go away, even after treatment.  You have a rash with blisters on your chest.  You have a  fever. GET HELP RIGHT AWAY IF:  Your chest pain is worse.  You have an increasing cough, or you cough up blood.  You have severe belly (abdominal) pain.  You feel extremely weak.  You pass out (faint).  You have chills.  You have sudden, unexplained chest discomfort.  You have sudden, unexplained discomfort in your arms, back, neck, or jaw.  You have shortness of breath at any time.  You suddenly start to sweat, or your skin gets clammy.  You feel nauseous.  You vomit.  You suddenly feel light-headed or dizzy.  Your heart begins to beat quickly, or it feels like it is skipping beats. These symptoms may be an emergency. Do not wait to see if the symptoms will go away. Get medical help right away. Call your local emergency services (911 in the [place] Do not drive yourself to the hospital.   This information is not intended to replace advice given to you by your health care provider. Make sure you discuss any questions you have with your health care provider.   Document Released: 12/28/2007 Document Revised: 08/01/2014 Document Reviewed: 02/14/2014 Elsevier Interactive Patient Education [employer]

## 2015-12-26 NOTE — ED Notes (Signed)
Pt transported to xray 

## 2015-12-26 NOTE — ED Provider Notes (Signed)
CSN: [id number]     Arrival date & time 12/25/15  2218 History   First MD Initiated Contact with Patient 12/26/15 0011     Chief Complaint  Patient presents with  . Anxiety  . Medication Refill     (Consider location/radiation/quality/duration/timing/severity/associated sxs/prior Treatment) HPI Complains of anterior chest pain, pleuritic in nature worse with deep inspiration covered by shortness of breath onset upon awakening 7 AM today. She also reports dental pain for the past several weeks. No fever. No cough. Nothing makes pain better or worse. Patient also ran out of her Remeron Lopressor Vistaril and Klonopin within the past 4 days. Past Medical History  Diagnosis Date  . Diabetes mellitus without complication (HCC)   . Hypertension   . Anxiety   . MI (myocardial infarction) (HCC) 10/2015    elevated troponin, likely due to vasospasm, clean coronary arteries on cath  . Chronic pelvic pain in female   . Chronic abdominal pain   . History of cardiac catheterization 11/06/2015    normal coronary arteries  . History of echocardiogram 11/06/2015    normal  . GERD (gastroesophageal reflux disease)   . Panic attack    Past Surgical History  Procedure Laterality Date  . Cesarean section    . Cardiac catheterization N/A 11/06/2015    Procedure: Left Heart Cath and Coronary Angiography;  Surgeon: Thurmon Fair, MD;  Location: [facility];  Service: Cardiovascular;  Laterality: N/A;  Normal coronary arteries 11/06/2015 History reviewed. No pertinent family history. Social History  Substance Use Topics  . Smoking status: Current Every Day Smoker -- 0.50 packs/day for 5 years    Types: Cigarettes  . Smokeless tobacco: None  . Alcohol Use: No   OB History    Gravida Para Term Preterm AB TAB SAB Ectopic Multiple Living            2     Review of Systems  Constitutional: Negative.   HENT: Positive for dental problem.   Respiratory: Positive for shortness of breath.    Cardiovascular: Positive for chest pain.  Gastrointestinal: Negative.   Genitourinary:       Currently on menses  Musculoskeletal: Negative.   Skin: Negative.   Neurological: Negative.   Psychiatric/Behavioral: Negative.   All other systems reviewed and are negative.     Allergies  Ciprofloxacin; Penicillins; Amoxicillin; Codeine; Metronidazole; Sulfa antibiotics; and Toradol  Home Medications   Prior to Admission medications   Medication Sig Start Date End Date Taking? Authorizing Provider  albuterol (PROAIR HFA) 108 (90 Base) MCG/ACT inhaler Inhale 2 puffs into the lungs every 6 (six) hours as needed for wheezing or shortness of breath. 11/19/15  Yes Sanjuana Kava, NP  beclomethasone (QVAR) 80 MCG/ACT inhaler Inhale 2 puffs into the lungs 2 (two) times daily.   Yes Historical Provider, MD  clonazePAM (KLONOPIN) 0.5 MG tablet Take 1 tablet (0.5 mg total) by mouth 3 (three) times daily as needed for anxiety. 12/19/15  Yes Bethann Berkshire, MD  gabapentin (NEURONTIN) 100 MG capsule Take 1 capsule (100 mg total) by mouth 3 (three) times daily. For agitation 11/19/15  Yes Sanjuana Kava, NP  hydrOXYzine (ATARAX/VISTARIL) 25 MG tablet Take 25 mg by mouth every 6 (six) hours as needed for anxiety.   Yes Historical Provider, MD  metoprolol tartrate (LOPRESSOR) 25 MG tablet Take 1 tablet (25 mg total) by mouth 2 (two) times daily. For high blood pressure 12/03/15  Yes Marily Memos, MD  mirtazapine (REMERON) 30  MG tablet Take 1 tablet (30 mg total) by mouth at bedtime. For depression/sleep 11/19/15  Yes Sanjuana Kava, NP  omeprazole (PRILOSEC OTC) 20 MG tablet Take 20 mg by mouth daily.   Yes Historical Provider, MD  azithromycin (ZITHROMAX) 250 MG tablet Take 1 tablet (250 mg total) by mouth daily. Take 1 every day until finished. Patient not taking: Reported on 12/25/2015 12/21/15   Marily Memos, MD  meloxicam (MOBIC) 7.5 MG tablet Take 1 tablet (7.5 mg total) by mouth daily. Patient not taking:  Reported on 12/25/2015 12/21/15   Marily Memos, MD  ondansetron (ZOFRAN ODT) 4 MG disintegrating tablet Take 1 tablet (4 mg total) by mouth every 8 (eight) hours as needed for nausea. Patient not taking: Reported on 12/25/2015 12/23/15   Rolland Porter, MD  predniSONE (DELTASONE) 20 MG tablet Take 2 tablets (40 mg total) by mouth once. Patient not taking: Reported on 12/25/2015 12/22/15   Marily Memos, MD  traMADol (ULTRAM) 50 MG tablet Take 1 tablet (50 mg total) by mouth every 6 (six) hours as needed. Patient not taking: Reported on 12/25/2015 12/19/15   Bethann Berkshire, MD   BP 162/102 mmHg  Pulse 87  Temp(Src) 98.3 F (36.8 C) (Oral)  Resp 20  SpO2 100%  LMP 12/23/2015 Physical Exam  Constitutional: She appears well-developed and well-nourished.  HENT:  Head: Normocephalic and atraumatic.  Diffusely poor dentition  Eyes: Conjunctivae are normal. Pupils are equal, round, and reactive to light.  Neck: Neck supple. No tracheal deviation present. No thyromegaly present.  Cardiovascular: Normal rate and regular rhythm.   No murmur heard. Pulmonary/Chest: Effort normal and breath sounds normal.  Abdominal: Soft. Bowel sounds are normal. She exhibits no distension. There is no tenderness.  Musculoskeletal: Normal range of motion. She exhibits no edema or tenderness.  Neurological: She is alert. Coordination normal.  Skin: Skin is warm and dry. No rash noted.  Psychiatric: She has a normal mood and affect.  Nursing note and vitals reviewed.   ED Course  Procedures (including critical care time) Labs Review Labs Reviewed - No data to display  Imaging Review No results found. I have personally reviewed and evaluated these images and lab results as part of my medical decision-making.   EKG Interpretation   Date/Time:  Saturday December 26 2015 00:49:05 EDT Ventricular Rate:  78 PR Interval:  149 QRS Duration: 91 QT Interval:  410 QTC Calculation: 467 R Axis:   -73 Text Interpretation:  Sinus  rhythm Probable left atrial enlargement Left  anterior fascicular block Borderline T abnormalities, inferior leads  Baseline wander in lead(s) I aVR No significant change since last tracing  Confirmed by Ethelda Chick  MD, Sung Parodi [phone]) on 12/26/2015 1:06:42 AM     Results for orders placed or performed during the hospital encounter of 12/25/15  D-dimer, quantitative (not at [facility])  Result Value Ref Range   D-Dimer, Quant <0.27 0.00 - 0.50 ug/mL-FEU  CBC with Differential/Platelet  Result Value Ref Range   WBC 17.2 (H) 4.0 - 10.5 K/uL   RBC 4.60 3.87 - 5.11 MIL/uL   Hemoglobin 14.2 12.0 - 15.0 g/dL   HCT 60.4 54.0 - 98.1 %   MCV 90.4 78.0 - 100.0 fL   MCH 30.9 26.0 - 34.0 pg   MCHC 34.1 30.0 - 36.0 g/dL   RDW 19.1 47.8 - 29.5 %   Platelets 275 150 - 400 K/uL   Neutrophils Relative % 78 %   Neutro Abs 13.4 (H) 1.7 - 7.7  K/uL   Lymphocytes Relative 16 %   Lymphs Abs 2.7 0.7 - 4.0 K/uL   Monocytes Relative 5 %   Monocytes Absolute 0.9 0.1 - 1.0 K/uL   Eosinophils Relative 1 %   Eosinophils Absolute 0.1 0.0 - 0.7 K/uL   Basophils Relative 0 %   Basophils Absolute 0.0 0.0 - 0.1 K/uL  Basic metabolic panel  Result Value Ref Range   Sodium 140 135 - 145 mmol/L   Potassium 3.3 (L) 3.5 - 5.1 mmol/L   Chloride 107 101 - 111 mmol/L   CO2 25 22 - 32 mmol/L   Glucose, Bld 115 (H) 65 - 99 mg/dL   BUN 5 (L) 6 - 20 mg/dL   Creatinine, Ser 6.38 0.44 - 1.00 mg/dL   Calcium 9.2 8.9 - 46.6 mg/dL   GFR calc non Af Amer >60 >60 mL/min   GFR calc Af Amer >60 >60 mL/min   Anion gap 8 5 - 15  I-stat troponin, ED  Result Value Ref Range   Troponin i, poc 0.01 0.00 - 0.08 ng/mL   Comment 3           Dg Chest 2 View  12/26/2015  CLINICAL DATA:  Subacute onset of mid chest pain. Initial encounter. EXAM: CHEST  2 VIEW COMPARISON:  Chest radiograph performed 12/21/2015 FINDINGS: The lungs are well-aerated. Mild peribronchial thickening is noted. There is no evidence of focal opacification, pleural effusion  or pneumothorax. The heart is normal in size; the mediastinal contour is within normal limits. No acute osseous abnormalities are seen. IMPRESSION: Mild peribronchial thickening noted.  Lungs otherwise clear. Electronically Signed   By: Roanna Raider M.D.   On: 12/26/2015 01:12   Dg Chest 2 View  12/21/2015  CLINICAL DATA:  40 year old female with history of chest pain today. EXAM: CHEST  2 VIEW COMPARISON:  Chest x-ray 12/18/2015. FINDINGS: Mild diffuse peribronchial cuffing. Lung volumes are normal. No consolidative airspace disease. No pleural effusions. No pneumothorax. No pulmonary nodule or mass noted. Pulmonary vasculature and the cardiomediastinal silhouette are within normal limits. IMPRESSION: 1. Mild diffuse peribronchial cuffing, suggestive of an acute bronchitis. Electronically Signed   By: Trudie Reed M.D.   On: 12/21/2015 18:12   Dg Chest 2 View  12/05/2015  CLINICAL DATA:  Anxious feelings. Left-sided chest pain. Shortness of breath. EXAM: CHEST  2 VIEW COMPARISON:  11/14/2015 and previous FINDINGS: Heart size is normal. Mediastinal shadows are normal. The lungs are clear. There is mild emphysema. No effusions. No bony abnormality. IMPRESSION: No active disease.  Emphysema. Electronically Signed   By: Paulina Fusi M.D.   On: 12/05/2015 17:12   US Transvaginal Non-ob  12/08/2015  CLINICAL DATA:  Upper abdomen pain for 3 months. EXAM: TRANSABDOMINAL AND TRANSVAGINAL ULTRASOUND OF PELVIS TECHNIQUE: Both transabdominal and transvaginal ultrasound examinations of the pelvis were performed. Transabdominal technique was performed for global imaging of the pelvis including uterus, ovaries, adnexal regions, and pelvic cul-de-sac. It was necessary to proceed with endovaginal exam following the transabdominal exam to visualize the ovaries and uterus. COMPARISON:  None FINDINGS: Uterus Measurements: 8.6 x 4.2 x 4.9 cm. No fibroids or other mass visualized. Endometrium Thickness: 2 mm.  No focal  abnormality visualized. Right ovary Measurements: 3.7 x 3 x 2.8 Cm. There is a 2.8 cm septated cyst. Left ovary Measurements: 3.4 x 2.4 x 2.2 cm. Normal appearance/no adnexal mass. Other findings No abnormal free fluid. IMPRESSION: Minimal septated is in the right ovary likely follicular cyst. Exam  is otherwise normal. Electronically Signed   By: Sherian Rein M.D.   On: 12/08/2015 18:07   US Pelvis Complete  12/08/2015  CLINICAL DATA:  Upper abdomen pain for 3 months. EXAM: TRANSABDOMINAL AND TRANSVAGINAL ULTRASOUND OF PELVIS TECHNIQUE: Both transabdominal and transvaginal ultrasound examinations of the pelvis were performed. Transabdominal technique was performed for global imaging of the pelvis including uterus, ovaries, adnexal regions, and pelvic cul-de-sac. It was necessary to proceed with endovaginal exam following the transabdominal exam to visualize the ovaries and uterus. COMPARISON:  None FINDINGS: Uterus Measurements: 8.6 x 4.2 x 4.9 cm. No fibroids or other mass visualized. Endometrium Thickness: 2 mm.  No focal abnormality visualized. Right ovary Measurements: 3.7 x 3 x 2.8 Cm. There is a 2.8 cm septated cyst. Left ovary Measurements: 3.4 x 2.4 x 2.2 cm. Normal appearance/no adnexal mass. Other findings No abnormal free fluid. IMPRESSION: Minimal septated is in the right ovary likely follicular cyst. Exam is otherwise normal. Electronically Signed   By: Sherian Rein M.D.   On: 12/08/2015 18:07   Ct Abdomen Pelvis W Contrast  12/03/2015  CLINICAL DATA:  Patient with right upper quadrant pain radiating to the back. EXAM: CT ABDOMEN AND PELVIS WITH CONTRAST TECHNIQUE: Multidetector CT imaging of the abdomen and pelvis was performed using the standard protocol following bolus administration of intravenous contrast. CONTRAST:  ISOVUE-300 IOPAMIDOL (ISOVUE-300) INJECTION 61% COMPARISON:  CT renal stone 11/10/2015 FINDINGS: Lower chest: Normal heart size. Lung bases are clear. No pleural  effusion. Hepatobiliary: Liver is normal in size and contour. No focal lesion identified. Gallbladder is unremarkable. Pancreas: Unremarkable Spleen: Unremarkable Adrenals/Urinary Tract: Adrenal glands are normal. Kidneys enhance symmetrically with contrast. No hydronephrosis. Urinary bladder is unremarkable. Stomach/Bowel: No abnormal bowel wall thickening or evidence for bowel obstruction. No free fluid or free intraperitoneal air. Vascular/Lymphatic: Normal caliber abdominal aorta. No retroperitoneal lymphadenopathy. Other: Uterus and adnexal structures are unremarkable. Musculoskeletal: No aggressive or acute appearing osseous lesions. IMPRESSION: No acute process within the abdomen or pelvis. Electronically Signed   By: Annia Belt M.D.   On: 12/03/2015 21:51   Dg Chest Portable 1 View  12/18/2015  CLINICAL DATA:  Left-sided chest pain, shortness of breath EXAM: PORTABLE CHEST 1 VIEW COMPARISON:  12/05/2015 FINDINGS: The heart size and mediastinal contours are within normal limits. Both lungs are clear. The visualized skeletal structures are unremarkable. IMPRESSION: No active disease. Electronically Signed   By: Elige Ko   On: 12/18/2015 15:20   Dg Abd 2 Views  12/18/2015  CLINICAL DATA:  Acute onset of nausea and generalized abdominal pain. Hematochezia. Left-sided chest pain. Initial encounter. EXAM: ABDOMEN - 2 VIEW COMPARISON:  CT of the abdomen and pelvis performed 12/03/2015 FINDINGS: The visualized bowel gas pattern is unremarkable. Scattered air and stool filled loops of colon are seen; no abnormal dilatation of small bowel loops is seen to suggest small bowel obstruction. No free intra-abdominal air is identified, though evaluation for free air is limited on a single supine view. The visualized osseous structures are within normal limits; the sacroiliac joints are unremarkable in appearance. The visualized lung bases are essentially clear. IMPRESSION: Unremarkable bowel gas pattern; no free  intra-abdominal air seen. Small to moderate amount of stool noted in the colon. Electronically Signed   By: Roanna Raider M.D.   On: 12/18/2015 19:56   US Abdomen Limited Ruq  12/07/2015  CLINICAL DATA:  Right flank pain x2 days, vomiting EXAM: US ABDOMEN LIMITED - RIGHT UPPER QUADRANT COMPARISON:  CT abdomen pelvis dated 12/03/2015 FINDINGS: Gallbladder: In no gallstones, gallbladder wall thickening, or pericholecystic fluid. Negative sonographic Murphy's sign Common bile duct: Diameter: 5 mm Liver: No focal lesion identified. Within normal limits in parenchymal echogenicity. IMPRESSION: Negative right upper quadrant ultrasound. Electronically Signed   By: Charline Bills M.D.   On: 12/07/2015 14:02   Chest x-ray viewed by me  1:40 AM patient feels improved after treatment with clonazepam and hydroxyzine. I counseled patient for 5 minutes on smoking cessation MDM  Heart score equals 3. Low pretest clinical probability for pulmonary embolism. Negative d-dimer. Final diagnoses:  None  Plan prescriptions Klonopin, Atarax, Remeron, Lopressor. [facility]. She is also to follow-up with health department for refill of blood pressure medications. She has an appointment with an oral surgeon later this month for dental pain Diagnosis #1 atypical chest pain #2 dental pain #3 hypokalemia #4 medication noncompliance #5 tobacco abuse   Doug Sou, MD 12/26/15 0144

## 2015-12-30 ENCOUNTER — Encounter (HOSPITAL_COMMUNITY): Payer: Self-pay | Admitting: Emergency Medicine

## 2015-12-30 ENCOUNTER — Emergency Department (HOSPITAL_COMMUNITY)
Admission: EM | Admit: 2015-12-30 | Discharge: 2016-01-01 | Disposition: A | Discharge status: Other Acute Inpatient Hospital | Payer: Medicaid Other | Attending: Emergency Medicine | Admitting: Emergency Medicine

## 2015-12-30 DIAGNOSIS — F332 Major depressive disorder, recurrent severe without psychotic features: Secondary | ICD-10-CM | POA: Diagnosis not present

## 2015-12-30 DIAGNOSIS — R0981 Nasal congestion: Secondary | ICD-10-CM | POA: Insufficient documentation

## 2015-12-30 DIAGNOSIS — R42 Dizziness and giddiness: Secondary | ICD-10-CM | POA: Insufficient documentation

## 2015-12-30 DIAGNOSIS — F17210 Nicotine dependence, cigarettes, uncomplicated: Secondary | ICD-10-CM | POA: Insufficient documentation

## 2015-12-30 DIAGNOSIS — F419 Anxiety disorder, unspecified: Secondary | ICD-10-CM | POA: Insufficient documentation

## 2015-12-30 DIAGNOSIS — E119 Type 2 diabetes mellitus without complications: Secondary | ICD-10-CM | POA: Diagnosis not present

## 2015-12-30 DIAGNOSIS — R45851 Suicidal ideations: Secondary | ICD-10-CM

## 2015-12-30 DIAGNOSIS — Z046 Encounter for general psychiatric examination, requested by authority: Secondary | ICD-10-CM | POA: Diagnosis present

## 2015-12-30 DIAGNOSIS — I252 Old myocardial infarction: Secondary | ICD-10-CM | POA: Insufficient documentation

## 2015-12-30 DIAGNOSIS — I10 Essential (primary) hypertension: Secondary | ICD-10-CM | POA: Insufficient documentation

## 2015-12-30 DIAGNOSIS — Z79899 Other long term (current) drug therapy: Secondary | ICD-10-CM | POA: Insufficient documentation

## 2015-12-30 DIAGNOSIS — X838XXA Intentional self-harm by other specified means, initial encounter: Secondary | ICD-10-CM

## 2015-12-30 DIAGNOSIS — X781XXA Intentional self-harm by knife, initial encounter: Secondary | ICD-10-CM | POA: Insufficient documentation

## 2015-12-30 LAB — CBC WITH DIFFERENTIAL/PLATELET
BASOS ABS: 0.1 10*3/uL (ref 0.0–0.1)
BASOS PCT: 0 %
EOS ABS: 0.1 10*3/uL (ref 0.0–0.7)
Eosinophils Relative: 1 %
HEMATOCRIT: 44.5 % (ref 36.0–46.0)
Hemoglobin: 14.7 g/dL (ref 12.0–15.0)
Lymphocytes Relative: 22 %
Lymphs Abs: 3.5 10*3/uL (ref 0.7–4.0)
MCH: 30.6 pg (ref 26.0–34.0)
MCHC: 33 g/dL (ref 30.0–36.0)
MCV: 92.5 fL (ref 78.0–100.0)
MONO ABS: 1.2 10*3/uL — AB (ref 0.1–1.0)
Monocytes Relative: 7 %
NEUTROS ABS: 11.3 10*3/uL — AB (ref 1.7–7.7)
Neutrophils Relative %: 70 %
Platelets: 279 10*3/uL (ref 150–400)
RBC: 4.81 MIL/uL (ref 3.87–5.11)
RDW: 13.7 % (ref 11.5–15.5)
WBC: 16.1 10*3/uL — ABNORMAL HIGH (ref 4.0–10.5)

## 2015-12-30 LAB — I-STAT BETA HCG BLOOD, ED (MC, WL, AP ONLY)

## 2015-12-30 LAB — URINALYSIS, ROUTINE W REFLEX MICROSCOPIC
BILIRUBIN URINE: NEGATIVE
Glucose, UA: NEGATIVE mg/dL
HGB URINE DIPSTICK: NEGATIVE
Ketones, ur: NEGATIVE mg/dL
Leukocytes, UA: NEGATIVE
Nitrite: NEGATIVE
PH: 5.5 (ref 5.0–8.0)
Protein, ur: NEGATIVE mg/dL
SPECIFIC GRAVITY, URINE: 1.01 (ref 1.005–1.030)

## 2015-12-30 LAB — COMPREHENSIVE METABOLIC PANEL
ALBUMIN: 4.4 g/dL (ref 3.5–5.0)
ALT: 33 U/L (ref 14–54)
AST: 34 U/L (ref 15–41)
Alkaline Phosphatase: 100 U/L (ref 38–126)
Anion gap: 10 (ref 5–15)
BUN: 5 mg/dL — AB (ref 6–20)
CHLORIDE: 107 mmol/L (ref 101–111)
CO2: 22 mmol/L (ref 22–32)
Calcium: 9.2 mg/dL (ref 8.9–10.3)
Creatinine, Ser: 0.75 mg/dL (ref 0.44–1.00)
GFR calc Af Amer: >60 mL/min (ref >60)
GFR calc non Af Amer: >60 mL/min (ref >60)
GLUCOSE: 88 mg/dL (ref 65–99)
POTASSIUM: 3.3 mmol/L — AB (ref 3.5–5.1)
SODIUM: 139 mmol/L (ref 135–145)
Total Bilirubin: 0.5 mg/dL (ref 0.3–1.2)
Total Protein: 7.3 g/dL (ref 6.5–8.1)

## 2015-12-30 LAB — RAPID URINE DRUG SCREEN, HOSP PERFORMED
Amphetamines: NOT DETECTED
BARBITURATES: NOT DETECTED
Benzodiazepines: NOT DETECTED
Cocaine: NOT DETECTED
Opiates: NOT DETECTED
TETRAHYDROCANNABINOL: NOT DETECTED

## 2015-12-30 LAB — TROPONIN I
TROPONIN I: 0.52 ng/mL — AB (ref <0.031)
Troponin I: 0.53 ng/mL (ref <0.031)

## 2015-12-30 LAB — ETHANOL: Alcohol, Ethyl (B): <5 mg/dL (ref <5)

## 2015-12-30 LAB — LIPASE, BLOOD: LIPASE: 14 U/L (ref 11–51)

## 2015-12-30 NOTE — ED Notes (Signed)
Pt reports high bp today 170's systolic, pt has confirmed STD (syphyllis from health dept) and reports that she is "not thinking good".  Pt did take bp medication today.  Pt has not had any nerve or anxiety medication today.  Pt was transferred to [facility] for evaluation from [facility] for confusion.  Pt reports that she would like pain medication for chest pain, abdominal pain, throat pain, and back pain.  Pt reports that she has not been eating as well.

## 2015-12-30 NOTE — ED Notes (Signed)
CRITICAL VALUE ALERT  Critical value received:  Troponin 0.52 Date of notification:12/30/15 Time of notification:  1922 Critical value read back:Yes.    Nurse who received alert: GMP MD notified (1st page): Zackowski Time of first page:  1922 Responding MD: Deretha Emory Time MD responded:  [phone]

## 2015-12-30 NOTE — ED Notes (Signed)
MD at bedside. 

## 2015-12-30 NOTE — ED Notes (Signed)
In to talk with patient. Pt states she just needs something for her anxiety and to calm her down. 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 Daughter states that's all her mom needs

## 2015-12-30 NOTE — ED Provider Notes (Addendum)
CSN: [id number]     Arrival date & time 12/30/15  1627 History   First MD Initiated Contact with Patient 12/30/15 1646     Chief Complaint  Patient presents with  . V70.1     (Consider location/radiation/quality/duration/timing/severity/associated sxs/prior Treatment) The history is provided by the patient.   40 year old female seen quite frequently in the emergency department only for the complaint of chest pain. Patient initial presentation here today was for anxiety chest pain abdominal pain. In triage she also mentioned throat pain and back pain but denied that to me. Patient states not eating well. Family members pulled me aside and stated that she was really here because she tried to kill herself by cutting her left arm. Patient then told me that she is suicidal and that she did try to cut her arm. She wants to leave. Based on this and IVC was done. Patient also concerned about elevated blood pressure and also concerned about a positive RPR syphilis test at the health department yesterday. Past Medical History  Diagnosis Date  . Diabetes mellitus without complication (HCC)   . Hypertension   . Anxiety   . MI (myocardial infarction) (HCC) 10/2015    elevated troponin, likely due to vasospasm, clean coronary arteries on cath  . Chronic pelvic pain in female   . Chronic abdominal pain   . History of cardiac catheterization 11/06/2015    normal coronary arteries  . History of echocardiogram 11/06/2015    normal  . GERD (gastroesophageal reflux disease)   . Panic attack    Past Surgical History  Procedure Laterality Date  . Cesarean section    . Cardiac catheterization N/A 11/06/2015    Procedure: Left Heart Cath and Coronary Angiography;  Surgeon: Thurmon Fair, MD;  Location: [facility];  Service: Cardiovascular;  Laterality: N/A;   History reviewed. No pertinent family history. Social History  Substance Use Topics  . Smoking status: Current Every Day Smoker -- 0.50  packs/day for 5 years    Types: Cigarettes  . Smokeless tobacco: None  . Alcohol Use: No   OB History    Gravida Para Term Preterm AB TAB SAB Ectopic Multiple Living            2     Review of Systems  Constitutional: Positive for appetite change. Negative for fever.  HENT: Positive for congestion.   Respiratory: Negative for shortness of breath.   Cardiovascular: Positive for chest pain.  Gastrointestinal: Negative for abdominal pain.  Genitourinary: Negative for dysuria.  Musculoskeletal: Negative for back pain.  Neurological: Negative for headaches.  Hematological: Does not bruise/bleed easily.  Psychiatric/Behavioral: Positive for suicidal ideas. Negative for confusion. The patient is nervous/anxious.       Allergies  Ciprofloxacin; Penicillins; Amoxicillin; Codeine; Metronidazole; Sulfa antibiotics; and Toradol  Home Medications   Prior to Admission medications   Medication Sig Start Date End Date Taking? Authorizing Provider  clonazePAM (KLONOPIN) 0.5 MG tablet Take 1 tablet (0.5 mg total) by mouth 3 (three) times daily as needed for anxiety. 12/26/15  Yes Doug Sou, MD  hydrOXYzine (ATARAX/VISTARIL) 25 MG tablet Take 1 tablet (25 mg total) by mouth 3 (three) times daily as needed. As needed for anxiety 12/26/15  Yes Doug Sou, MD  metoprolol (LOPRESSOR) 50 MG tablet Take 0.5 tablets (25 mg total) by mouth 2 (two) times daily. 12/26/15  Yes Doug Sou, MD  mirtazapine (REMERON) 30 MG tablet Take 1 tablet (30 mg total) by mouth at bedtime. 12/26/15  Yes Doug Sou, MD  omeprazole (PRILOSEC OTC) 20 MG tablet Take 20 mg by mouth daily.   Yes Historical Provider, MD  albuterol (PROAIR HFA) 108 (90 Base) MCG/ACT inhaler Inhale 2 puffs into the lungs every 6 (six) hours as needed for wheezing or shortness of breath. Patient not taking: Reported on 12/30/2015 11/19/15   Sanjuana Kava, NP  azithromycin (ZITHROMAX) 250 MG tablet Take 1 tablet (250 mg total) by mouth daily.  Take 1 every day until finished. Patient not taking: Reported on 12/25/2015 12/21/15   Marily Memos, MD  beclomethasone (QVAR) 80 MCG/ACT inhaler Inhale 2 puffs into the lungs 2 (two) times daily.    Historical Provider, MD  gabapentin (NEURONTIN) 100 MG capsule Take 1 capsule (100 mg total) by mouth 3 (three) times daily. For agitation Patient not taking: Reported on 12/30/2015 11/19/15   Sanjuana Kava, NP  meloxicam (MOBIC) 7.5 MG tablet Take 1 tablet (7.5 mg total) by mouth daily. Patient not taking: Reported on 12/25/2015 12/21/15   Marily Memos, MD  ondansetron (ZOFRAN ODT) 4 MG disintegrating tablet Take 1 tablet (4 mg total) by mouth every 8 (eight) hours as needed for nausea. Patient not taking: Reported on 12/25/2015 12/23/15   Rolland Porter, MD  predniSONE (DELTASONE) 20 MG tablet Take 2 tablets (40 mg total) by mouth once. Patient not taking: Reported on 12/25/2015 12/22/15   Marily Memos, MD  traMADol (ULTRAM) 50 MG tablet Take 1 tablet (50 mg total) by mouth every 6 (six) hours as needed. Patient not taking: Reported on 12/25/2015 12/19/15   Bethann Berkshire, MD   BP 147/97 mmHg  Pulse 118  Temp(Src) 98.4 F (36.9 C) (Oral)  Ht  (1.626 m)  Wt 77.111 kg  BMI 29.17 kg/m2  SpO2 100%  LMP 12/23/2015 Physical Exam  Constitutional: She is oriented to person, place, and time. She appears well-developed and well-nourished. No distress.  HENT:  Head: Normocephalic and atraumatic.  Mouth/Throat: Oropharynx is clear and moist.  Eyes: Conjunctivae and EOM are normal. Pupils are equal, round, and reactive to light.  Neck: Normal range of motion. Neck supple.  Cardiovascular: Normal rate, regular rhythm and normal heart sounds.   No murmur heard. Pulmonary/Chest: Effort normal and breath sounds normal. No respiratory distress.  Abdominal: Soft. Bowel sounds are normal. There is no tenderness.  Musculoskeletal: Normal range of motion.  Left arm with several longitudinal superficial lacerations none  requiring repair. Radial pulses 2+ sensation to the fingers is intact sensations intact.  Neurological: She is alert and oriented to person, place, and time. No cranial nerve deficit. She exhibits normal muscle tone. Coordination normal.  Skin: Skin is warm.  Nursing note and vitals reviewed.   ED Course  Procedures (including critical care time) Labs Review Labs Reviewed  URINALYSIS, ROUTINE W REFLEX MICROSCOPIC (NOT AT [facility]) - Abnormal; Notable for the following:    Color, Urine STRAW (*)    All other components within normal limits  COMPREHENSIVE METABOLIC PANEL - Abnormal; Notable for the following:    Potassium 3.3 (*)    BUN 5 (*)    All other components within normal limits  CBC WITH DIFFERENTIAL/PLATELET - Abnormal; Notable for the following:    WBC 16.1 (*)    Neutro Abs 11.3 (*)    Monocytes Absolute 1.2 (*)    All other components within normal limits  TROPONIN I - Abnormal; Notable for the following:    Troponin I 0.52 (*)    All other  components within normal limits  TROPONIN I - Abnormal; Notable for the following:    Troponin I 0.53 (*)    All other components within normal limits  URINE RAPID DRUG SCREEN, HOSP PERFORMED  LIPASE, BLOOD  ETHANOL  RPR  I-STAT BETA HCG BLOOD, ED ([facility], [facility], [facility] ONLY)   Results for orders placed or performed during the hospital encounter of 12/30/15  Urinalysis, Routine w reflex microscopic (not at [facility])  Result Value Ref Range   Color, Urine STRAW (A) YELLOW   APPearance CLEAR CLEAR   Specific Gravity, Urine 1.010 1.005 - 1.030   pH 5.5 5.0 - 8.0   Glucose, UA NEGATIVE NEGATIVE mg/dL   Hgb urine dipstick NEGATIVE NEGATIVE   Bilirubin Urine NEGATIVE NEGATIVE   Ketones, ur NEGATIVE NEGATIVE mg/dL   Protein, ur NEGATIVE NEGATIVE mg/dL   Nitrite NEGATIVE NEGATIVE   Leukocytes, UA NEGATIVE NEGATIVE  Urine rapid drug screen (hosp performed)  Result Value Ref Range   Opiates NONE DETECTED NONE DETECTED   Cocaine NONE DETECTED NONE  DETECTED   Benzodiazepines NONE DETECTED NONE DETECTED   Amphetamines NONE DETECTED NONE DETECTED   Tetrahydrocannabinol NONE DETECTED NONE DETECTED   Barbiturates NONE DETECTED NONE DETECTED  Comprehensive metabolic panel  Result Value Ref Range   Sodium 139 135 - 145 mmol/L   Potassium 3.3 (L) 3.5 - 5.1 mmol/L   Chloride 107 101 - 111 mmol/L   CO2 22 22 - 32 mmol/L   Glucose, Bld 88 65 - 99 mg/dL   BUN 5 (L) 6 - 20 mg/dL   Creatinine, Ser 1.61 0.44 - 1.00 mg/dL   Calcium 9.2 8.9 - 09.6 mg/dL   Total Protein 7.3 6.5 - 8.1 g/dL   Albumin 4.4 3.5 - 5.0 g/dL   AST 34 15 - 41 U/L   ALT 33 14 - 54 U/L   Alkaline Phosphatase 100 38 - 126 U/L   Total Bilirubin 0.5 0.3 - 1.2 mg/dL   GFR calc non Af Amer >60 >60 mL/min   GFR calc Af Amer >60 >60 mL/min   Anion gap 10 5 - 15  Lipase, blood  Result Value Ref Range   Lipase 14 11 - 51 U/L  CBC with Differential/Platelet  Result Value Ref Range   WBC 16.1 (H) 4.0 - 10.5 K/uL   RBC 4.81 3.87 - 5.11 MIL/uL   Hemoglobin 14.7 12.0 - 15.0 g/dL   HCT 04.5 40.9 - 81.1 %   MCV 92.5 78.0 - 100.0 fL   MCH 30.6 26.0 - 34.0 pg   MCHC 33.0 30.0 - 36.0 g/dL   RDW 91.4 78.2 - 95.6 %   Platelets 279 150 - 400 K/uL   Neutrophils Relative % 70 %   Neutro Abs 11.3 (H) 1.7 - 7.7 K/uL   Lymphocytes Relative 22 %   Lymphs Abs 3.5 0.7 - 4.0 K/uL   Monocytes Relative 7 %   Monocytes Absolute 1.2 (H) 0.1 - 1.0 K/uL   Eosinophils Relative 1 %   Eosinophils Absolute 0.1 0.0 - 0.7 K/uL   Basophils Relative 0 %   Basophils Absolute 0.1 0.0 - 0.1 K/uL  Troponin I  Result Value Ref Range   Troponin I 0.52 (HH) <0.031 ng/mL  Ethanol  Result Value Ref Range   Alcohol, Ethyl (B) <5 <5 mg/dL  Troponin I  Result Value Ref Range   Troponin I 0.53 (HH) <0.031 ng/mL  I-Stat Beta hCG blood, ED ([facility], [facility], [facility] only)  Result Value  Ref Range   I-stat hCG, quantitative <5.0 <5 mIU/mL   Comment 3             Imaging Review No results found. I have personally  reviewed and evaluated these images and lab results as part of my medical decision-making.   EKG Interpretation   Date/Time:  Wednesday December 30 2015 16:45:14 EDT Ventricular Rate:  107 PR Interval:  132 QRS Duration: 90 QT Interval:  373 QTC Calculation: 498 R Axis:   -76 Text Interpretation:  Sinus tachycardia LAE, consider biatrial enlargement  Left anterior fascicular block Borderline prolonged QT interval No  significant change since last tracing Artifact Confirmed by Senora Lacson  MD,  Lanina Larranaga [id number]) on 12/30/2015 4:54:04 PM      MDM   Final diagnoses:  Suicidal ideation  Self-harm, initial encounter  Anxiety    Patient will be evaluated by behavioral health for the suicidal ideation and for the self cutting. IVC paperwork was completed on her.  Patient's labs she had 1 concern for positive RPR at health department yesterday has not in her system. Patient had positive RPR on April 19 here follow-up study showed a titer of 1-1 but no syphilis antibodies. Discussed with lab. They recommend repeating the RPR. That is still pending. The initial testing done was not confirmatory for syphilis at that time. Based on that patient will probably always have a reactive RPR and will require follow-up studies.  I edition patient's been seen multiple times for chest pain a common complaint for her. She has never ruled in for any significant cardiac event. Patient's troponins are always slightly elevated. Normally in the 0.45 2.49 range. Today's troponin was at 0.52. In the past cardiology has recommended that we just repeat the troponin if it climbs significantly then discuss it with them however they've always felt that they would not re-catheterize her. We will do a repeat troponin at 9:30.      Vanetta Mulders, MD 12/30/15 2059  Addendum: Repeat troponin at 3 hour mark without any significant change. Feel that this is not related to an acute cardiac event. As per previous  discussions.  Vanetta Mulders, MD 12/30/15 2240   Addendum: Patient's repeat RPR from yesterday was nonreactive. Not consistent with infection with syphilis.  Vanetta Mulders, MD 12/31/15 1547   Patient is medically cleared. Patient's mother is here requesting that she be transferred to [facility] have to have her talk with [facility]. This is out of our preview to arrange. Plan according to the behavioral health notes is that patient is to receive inpatient care. Placement was still pending as of this evening.  Vanetta Mulders, MD 12/31/15 2030

## 2015-12-30 NOTE — ED Notes (Signed)
Per Previous RN, IVC paperwork has been completed and faxed by unit Diplomatic Services operational officer.

## 2015-12-30 NOTE — ED Notes (Signed)
Lab at bedside
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
## 2015-12-31 ENCOUNTER — Emergency Department (HOSPITAL_COMMUNITY): Payer: Medicaid Other

## 2015-12-31 DIAGNOSIS — F332 Major depressive disorder, recurrent severe without psychotic features: Secondary | ICD-10-CM | POA: Diagnosis not present

## 2015-12-31 DIAGNOSIS — F419 Anxiety disorder, unspecified: Secondary | ICD-10-CM

## 2015-12-31 LAB — I-STAT TROPONIN, ED: TROPONIN I, POC: 0.03 ng/mL (ref 0.00–0.08)

## 2015-12-31 LAB — CBG MONITORING, ED: GLUCOSE-CAPILLARY: 83 mg/dL (ref 65–99)

## 2015-12-31 LAB — D-DIMER, QUANTITATIVE: D-Dimer, Quant: <0.27 ug/mL-FEU (ref 0.00–0.50)

## 2015-12-31 LAB — TROPONIN I: Troponin I: 0.52 ng/mL (ref <0.031)

## 2015-12-31 LAB — RPR: RPR: NONREACTIVE

## 2015-12-31 MED ORDER — MIRTAZAPINE 30 MG PO TABS
30.0000 mg | ORAL_TABLET | Freq: Every day | ORAL | Status: DC
  Filled 2015-12-31 (×4): qty 1

## 2015-12-31 MED ORDER — OMEPRAZOLE MAGNESIUM 20 MG PO TBEC: 20.0000 mg | DELAYED_RELEASE_TABLET | Freq: Every day | ORAL | Status: DC

## 2015-12-31 MED ORDER — SODIUM CHLORIDE 0.9 % IV BOLUS (SEPSIS): 1000.0000 mL | Freq: Once | INTRAVENOUS | Status: DC

## 2015-12-31 MED ORDER — BUDESONIDE 0.5 MG/2ML IN SUSP
0.5000 mg | Freq: Two times a day (BID) | RESPIRATORY_TRACT | Status: DC
  Filled 2015-12-31 (×9): qty 2

## 2015-12-31 MED ORDER — METOPROLOL TARTRATE 25 MG PO TABS
25.0000 mg | ORAL_TABLET | Freq: Two times a day (BID) | ORAL | Status: DC
  Administered 2015-12-31 – 2016-01-01 (×4): 25 mg via ORAL
  Filled 2015-12-31 (×4): qty 1

## 2015-12-31 MED ORDER — BECLOMETHASONE DIPROPIONATE 80 MCG/ACT IN AERS
2.0000 | INHALATION_SPRAY | Freq: Two times a day (BID) | RESPIRATORY_TRACT | Status: DC
  Filled 2015-12-31: qty 8.7

## 2015-12-31 MED ORDER — CLONAZEPAM 0.5 MG PO TABS
0.5000 mg | ORAL_TABLET | Freq: Three times a day (TID) | ORAL | Status: DC | PRN
  Administered 2015-12-31 – 2016-01-01 (×6): 0.5 mg via ORAL
  Filled 2015-12-31 (×7): qty 1

## 2015-12-31 MED ORDER — PANTOPRAZOLE SODIUM 40 MG PO TBEC
40.0000 mg | DELAYED_RELEASE_TABLET | Freq: Every day | ORAL | Status: DC
  Administered 2016-01-01: 40 mg via ORAL
  Filled 2015-12-31 (×2): qty 1

## 2015-12-31 MED ORDER — LORAZEPAM 1 MG PO TABS
1.0000 mg | ORAL_TABLET | Freq: Once | ORAL | Status: AC
  Administered 2015-12-31: 1 mg via ORAL
  Filled 2015-12-31 (×2): qty 1

## 2015-12-31 MED ORDER — ALBUTEROL SULFATE HFA 108 (90 BASE) MCG/ACT IN AERS: 2.0000 | INHALATION_SPRAY | Freq: Four times a day (QID) | RESPIRATORY_TRACT | Status: DC | PRN

## 2015-12-31 MED ORDER — GABAPENTIN 100 MG PO CAPS
100.0000 mg | ORAL_CAPSULE | Freq: Three times a day (TID) | ORAL | Status: DC
  Administered 2015-12-31 – 2016-01-01 (×4): 100 mg via ORAL
  Filled 2015-12-31 (×7): qty 1

## 2015-12-31 MED ORDER — LORAZEPAM 2 MG/ML IJ SOLN
1.0000 mg | Freq: Once | INTRAMUSCULAR | Status: AC
  Administered 2015-12-31: 1 mg via INTRAMUSCULAR
  Filled 2015-12-31: qty 1

## 2015-12-31 MED ORDER — MIRTAZAPINE 30 MG PO TABS
ORAL_TABLET | ORAL | Status: AC
  Filled 2015-12-31: qty 1
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
## 2015-12-31 NOTE — ED Notes (Signed)
Ativan IM administered. Pt informed that she will be transported to CT in 15 minutes

## 2015-12-31 NOTE — ED Notes (Signed)
Dr. Truitt Merle that pt was refusing all medications at this time

## 2015-12-31 NOTE — ED Notes (Signed)
MD at bedside. 

## 2015-12-31 NOTE — ED Notes (Signed)
Ct here to transport pt. Pt refused CT after several staff members try to talk pt into going for testing. Dr Manus Gunning notified. Pt states to come back in a few minutes

## 2015-12-31 NOTE — Progress Notes (Signed)
telepsych evaluation recommends pt is in need of inpatient treatment. Informed pt's mother- she requests that she be informed when pt transferred to an inpatient facility- pt has consented to providefamily information- Severiano Gilbert [phone]. Mother states she will call ED in order to coordinate bringing pt extra clothing and to visit her while awaiting placement.  Considered for admission to [facility], however there are no beds available currently.  Referred to: [facility]- per Vivien Rossetti- per [facility]- per Jonny Ruiz

## 2015-12-31 NOTE — Progress Notes (Signed)
Patient refused Pulmicort nebulizer treatment, stating that she felt sick. Patient was on room air,reclined and appeared in no respiratory distress.

## 2015-12-31 NOTE — ED Notes (Signed)
Pt is talking strangely, for example pt is saying she feels "backwards" and her mind is "backwards". "Mind feels closed in"  This nurse listened, although no advice given, just reassurance that pt is safe here.

## 2015-12-31 NOTE — ED Notes (Signed)
Pt resting with eyes open, appears to be in no distress. Respirations are even and unlabored.  

## 2015-12-31 NOTE — ED Notes (Signed)
Pt refusing IV fluids stating she will pull out the IV if i start it. Dr. Manus Gunning notified and told to give pt oral fluids, pt given 4 cups of water and told to drink water.  PT is now becoming anxious, wanting to go to the bathroom but is too paranoid to go according to sitter for pt.  Pt told to drink fluids.

## 2015-12-31 NOTE — ED Notes (Signed)
Behavioral health called requesting if pt would give permission for them to speak to family. Pt agreeable. BH informed

## 2015-12-31 NOTE — ED Notes (Signed)
Patient refused to have orthostatic vital signs done

## 2015-12-31 NOTE — ED Notes (Addendum)
Meal given and pt refused to eat meal.  Pt states "i am not hungry".

## 2015-12-31 NOTE — ED Notes (Signed)
Pt ambulated to the restroom with steady and even gait at this time.

## 2015-12-31 NOTE — ED Notes (Signed)
Pt refused to eat lunch

## 2015-12-31 NOTE — ED Notes (Signed)
Telepsych completed.  

## 2015-12-31 NOTE — ED Provider Notes (Signed)
Patient appears to have chronically elevated troponins in setting of negative cardiac catheterization in April 2017. Repeat troponin this morning is stable at 0.52. EKG is sinus tachycardia with right axis. D-dimer is negative. Interestingly, patient had negative Istat troponin on 12/26/15 but lab troponins are chronically elevated. Patient denies any alcohol abuse.  Patient refusing medications and refusing treatment. She is oriented to person and place.  This is a change from her behavior yesterday. We'll obtain CT head.  CT head is negative. Troponin shows chronic elevation consistent with previous. RPR pending from yesterday.  Dr. Deretha Emory coming on shift. He is familiar with patient from yesterday. He is aware of pending RPR.  Glynn Octave, MD 12/31/15 [phone]

## 2015-12-31 NOTE — ED Notes (Signed)
Pt given water at this time, denies need to use restroom.

## 2015-12-31 NOTE — Progress Notes (Signed)
Pt provided verbal consent for [facility] to contact family per ED. Contacted pt's mother Severiano Gilbert [phone]- pt lives with mother, pt's 38 yr old son, and pt's aunt.  States that pt was admitted to [facility] 10/2015, following brief hospitalization for MI. States she was dx with MDD and anxiety. No history of psychotic symptoms. Mother reports that in the weeks following pt's d/c and returning home, "She was doing okay for a few weeks, and then over the past couple of weeks she started falling apart. She seems confused, seems to look right through [place]. She says she can't sit still and paces, up all night, not sleeping or eating." Reports pt followed up with OP treatment through [facility], Dr. Rosalia Hammers. States she has been compliant with medications to mother's knowledge. States, "yesterday she tried to cut herself 4 times, she hid knives from [place]." Convinced pt to come to ED. Primary issue when admitted was chest pain, however family reported their concerns to providers in ED, leading to psychiatry team's involvement.   Mother states that pt has had several social stressors in last few months and even years, which family considers have been contributing to pt's behavioral health issues. Mother states, "Most recently, Giorgia told us the health department told her she tested positive for syphilis. This was last week. She hasn't followed up with them, she was extremely anxious about it." Mother asks that pt be tested if possible while in ED so that she can know if treatment is warranted. Notes that pt does not have hx to family's knowledge of confusion or psychotic symptoms, her mental health issues up until recently being primarily depression/anxiety. Notes pt "has claustrophobia and being in small rooms makes all this worse, so being in the hospital is hard for her even when we know she needs it."  Mother states she is pt's power of attorney. States she is available by phone and can come to ED to  visit pt and assist with plan of care as needed. Requests that she be informed prior to pt being admitted/transferred/D/c'd as case progresses. Mother aware pt is awaiting re-evaluation by psychiatry.  Ilean Skill, MSW, LCSW Clinical Social Work, Disposition  12/31/2015 [phone]

## 2015-12-31 NOTE — ED Notes (Signed)
When asking pt if she will take her medication she states "i dont know who i am anymore and i cant swallow".  Pt could not explain to nurse why she could not swallow.  Pt is not able to communicate with nurse about why she does not want to take her medication. Dr. Manus Gunning notified.

## 2015-12-31 NOTE — ED Notes (Signed)
Spoke with Dr Manus Gunning regarding refusal for CT. Gave order to give Ativan 1mg  IM and take to CT. Pt is IVC

## 2015-12-31 NOTE — ED Notes (Addendum)
Pt mother and POA Mitzi Davenport) came to visit pt and speak with me.  She is requesting that pt be transferred to [facility]. Dr Deretha Emory made aware and is speaking with pt mother at this time.

## 2016-01-01 ENCOUNTER — Ambulatory Visit: Payer: Medicaid Other | Admitting: Cardiology

## 2016-01-01 NOTE — BHH Counselor (Addendum)
Writer spoke w/ Warden/ranger at [facility]. Writer let Jerilynn Som know that Lucianne Muss has requested pt go to [facility]. Pt is under IVC by [facility]. Calvin reports he will contact [facility] to obtain IVC.  Evette Cristal, [place] Therapeutic Triage Specialist

## 2016-01-01 NOTE — ED Notes (Signed)
Attempted to call report, nurse unable due to an admission

## 2016-01-01 NOTE — ED Notes (Signed)
Pt resting with even respirations, called pt multiple times without response for pt to take medication Neurontin

## 2016-01-01 NOTE — BHH Counselor (Signed)
Patient has been accepted to [facility].  Accepting physician is Dr. Ardyth Harps.  Attending Physician will be Dr. Ardyth Harps.  Patient has been assigned to room 311-A, by [facility] Charge Nurse Blanchard.  Call report to [phone].  Representative/Transfer Coordinator is Jerilynn Som  [facility] ER Staff Gearldine Bienenstock, Nurse) made aware of admission. [facility] (Page, TTS) made aware of admission.   Patient can arrived anytime after 9pm.

## 2016-01-01 NOTE — ED Notes (Signed)
Called back to give report, spoke with Baxter Hire and number provided for her to call back for report.

## 2016-01-01 NOTE — Progress Notes (Signed)
Patient refused Pulmicort nebulizer treatment at this time.

## 2016-01-01 NOTE — BHH Counselor (Addendum)
Call back from [place]. Dr Ardyth Harps at [facility] has accepted pt. He will let TTS know bed number. Notified pt's RN Gearldine Bienenstock.  Evette Cristal, [place] Therapeutic Triage Specialist [phone]   Per Jerilynn Som at [facility], they are no longer taking pt for their behavioral health unit. Notified pt's RN Gearldine Bienenstock. Pt is also under review at several hospitals.  Evette Cristal, [place] Therapeutic Triage Specialist [phone]

## 2016-01-02 ENCOUNTER — Inpatient Hospital Stay
Admission: RE | Admit: 2016-01-02 | Discharge: 2016-01-10 | DRG: 885 | Disposition: A | Discharge status: Home / Self Care | Payer: Medicaid Other | Source: Intra-hospital | Attending: Psychiatry | Admitting: Psychiatry

## 2016-01-02 DIAGNOSIS — F332 Major depressive disorder, recurrent severe without psychotic features: Secondary | ICD-10-CM | POA: Diagnosis not present

## 2016-01-02 DIAGNOSIS — Z880 Allergy status to penicillin: Secondary | ICD-10-CM | POA: Diagnosis not present

## 2016-01-02 DIAGNOSIS — K219 Gastro-esophageal reflux disease without esophagitis: Secondary | ICD-10-CM | POA: Diagnosis present

## 2016-01-02 DIAGNOSIS — J45909 Unspecified asthma, uncomplicated: Secondary | ICD-10-CM | POA: Diagnosis present

## 2016-01-02 DIAGNOSIS — Z818 Family history of other mental and behavioral disorders: Secondary | ICD-10-CM | POA: Diagnosis present

## 2016-01-02 DIAGNOSIS — I214 Non-ST elevation (NSTEMI) myocardial infarction: Secondary | ICD-10-CM | POA: Diagnosis present

## 2016-01-02 DIAGNOSIS — I252 Old myocardial infarction: Secondary | ICD-10-CM | POA: Diagnosis not present

## 2016-01-02 DIAGNOSIS — Z560 Unemployment, unspecified: Secondary | ICD-10-CM | POA: Diagnosis not present

## 2016-01-02 DIAGNOSIS — F410 Panic disorder [episodic paroxysmal anxiety] without agoraphobia: Secondary | ICD-10-CM | POA: Diagnosis present

## 2016-01-02 DIAGNOSIS — Z882 Allergy status to sulfonamides status: Secondary | ICD-10-CM | POA: Diagnosis not present

## 2016-01-02 DIAGNOSIS — R4182 Altered mental status, unspecified: Secondary | ICD-10-CM

## 2016-01-02 DIAGNOSIS — E119 Type 2 diabetes mellitus without complications: Secondary | ICD-10-CM | POA: Diagnosis present

## 2016-01-02 DIAGNOSIS — Z888 Allergy status to other drugs, medicaments and biological substances status: Secondary | ICD-10-CM | POA: Diagnosis not present

## 2016-01-02 DIAGNOSIS — F17210 Nicotine dependence, cigarettes, uncomplicated: Secondary | ICD-10-CM | POA: Diagnosis present

## 2016-01-02 DIAGNOSIS — G4700 Insomnia, unspecified: Secondary | ICD-10-CM | POA: Diagnosis present

## 2016-01-02 DIAGNOSIS — Z79899 Other long term (current) drug therapy: Secondary | ICD-10-CM | POA: Diagnosis not present

## 2016-01-02 DIAGNOSIS — Z886 Allergy status to analgesic agent status: Secondary | ICD-10-CM | POA: Diagnosis not present

## 2016-01-02 DIAGNOSIS — R45851 Suicidal ideations: Secondary | ICD-10-CM | POA: Diagnosis present

## 2016-01-02 DIAGNOSIS — I10 Essential (primary) hypertension: Secondary | ICD-10-CM | POA: Diagnosis present

## 2016-01-02 HISTORY — DX: Depression, unspecified: F32.A

## 2016-01-02 HISTORY — DX: Major depressive disorder, single episode, unspecified: F32.9

## 2016-01-02 LAB — LIPID PANEL
CHOL/HDL RATIO: 4.5 ratio
Cholesterol: 135 mg/dL (ref 0–200)
HDL: 30 mg/dL — AB (ref >40)
LDL Cholesterol: 72 mg/dL (ref 0–99)
Triglycerides: 163 mg/dL — ABNORMAL HIGH (ref <150)
VLDL: 33 mg/dL (ref 0–40)

## 2016-01-02 LAB — VITAMIN B12: VITAMIN B 12: 385 pg/mL (ref 180–914)

## 2016-01-02 LAB — AMMONIA: Ammonia: 12 umol/L (ref 9–35)

## 2016-01-02 LAB — HEMOGLOBIN A1C: HEMOGLOBIN A1C: 5.1 % (ref 4.0–6.0)

## 2016-01-02 MED ORDER — PANTOPRAZOLE SODIUM 40 MG PO TBEC
40.0000 mg | DELAYED_RELEASE_TABLET | Freq: Every day | ORAL | Status: DC
  Administered 2016-01-02 – 2016-01-06 (×5): 40 mg via ORAL
  Filled 2016-01-02 (×7): qty 1

## 2016-01-02 MED ORDER — ALUM & MAG HYDROXIDE-SIMETH 200-200-20 MG/5ML PO SUSP
30.0000 mL | ORAL | Status: DC | PRN
  Administered 2016-01-02 – 2016-01-09 (×9): 30 mL via ORAL
  Filled 2016-01-02 (×10): qty 30

## 2016-01-02 MED ORDER — LOPERAMIDE HCL 2 MG PO CAPS
2.0000 mg | ORAL_CAPSULE | Freq: Four times a day (QID) | ORAL | Status: DC | PRN
  Administered 2016-01-02 – 2016-01-03 (×2): 2 mg via ORAL
  Filled 2016-01-02 (×2): qty 1

## 2016-01-02 MED ORDER — BUDESONIDE 0.5 MG/2ML IN SUSP
0.5000 mg | Freq: Two times a day (BID) | RESPIRATORY_TRACT | Status: DC
  Administered 2016-01-02 – 2016-01-09 (×10): 0.5 mg via RESPIRATORY_TRACT
  Filled 2016-01-02 (×12): qty 2

## 2016-01-02 MED ORDER — CLONAZEPAM 0.5 MG PO TABS
0.5000 mg | ORAL_TABLET | Freq: Three times a day (TID) | ORAL | Status: DC | PRN
  Administered 2016-01-02 – 2016-01-04 (×6): 0.5 mg via ORAL
  Filled 2016-01-02 (×8): qty 1

## 2016-01-02 MED ORDER — ALBUTEROL SULFATE (2.5 MG/3ML) 0.083% IN NEBU
3.0000 mL | INHALATION_SOLUTION | Freq: Four times a day (QID) | RESPIRATORY_TRACT | Status: DC | PRN
  Filled 2016-01-02: qty 3

## 2016-01-02 MED ORDER — DULOXETINE HCL 30 MG PO CPEP
30.0000 mg | ORAL_CAPSULE | Freq: Every day | ORAL | Status: DC
  Administered 2016-01-02 – 2016-01-08 (×7): 30 mg via ORAL
  Filled 2016-01-02 (×7): qty 1

## 2016-01-02 MED ORDER — MAGNESIUM HYDROXIDE 400 MG/5ML PO SUSP: 30.0000 mL | Freq: Every day | ORAL | Status: DC | PRN

## 2016-01-02 MED ORDER — BOOST / RESOURCE BREEZE PO LIQD
1.0000 | Freq: Three times a day (TID) | ORAL | Status: DC
Start: 2016-01-02 — End: 2016-01-06
  Administered 2016-01-03 – 2016-01-06 (×9): 1 via ORAL

## 2016-01-02 MED ORDER — ACETAMINOPHEN 325 MG PO TABS
650.0000 mg | ORAL_TABLET | Freq: Four times a day (QID) | ORAL | Status: DC | PRN
  Administered 2016-01-02 – 2016-01-09 (×9): 650 mg via ORAL
  Filled 2016-01-02 (×10): qty 2

## 2016-01-02 MED ORDER — METOPROLOL TARTRATE 25 MG PO TABS
25.0000 mg | ORAL_TABLET | Freq: Two times a day (BID) | ORAL | Status: DC
  Administered 2016-01-02 – 2016-01-10 (×17): 25 mg via ORAL
  Filled 2016-01-02 (×18): qty 1

## 2016-01-02 MED ORDER — HYDROXYZINE HCL 25 MG PO TABS
25.0000 mg | ORAL_TABLET | Freq: Three times a day (TID) | ORAL | Status: DC | PRN
  Administered 2016-01-02 – 2016-01-04 (×4): 25 mg via ORAL
  Filled 2016-01-02 (×5): qty 1

## 2016-01-02 MED ORDER — NICOTINE 14 MG/24HR TD PT24
14.0000 mg | MEDICATED_PATCH | Freq: Every day | TRANSDERMAL | Status: DC
  Administered 2016-01-02 – 2016-01-10 (×9): 14 mg via TRANSDERMAL
  Filled 2016-01-02 (×9): qty 1

## 2016-01-02 MED ORDER — TRAZODONE HCL 50 MG PO TABS
ORAL_TABLET | ORAL | Status: AC
  Administered 2016-01-02: 100 mg
  Filled 2016-01-02: qty 2

## 2016-01-02 MED ORDER — TRAZODONE HCL 100 MG PO TABS
100.0000 mg | ORAL_TABLET | Freq: Every evening | ORAL | Status: DC | PRN
  Administered 2016-01-02 – 2016-01-03 (×2): 100 mg via ORAL
  Filled 2016-01-02 (×2): qty 1

## 2016-01-02 MED ORDER — MIRTAZAPINE 15 MG PO TABS
30.0000 mg | ORAL_TABLET | Freq: Every day | ORAL | Status: DC
  Administered 2016-01-02 – 2016-01-04 (×3): 30 mg via ORAL
  Filled 2016-01-02 (×3): qty 2

## 2016-01-02 NOTE — BHH Suicide Risk Assessment (Signed)
[facility] Admission Suicide Risk Assessment   Nursing information obtained from:  Patient Demographic factors:  Divorced or widowed Current Mental Status:  NA Loss Factors:  Financial problems / change in socioeconomic status Historical Factors:  Family history of mental illness or substance abuse Risk Reduction Factors:  Responsible for children under 38 years of age  Total Time spent with patient: 1 hour Principal Problem: Diagnosis:  Major Depression  Patient Active Problem List   Diagnosis Date Noted  . Psychosis [F29] 01/02/2016  . MDD (major depressive disorder), recurrent severe, without psychosis (HCC) [F33.2] 11/15/2015  . NSTEMI (non-ST elevated myocardial infarction) (HCC) [I21.4] 11/06/2015  . Anxiety [F41.9] 11/06/2015  . Normal coronary arteries 11/06/15 [Z03.89] 11/06/2015  . ACS (acute coronary syndrome) (HCC) [I24.9] 11/05/2015  . Chest pain [R07.9] 11/05/2015   Subjective Data:   Melissa Butler is a 40 year old divorced Caucasian female who was transferred from the emergency room at [facility] to [facility] inpatient psychiatry for psychotropic medication management secondary to worsening problems with anxiety and possible suicidal thoughts. The patient initially presented to the emergency room after scratching her wrist with a knife. She was very vague about possible suicidal thoughts. She did appear to have some confusion in the emergency room but no delirium. The patient was unable to fully contract for safety in the emergency room. She denied any homicidal thoughts. She denies any auditory or visual hallucinations. No paranoid thoughts or delusions. She did have multiple somatic complaints including feeling like her body was on fire and that she was joking. The patient says she feels very claustrophobic and has to pace a lot to relieve anxiety. She feels like she has been having constant panic attack for days including chest pain and shortness of breath.  The patient denies any specific triggers for increased anxiety. She also reports problems with insomnia and increased anxiety over the past 3-4 months. She does also report feelings of hopelessness and anhedonia. She cut her left wrist superficially with a knife prior to coming to the emergency room but cannot identify any specific triggers. She denies any history of symptoms consistent with bipolar mania including grandiose delusions, decreased sleep with increased goal directed behavior, hyperreligious thoughts or hypersexual behavior. The patient denies any history of any psychotic symptoms including auditory or visual hallucinations. No paranoid thoughts or delusions. The patient currently lives with her mother and her own. She says she has been compliant with psychotropic medications including Remeron prior to admission. It is unclear when she stopped taking Cymbalta. The patient was very vague with her responses. The patient did have a positive RPR test in the past but then had a negative RPR test following. She is convinced that she does not have syphilis but at one point in April, she was extremely worried about syphilis diagnosis. She denies any history of any heavy alcohol use or    Continued Clinical Symptoms:  Alcohol Use Disorder Identification Test Final Score (AUDIT): 0 The "Alcohol Use Disorders Identification Test", Guidelines for Use in Primary Care, Second Edition.  World Science writer [facility]). Score between 0-7:  no or low risk or alcohol related problems. Score between 8-15:  moderate risk of alcohol related problems. Score between 16-19:  high risk of alcohol related problems. Score 20 or above:  warrants further diagnostic evaluation for alcohol dependence and treatment.   CLINICAL FACTORS:   Severe Anxiety and/or Agitation Panic Attacks Depression:   Anhedonia Hopelessness Severe   Musculoskeletal: Strength & Muscle Tone: within  normal limits Gait & Station:  normal Patient leans: N/A  Psychiatric Specialty Exam: Physical Exam  Review of Systems  Constitutional: Negative.  Negative for fever, chills, weight loss, malaise/fatigue and diaphoresis.  HENT: Negative.  Negative for ear discharge, ear pain, hearing loss, nosebleeds, sore throat and tinnitus.   Eyes: Negative.  Negative for blurred vision, double vision, photophobia and pain.  Respiratory: Negative.  Negative for cough, hemoptysis, sputum production, shortness of breath and wheezing.   Cardiovascular: Negative.  Negative for chest pain, palpitations, orthopnea, claudication and leg swelling.  Gastrointestinal: Negative.  Negative for heartburn, nausea, vomiting, abdominal pain, diarrhea, constipation and blood in stool.  Genitourinary: Negative.  Negative for dysuria, urgency and frequency.  Musculoskeletal: Negative.  Negative for myalgias, back pain, joint pain, falls and neck pain.  Skin: Negative.  Negative for itching and rash.  Neurological: Negative.  Negative for dizziness, tingling, tremors, sensory change, speech change, focal weakness, seizures, loss of consciousness, weakness and headaches.  Endo/Heme/Allergies: Negative.  Negative for polydipsia. Does not bruise/bleed easily.    Blood pressure 103/73, pulse 87, temperature 98.6 F (37 C), temperature source Oral, resp. rate 16, height  (1.651 m), weight 74.844 kg (165 lb), last menstrual period 12/23/2015, SpO2 99 %.Body mass index is 27.46 kg/(m^2).  General Appearance: Disheveled  Eye Contact:  Good  Speech:  Clear and Coherent and Normal Rate  Volume:  Normal  Mood:  Depressed and Dysphoric  Affect:  Flat  Thought Process:  Coherent and Linear  Orientation:  Full (Time, Place, and Person)  Thought Content:  Logical  Suicidal Thoughts:  No  Homicidal Thoughts:  No  Memory:  Immediate;   Fair Recent;   Fair Remote;   Fair  Judgement:  Fair  Insight:  Fair  Psychomotor Activity:  Normal  Concentration:   Concentration: Fair and Attention Span: Fair  Recall:  [employer] of Knowledge:  Fair  Language:  Good  Akathisia:  Negative  Handed:  Right  AIMS (if indicated):     Assets:  Communication Skills Housing Social Support  ADL's:  Intact  Cognition:  WNL  Sleep:  Number of Hours: 3.3      COGNITIVE FEATURES THAT CONTRIBUTE TO RISK:  None    SUICIDE RISK:   Minimal: No identifiable suicidal ideation.  Patients presenting with no risk factors but with morbid ruminations; may be classified as minimal risk based on the severity of the depressive symptoms  PLAN OF CARE:   DISPOSITION: Major depressive disorder, recurrent, severe, without psychotic features R/O Neurosyphilis Diabetes mellitus Hypertension History of MI GERD Moderate to severe: Unemployed, chronic mental illness   Melissa Butler is a 40 year old divorced Caucasian female who was brought to the emergency room by her family secondary to agitated behavior including scratching her wrist with a knife. The patient appeared to have some confusion in the emergency room. She has been reporting worsening depressive symptoms and increased anxiety over the past 3-4 months as well as multiple somatic complaints. She denies any history of symptoms consistent with bipolar mania. No psychosis.  Major depressive disorder, current, severe, without psychotic features: The patient stopped taking Cymbalta at some point within the past few months and cannot give a reason why. She does want to restart the medication to help with anxiety. Will restart Cymbalta 30 mg by mouth daily and plan to titrate up to 60 mg by mouth daily for anxiety and depression. We'll plan to continue Remeron 30 mg by mouth nightly for  depression, anxiety and increase appetite and sleep. Will continue trazodone 100 mg by mouth nightly as well for insomnia. The patient has when necessary Klonopin 0.5 mg by mouth 3 times a day as needed for anxiety and hydroxyzine 25 mg by  mouth 3 times a day when necessary for anxiety. She continues however to complain of increased anxiety.  Will continue supportive psychotherapy and she is patient some meditation and relaxation techniques to help with anxiety. R/O Neurosyphilis: The patient has a history of a positive RPR test in the past but then had a negative RPR test following. MRI of the brain scheduled to rule out any neurosyphilis. May need to consult ID.  Hypertension: We'll plan to continue metoprolol 25 mg by mouth twice a day for hypertension.  GERD: We'll plan to continue Protonix 40 mg by mouth daily  Asthma: Will plan to continue albuterol inhaler  Nicotine dependence: The patient will be offered a nicotine patch  She denies any access to guns  Disposition: The patient does have a stable living situation with her mom in [facility]. Psychotropic medication management follow-up appointment as well as individual therapy will need to be scheduled at [facility].   I certify that inpatient services furnished can reasonably be expected to improve the patient's condition.   Levora Angel, MD 01/02/2016, 2:23 PM

## 2016-01-02 NOTE — Progress Notes (Signed)
NUTRITION ASSESSMENT  Pt identified as at risk on the Malnutrition Screen Tool  INTERVENTION: 1. Encourage intake of food and beverages; reinforce importance of good nutrition 2. Supplements: Boost Breeze TID as pt previously supplementing with this; can change to [facility] if pt prefers  NUTRITION DIAGNOSIS: Unintentional weight loss related to sub-optimal intake as evidenced by pt report.   Goal: Pt to meet >/= 90% of their estimated nutrition needs.  Monitor:  PO intake  Assessment:   40 y.o. female admitted with MDD, recurrent, severe, without psychotic features  Height: Ht Readings from Last 1 Encounters:  01/02/16 5\' 5"  (1.651 m)    Weight: history of wt loss documented on 10/2015, 6% wt loss in 2 weeks; weight stable per recent wt encounters Wt Readings from Last 1 Encounters:  01/02/16 165 lb (74.844 kg)    Weight Hx:  Wt Readings from Last 10 Encounters:  01/02/16 165 lb (74.844 kg)  12/30/15 170 lb (77.111 kg)  12/23/15 170 lb (77.111 kg)  12/21/15 170 lb (77.111 kg)  12/19/15 170 lb (77.111 kg)  12/18/15 170 lb (77.111 kg)  12/08/15 170 lb (77.111 kg)  12/07/15 170 lb (77.111 kg)  12/05/15 170 lb (77.111 kg)  12/03/15 170 lb (77.111 kg)    BMI:  Body mass index is 27.46 kg/(m^2).   Estimated Nutritional Needs: Kcal: 25-30 kcal/kg Protein: > 1 gram protein/kg Fluid: 1 ml/kcal  Diet Order: Diet regular Room service appropriate?: Yes; Fluid consistency:: Thin Pt is also offered choice of unit snacks mid-morning and mid-afternoon.  Pt is eating as desired. Poor po intake/appetite, recorded po intake 0-25% of meals today  Lab results and medications reviewed.   Romelle Starcher MS, RD, LDN [phone] Pager  [phone] Weekend/On-Call Pager

## 2016-01-02 NOTE — Progress Notes (Signed)
D: Patient is a 40 y.o.  year-old female admitted to [facility] ambulatory without difficulty. Patient is alert and oriented upon admission. A: Admission assessment completed without difficulty. Skin and contraband assessment completed with Baxter Hire RN  with no skin abnormalities nor contraband found. Q.15 minute safety checks were implemented at the time of admission. Patient was oriented to the unit and escorted to room  311                                                            R: Patient was receptive to and cooperative with admission assessment. Patient contracts for safety on the unit at this time

## 2016-01-02 NOTE — Progress Notes (Signed)
D:  Per pt self inventory pt reports sleeping poor, appetite poor, energy level low, ability to pay attention poor, rates depression at an 8 out of 10, hopelessness at a 7 out of 10, anxiety at a 6 out of 10, denies SI/HI/AVH, goal today: "make it through the day, push myself harder", depressed/anxious during interaction, frequently requests anxiety medication.     A:  Emotional support provided, Encouraged pt to continue with treatment plan and attend all group activities, q15 min checks maintained for safety.  R:  Pt is not feeling as though the can go to groups today because of how anxious she is feeling, med compliant, otherwise pleasant and cooperative with staff and other patients on the unit.
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
## 2016-01-02 NOTE — BHH Counselor (Signed)
Adult Comprehensive Assessment  Patient ID: Melissa Butler, female DOB: 1976-07-09, 40 y.o. MRN: [id number]  Information Source: Information source: Patient  Current Stressors:  Educational / Learning stressors: None Employment / Job issues: None Family Relationships: Patient reports stress between herself and her mother. "We have a lot to deal with. We have to raise my son by ourselves" Financial / Lack of resources (include bankruptcy): Patient reports a lot of financial stress, as she is not currently working. "The only people bringing in income is mom and my aunt. Their on fixed incomes." Housing / Lack of housing: Patient currently lives with her mother and aunt Physical health (include injuries & life threatening diseases): Patient reports having a heart attack last Thursday. Has an appt with cardiologist on May 30th. Social relationships: Patient denies any stressors Substance abuse: Patient denies Bereavement / Loss: Patient denies  Living/Environment/Situation:  Living Arrangements: Parent, Other relatives Living conditions (as described by patient or guardian): Patient lives in a trailer with mother and aunt.  How long has patient lived in current situation?: 2 years  What is atmosphere in current home: Chaotic  Family History:  Marital status: Divorced Divorced, when?: 2014 What types of issues is patient dealing with in the relationship?: Patient reports that ex-husband was being unfaithful. Are you sexually active?: No Does patient have children?: Yes How many children?: 2 How is patient's relationship with their children?: Patient reports a positive relationship with her children.   Childhood History:  By whom was/is the patient raised?: Both parents Description of patient's relationship with caregiver when they were a child: Patient reports a good relationship with parents during childhood.  Patient's description of current relationship with people who raised  him/her: Patient reports a good relationship with mother. Father passed away when she was 89 years old from cancer. How were you disciplined when you got in trouble as a child/adolescent?: Mother Does patient have siblings?: Yes Number of Siblings: 1 Description of patient's current relationship with siblings: Patient reports that she does not talk to her sister.  Did patient suffer any verbal/emotional/physical/sexual abuse as a child?: No Did patient suffer from severe childhood neglect?: No Has patient ever been sexually abused/assaulted/raped as an adolescent or adult?: No Was the patient ever a victim of a crime or a disaster?: No Witnessed domestic violence?: No Has patient been effected by domestic violence as an adult?: No  Education:  Highest grade of school patient has completed: Automotive engineer Currently a Consulting civil engineer?: No Learning disability?: No  Employment/Work Situation:  Employment situation: Unemployed What is the longest time patient has a held a job?: 9 years  Where was the patient employed at that time?: 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" Has patient ever been in the [employer]?: No Has patient ever served in combat?: No Did You Receive Any Psychiatric Treatment/Services While in Equities trader?: No Are There Guns or Other Weapons in Your Home?: No Are These Comptroller?: (N/A)  Financial Resources:  Financial resources: No income Does patient have a Lawyer or guardian?: No  Alcohol/Substance Abuse:  What has been your use of drugs/alcohol within the last 12 months?: None If attempted suicide, did drugs/alcohol play a role in this?: No Alcohol/Substance Abuse Treatment Hx: Denies past history Has alcohol/substance abuse ever caused legal problems?: No  Social Support System:  Patient's Community Support System: Good Describe Community Support System: Patient reports her mother and aunt are supports for her. Type of  faith/religion: None How does patient's faith help to  cope with current illness?: N/A  Leisure/Recreation:  Leisure and Hobbies: Patient reports she enjoys cooking,cleaning, and hunting.   Strengths/Needs:  What things does the patient do well?: Singing  In what areas does patient struggle / problems for patient: Patient states "My nerves and procrastinating".  Discharge Plan:  Does patient have access to transportation?: Yes Will patient be returning to same living situation after discharge?: Yes Currently receiving community mental health services: Yes ([facility] in [place])  Does patient have financial barriers related to discharge medications?: Yes Patient description of barriers related to discharge medications: Patient does not have income at this time. May need assistance with getting medications upon discharge.   Summary/Recommendations:  Summary and Recommendations (to be completed by the evaluator): Patient is a 40 year old female with a diagnosis of Major Depressive Disorder . Pt presented to the hospital with feelings of worthlessness and depression. She reports she went to [facility] after her last admission to [facility]. She reports taking her medications but states "they must not be working because my head is not right."  Patient will benefit from crisis stabilization, medication evaluation, group therapy and psycho education in addition to case management for discharge planning. At discharge, it is recommended that Pt remain compliant with established discharge plan and continued treatment.   Daisy Floro Tyrene Nader MSW, [place]  01/02/2016

## 2016-01-02 NOTE — BHH Group Notes (Signed)
BHH LCSW Group Therapy  01/02/2016 2:39 PM  Type of Therapy: Group Therapy  Participation Level: Did Not Attend  Modes of Intervention: Discussion, Education, Socialization and Support  Summary of Progress/Problems: Pt will identify unhealthy thoughts and how they impact their emotions and behavior. Pt will be encouraged to discuss these thoughts, emotions and behaviors with the group.   Commodore Bellew L Sadiya Durand MSW, LCSWA  01/02/2016, 2:39 PM 

## 2016-01-02 NOTE — Progress Notes (Signed)
Called and spoke with on call MRI tech, notified them that patient has order for MRI, MRI is on call for STAT orders only, will have MRI done on Monday.

## 2016-01-02 NOTE — Tx Team (Signed)
Initial Interdisciplinary Treatment Plan   PATIENT STRESSORS:  Family Financial Her health   PATIENT STRENGTHS: Family support Children Optimistic about getting well   PROBLEM LIST: Problem List/Patient Goals  bipolar Date to be addressed 01/02/16 Date deferred Reason deferred Estimated date of resolution 01/09/16  anxiety 01/02/16   01/09/16  Depression  01/02/16   01/09/16                                             DISCHARGE CRITERIA:  When goals are met  PRELIMINARY DISCHARGE PLAN: Attend aftercare/continuing care group Outpatient therapy Return to previous living arrangement  PATIENT/FAMIILY INVOLVEMENT: This treatment plan has been presented to and reviewed with the patient, Melissa Butler, Melissa Butler patient and family have been given the opportunity to ask questions and make suggestions.  Hershal Coria 01/02/2016, 3:31 AM

## 2016-01-02 NOTE — Progress Notes (Signed)
Pt complaining of anxiety and racing thoughts, diarrhea, MD notified, see order management for new orders placed by MD.

## 2016-01-03 LAB — PROLACTIN: PROLACTIN: 10.9 ng/mL (ref 4.8–23.3)

## 2016-01-03 MED ORDER — CYANOCOBALAMIN 1000 MCG/ML IJ SOLN
1000.0000 ug | Freq: Once | INTRAMUSCULAR | Status: AC
  Administered 2016-01-03: 1000 ug via INTRAMUSCULAR
  Filled 2016-01-03: qty 1

## 2016-01-03 MED ORDER — VITAMIN B-12 1000 MCG PO TABS
1000.0000 ug | ORAL_TABLET | Freq: Every day | ORAL | Status: DC
  Administered 2016-01-03 – 2016-01-04 (×2): 1000 ug via ORAL
  Filled 2016-01-03 (×2): qty 1

## 2016-01-03 NOTE — Progress Notes (Signed)
Pt has been pleasant and cooperative on shift thus far. Pt denies SI and A/V hallucinations . Pt continues to be active on the unit interacting with both staff and peers appropriately.Pt has a lot of somatic complaints and has been anxious on shift .Pt's mood remains anxious and depressed. She appears to be in bed resting at this time.

## 2016-01-03 NOTE — Progress Notes (Signed)
Pt affect flat and depressed. Disheveled. Poor hygiene. Denies SI/AVH. Attended evening group. Requested PRN anxiety medications which were effective. Pt slept 8 hr. Voices no additional concerns. Will continue to monitor.

## 2016-01-03 NOTE — [facility] Group Notes (Signed)
[facility] Group Notes:  (Nursing/MHT/Case Management/Adjunct)  Date:  01/03/2016  Time:  5:22 AM  Type of Therapy:  Psychoeducational Skills  Participation Level:  Active  Participation Quality:  Appropriate  Affect:  Appropriate  Cognitive:  Appropriate  Insight:  Appropriate and Good  Engagement in Group:  Engaged  Modes of Intervention:  Discussion, Socialization and Support  Summary of Progress/Problems:  Melissa Butler 01/03/2016, 5:22 AM
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
## 2016-01-03 NOTE — [facility] Group Notes (Signed)
[facility] LCSW Group Therapy  01/03/2016 2:01 PM  Type of Therapy:  Group Therapy  Participation Level:  Did Not Attend   Modes of Intervention:  Discussion, Education, Socialization and Support  Summary of Progress/Problems: Self esteem: Patients discussed self esteem and how it impacts them. They discussed what aspects in their lives has influenced their self esteem. They were challenged to identify changes that are needed in order to improve self esteem.    Dayzee Trower L Ikran Patman MSW, LCSWA  01/03/2016, 2:01 PM   

## 2016-01-03 NOTE — Progress Notes (Signed)
Pt has been pleasant and cooperative.. Pt denies SI and A/V hallucinations . Pt continues to be active on the unit interacting with both staff and peers appropriately.Pt has exhibiting some med seeking behaviors.Pt's mood and affect has been depressed.

## 2016-01-04 ENCOUNTER — Inpatient Hospital Stay: Payer: Medicaid Other

## 2016-01-04 ENCOUNTER — Encounter: Payer: Self-pay | Admitting: Psychiatry

## 2016-01-04 DIAGNOSIS — F410 Panic disorder [episodic paroxysmal anxiety] without agoraphobia: Secondary | ICD-10-CM

## 2016-01-04 DIAGNOSIS — F332 Major depressive disorder, recurrent severe without psychotic features: Principal | ICD-10-CM

## 2016-01-04 MED ORDER — GADOBENATE DIMEGLUMINE 529 MG/ML IV SOLN
15.0000 mL | Freq: Once | INTRAVENOUS | Status: AC | PRN
  Administered 2016-01-04: 15 mL via INTRAVENOUS

## 2016-01-04 MED ORDER — RISPERIDONE 1 MG PO TABS: 0.5000 mg | ORAL_TABLET | Freq: Three times a day (TID) | ORAL | Status: DC

## 2016-01-04 MED ORDER — HYDROXYZINE HCL 50 MG PO TABS
50.0000 mg | ORAL_TABLET | Freq: Every evening | ORAL | Status: DC | PRN
  Administered 2016-01-04: 50 mg via ORAL
  Filled 2016-01-04: qty 1

## 2016-01-04 MED ORDER — CHLORDIAZEPOXIDE HCL 25 MG PO CAPS
50.0000 mg | ORAL_CAPSULE | Freq: Three times a day (TID) | ORAL | Status: DC
  Administered 2016-01-04 – 2016-01-06 (×6): 50 mg via ORAL
  Filled 2016-01-04 (×6): qty 2

## 2016-01-04 MED ORDER — LORAZEPAM 2 MG PO TABS
2.0000 mg | ORAL_TABLET | Freq: Two times a day (BID) | ORAL | Status: DC | PRN
  Administered 2016-01-04: 2 mg via ORAL
  Filled 2016-01-04: qty 1

## 2016-01-04 NOTE — [facility] Group Notes (Signed)
[facility] LCSW Group Therapy  01/04/2016 2:50 PM  Type of Therapy:  Group Therapy  Participation Level:  Active  Participation Quality:  Appropriate  Affect:  Appropriate  Cognitive:  Appropriate  Insight:  Developing/Improving  Engagement in Therapy:  Developing/Improving  Modes of Intervention:  Discussion, Education, Exploration and Support  Summary of Progress/Problems:LCSW reviewed group rules. This patient reported she was very anxious about MRI tests and going into small spaces. She reports she is claustrophobic. LCSW and peers practiced some deep breathing exercises. She was called away from group early. She was supportive towards her peers as they reflected their struggles.   Melissa Butler, [name] 01/04/2016, 2:50 PM

## 2016-01-04 NOTE — Progress Notes (Addendum)
Patient is anxious & worried all the time.Repeatedly stating that she is claustrophobic after she had the MRI.Verbalized that she don't feel hurting to herself but she don't feel good.Isolated in room most of the time.Attended groups.Compliant with medications.Appetite poor.

## 2016-01-04 NOTE — Progress Notes (Signed)
Recreation Therapy Notes  Date: 06.12.17 Time: 9:30 am Location: Craft Room  Group Topic: Self-expression  Goal Area(s) Addresses:  Patient will effectively use art as a means of self-expression. Patient will be able to identify one emotion experienced during group session.  Behavioral Response: Attentive, Interactive  Intervention: Two Faces of Me  Activity: Patients were given a blank worksheet and instructed to draw a line down the middle. On one side of the face, patients were instructed to draw or write how they were feeling when they were admitted to the hospital. On the other side of the face, patients were instructed to draw or write how they want to feel when they are d/c.  Education: LRT educated group on different forms on self-expression.  Education Outcome: Acknowledges education/In group clarification offered  Clinical Observations/Feedback: Patient completed activity by writing how she felt when she was admitted and how she wanted to feel when she is d/c. Patient contributed to group discussion by stating how she was feeling and how it affected her learning positive things and what emotions she felt during group.   Jacquelynn Cree, LRT/CTRS 01/04/2016 10:10 AM

## 2016-01-04 NOTE — [facility] Group Notes (Signed)
[facility] Group Notes:  (Nursing/MHT/Case Management/Adjunct)  Date:  01/04/2016  Time:  12:16 AM  Type of Therapy:  Group Therapy  Participation Level:  Minimal  Participation Quality:  Attentive  Affect:  Anxious and Flat  Cognitive:  Alert  Insight:  Appropriate  Engagement in Group:  Engaged  Modes of Intervention:  n/a  Summary of Progress/Problems:  Veva Holes 01/04/2016, 12:16 AM
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
## 2016-01-05 MED ORDER — MIRTAZAPINE 15 MG PO TABS
15.0000 mg | ORAL_TABLET | Freq: Every day | ORAL | Status: DC
  Administered 2016-01-05 – 2016-01-06 (×2): 15 mg via ORAL
  Filled 2016-01-05 (×2): qty 1

## 2016-01-05 MED ORDER — QUETIAPINE FUMARATE 100 MG PO TABS
100.0000 mg | ORAL_TABLET | Freq: Every day | ORAL | Status: DC
  Administered 2016-01-05: 100 mg via ORAL
  Filled 2016-01-05: qty 1

## 2016-01-05 NOTE — [facility] Group Notes (Signed)
[facility] LCSW Aftercare Discharge Planning Group Note  01/05/2016 11:04 AM  Participation Quality:  Appropriate  Affect:  Appropriate  Cognitive:  Appropriate  Insight:  Developing/Improving  Engagement in Group:  Developing/Improving  Modes of Intervention:  Discussion, Education, Exploration and Support  Summary of Progress/Problems:LCSW reviewed SMART goals with patients and explained the discharge process. Her goal for today to reduce her anxiety and meet with the psychiatrist. She reports she is very concerned that Doctor has removed her from her medications and is very anxious.  Johnella Moloney, Alyra Patty M 01/05/2016, 11:04 AM

## 2016-01-05 NOTE — Progress Notes (Signed)
D: Pt affect is anxious this evening. She continues to state that she feels claustrophobic and c/o anxiety rated 10/10. Pt requests PRN medication. Pt is intrusive at times regarding other patient's care. Denies SI/HI/AVH at this time.  A: Emotional support and encouragement provided. Medications administered with education. q15 minute safety checks maintained. R: Pt remains free from harm. Will continue to monitor.

## 2016-01-05 NOTE — Plan of Care (Signed)
Problem: Self-Concept: Goal: Level of anxiety will decrease Outcome: Not Progressing Pt affect is anxious this evening. She rates anxiety 10/10 and requests PRN medication.  Problem: Health Behavior/Discharge Planning: Goal: Ability to remain free from injury will improve Outcome: Progressing Pt remains free from harm.

## 2016-01-05 NOTE — Progress Notes (Signed)
Recreation Therapy Notes  Date: 06.13.17 Time: 9:30 am Location: Craft Room  Group Topic: Goal Setting  Goal Area(s) Addresses:  Patient will write at least one goal. Patient will write at least one obstacle.  Behavioral Response: Attentive, Left early  Intervention: Recovery Goal Chart  Activity: Patients were instructed to make a Recovery Goal Chart including goals, obstacles, date they started working on their goal, and date they achieved their goal.  Education: LRT educated patients on ways they can celebrate reaching their goals.  Education Outcome: Patient left before LRT educated group.  Clinical Observations/Feedback: Patient completed activity by writing goals and obstacles. Patient left group at approximately 9:45 am. Patient did not return to group.  Jacquelynn Cree, LRT/CTRS 01/05/2016 10:20 AM

## 2016-01-05 NOTE — [facility] Group Notes (Signed)
[facility] Group Notes:  (Nursing/MHT/Case Management/Adjunct)  Date:  01/05/2016  Time:  5:21 AM  Type of Therapy:  Psychoeducational Skills  Participation Level:  Active  Participation Quality:  Appropriate  Affect:  Appropriate  Cognitive:  Appropriate  Insight:  Appropriate and Good  Engagement in Group:  Engaged  Modes of Intervention:  Discussion, Socialization and Support  Summary of Progress/Problems:  Melissa Butler 01/05/2016, 5:21 AM

## 2016-01-05 NOTE — [facility] Group Notes (Signed)
[facility] Group Notes:  (Nursing/MHT/Case Management/Adjunct)  Date:  01/05/2016  Time:  10:28 PM  Type of Therapy:  Group Therapy  Participation Level:  Active  Participation Quality:  Appropriate  Affect:  Appropriate  Cognitive:  Appropriate  Insight:  Appropriate  Engagement in Group:  Engaged  Modes of Intervention:  Discussion  Summary of Progress/Problems:  Melissa Butler 01/05/2016, 10:28 PM

## 2016-01-05 NOTE — Plan of Care (Signed)
Problem: Activity: Goal: Interest or engagement in activities will improve Outcome: Progressing Attending unit programing    
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
## 2016-01-05 NOTE — [facility] Suicide Risk Assessment (Signed)
[facility] INPATIENT:  Family/Significant Other Suicide Prevention Education  Suicide Prevention Education:  Patient Refusal for Family/Significant Other Suicide Prevention Education: The patient Melissa Butler has refused to provide written consent for family/significant other to be provided Family/Significant Other Suicide Prevention Education during admission and/or prior to discharge. Patient was open to receive suicide education and CSW completed SPE with patient. Physician notified.  Lynden Oxford 01/05/2016, 4:06 PM

## 2016-01-05 NOTE — [facility] Group Notes (Signed)
[facility] LCSW Group Therapy  01/05/2016 3:11 PM  Type of Therapy:  Group Therapy  Participation Level:  Active  Participation Quality:  Appropriate  Affect:  Appropriate  Cognitive:  Appropriate  Insight:  Developing/Improving  Engagement in Therapy:  Developing/Improving  Modes of Intervention:  Discussion, Education, Exploration and Support  Summary of Progress/Problems:LCSW reviewed group rules and had patients introduce themselves and relay what emotions they struggle with. As a collective group patient reported anger, sadness, grief, loss and happiness. Each group member was asked to reflect on their own strongest emotion and then peers would contribute positive ways to reduce/deal their emotional issues. This patient identified anxiousnous and anger issues her biggest challenge. She questioned for the 3rd time today the doctors choice to remove her anxiety medications. LCSW redirected patient and reported that she looked more alert and seemed calm. Patient didn't know what to make of her peers comments but was able to remain for the entire group. LCSW supported patient several times in the group.  Johnella Moloney, Arina Torry M 01/05/2016, 3:11 PM

## 2016-01-05 NOTE — [facility] Group Notes (Signed)
[facility] Group Notes:  (Nursing/MHT/Case Management/Adjunct)  Date:  01/05/2016  Time:  2:09 PM  Type of Therapy:  Psychoeducational Skills  Participation Level:  Active  Participation Quality:  Resistant  Affect:  Appropriate and Tearful  Cognitive:  Appropriate  Insight:  Lacking  Engagement in Group:  Engaged and Limited  Modes of Intervention:  Discussion and Limit-setting  Summary of Progress/Problems:  Melissa Butler 01/05/2016, 2:09 PM

## 2016-01-05 NOTE — Progress Notes (Signed)
D: Patient observed  odorous this am . Voice concerns around not being able to get her medication. Stated she felt loopy this am  Stated lit was her brain .  Isolated to her room . Affect flat and suspicious. Voice of anxiety.  Refused breakfast.  Patient later  Melissa Butler down to  Dayroom and did eat breakfast.Patient remains  Very needy , requested staff to come to her room and talk to her . Patient constantly requested  Medication for her nerves. Patient  Anxious and and suspicious  . Attending unit programing. Compliant with medication .  A: Encourage  patient to come to staff  For any concerns. Received medication voice no concerns  R: Voice no other  Concerns , Staff continue to monitor

## 2016-01-05 NOTE — Plan of Care (Signed)
Problem: Coping: Goal: Ability to verbalize feelings will improve Outcome: Progressing Pt verbalizes feelings of anxiety and depression, both of which she rates 10/10.  Problem: Health Behavior/Discharge Planning: Goal: Compliance with therapeutic regimen will improve Outcome: Progressing Pt taking medications as prescribed.

## 2016-01-06 MED ORDER — PANTOPRAZOLE SODIUM 40 MG PO TBEC: 40.0000 mg | DELAYED_RELEASE_TABLET | Freq: Every day | ORAL | Status: DC

## 2016-01-06 MED ORDER — PANTOPRAZOLE SODIUM 40 MG PO TBEC
40.0000 mg | DELAYED_RELEASE_TABLET | Freq: Two times a day (BID) | ORAL | Status: DC
  Administered 2016-01-06 – 2016-01-08 (×4): 40 mg via ORAL
  Filled 2016-01-06 (×4): qty 1

## 2016-01-06 MED ORDER — CHLORDIAZEPOXIDE HCL 25 MG PO CAPS
25.0000 mg | ORAL_CAPSULE | Freq: Three times a day (TID) | ORAL | Status: DC
  Administered 2016-01-06 – 2016-01-09 (×9): 25 mg via ORAL
  Filled 2016-01-06 (×9): qty 1

## 2016-01-06 MED ORDER — QUETIAPINE FUMARATE 200 MG PO TABS
200.0000 mg | ORAL_TABLET | Freq: Every day | ORAL | Status: DC
  Administered 2016-01-06 – 2016-01-09 (×4): 200 mg via ORAL
  Filled 2016-01-06 (×4): qty 1

## 2016-01-06 NOTE — [facility] Group Notes (Signed)
[facility] LCSW Group Therapy  01/06/2016 2:57 PM  Type of Therapy:  Group Therapy  Participation Level:  Active  Participation Quality:  Attentive  Affect:  Flat  Cognitive:  Alert  Insight:  Limited  Engagement in Therapy:  Limited  Modes of Intervention:  Discussion, Education, Socialization and Support  Summary of Progress/Problems: Emotional Regulation: Patients will identify both negative and positive emotions. They will discuss emotions they have difficulty regulating and how they impact their lives. Patients will be asked to identify healthy coping skills to combat unhealthy reactions to negative emotions.  Pt attended group and stayed the entire time. She states she feels closed in and "not right." She was not able to elaborate what was "not right." She is worried about her medications. She states "I don't get any medications until 5:00, I don't know how I am going to make it."   [employer] MSW, LCSWA  01/06/2016, 2:57 PM

## 2016-01-06 NOTE — Progress Notes (Addendum)
D: Pt continues to display anxious behavior this evening. She states "I still feel like I'm closed in." Pt rates anxiety and depression 10/10. States that her gaol for today was "to make it through the day."  Pt c/o mouth pain and indigestion and requests PRN medication. A: Emotional support and encouragement provided. Medications administered with education. Pt encouraged to shower. q15 minute safety checks maintained. R: Pt remains free from harm. Will continue to monitor.
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
## 2016-01-06 NOTE — Progress Notes (Signed)
D: Patient continue to voice  Of  Not feeling well . Later said  She was feeling  Better.  Patient shower this am , but later had an odor again . Patient continued  To voice of her body being on fire . Attending unit programing . Limited interaction with peers and staff . Appetite good  Affect pleasant on approach  Denies suicidal  homicidal ideations  .  No auditory hallucinations  No pain concerns . Marland Kitchen  A: Encourage patient participation with unit programming . Instruction  Given on  Medication , verbalize understanding. R: Voice no other concerns. Staff continue to monitor
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
## 2016-01-06 NOTE — Consult Note (Signed)
  Psychological Assessment   Name: Melissa Butler Age: 40 Date of Evaluation: January 06, 2016 Test(s) Administered: Hessie Diener Gestalt  Trail Making Test: Parts A & B  Reason for Referral: [name] was referred for a psychological assessment by her physician, Radene Journey, MD.  She was admitted to Behavioral Medicine for treatment of confusion and anxiety. Collateral information suggests these difficulties have appeared over the last month. Her level of confusion has decreased while she has been in the hospital. Please see the history and physical and ongoing physician notes for further background information. A neuropsychological screening was requested.  Validity: [name] was pleasant and cooperative with the testing process. She attempted all tasks requested and appeared to try her best. There were brief periods of lack of concentration to the task. She verbalized an inability to recreate the designs during the [employer] administration. She attempted to start each test before the instructions were completely explained. The present evaluation is considered a valid indication of current functioning.  Results of Testing:  Trail Making Test: Part A- [name] completed the task in 37 seconds. Expected time to complete the task is 27 to 30 seconds. Her performance was in the expected range. Part B- [name] completed the task in 92 seconds. Expected time to completion is 66-85 seconds. She used verbal mediation to navigate through the task despite describing being lost in the process.  Her performance was in the mildly impaired range.   Bender Gestalt: [name] obtained 6 errors: Overlapping Difficulty, Simplification, Retrogression, Perseveration, Impotence, and Closure Difficulty. Six errors suggests some evidence of brain impairment.  Diagnostic Impression: [name] test results are consistent with borderline to mild evidence of brain impairment. As she is showing some improvement in  her level of confusion retesting and or a complete neuropsychological assessment in 6 - 12 months may be indicated if she does not continue to improve.

## 2016-01-06 NOTE — Progress Notes (Signed)
Recreation Therapy Notes  LRT met with patient after Dr. Jerilee Hoh requested LRT educated patient on relaxation techniques. At approximately 3:00 pm, LRT met with patient in patient room. LRT educated and provided patient with handouts on stress management techniques. LRT demonstrated the techniques with patient. LRT encouraged patient to continue practicing the stress management techniques. LRT will check in with patient tomorrow.  Leonette Monarch, LRT/CTRS 01/06/2016 3:35 PM

## 2016-01-06 NOTE — [facility] Group Notes (Signed)
[facility] Group Notes:  (Nursing/MHT/Case Management/Adjunct)  Date:  01/06/2016  Time:  10:43 PM  Type of Therapy:  Evening Wrap-up Group  Participation Level:  Minimal  Participation Quality:  Appropriate and Attentive  Affect:  Appropriate  Cognitive:  Alert and Appropriate  Insight:  Appropriate  Engagement in Group:  Developing/Improving  Modes of Intervention:  Discussion  Summary of Progress/Problems:  Tomasita Morrow 01/06/2016, 10:43 PM

## 2016-01-06 NOTE — Progress Notes (Signed)
Recreation Therapy Notes  Date: 06.14.17 Time: 9:30 am Location: Craft Room  Group Topic: Self-esteem  Goal Area(s) Addresses:  Patient will identify at least one positive trait about self. Patient will identify at least one healthy coping skill.  Behavioral Response: Attentive, Interactive  Intervention: All About Me  Activity: Patients were instructed to make a pamphlet including their life's motto, positive traits, healthy coping skills, and their support system.  Education: LRT educated patients on ways they can increase their self-esteem.  Education Outcome: Acknowledges education/In group clarification offered   Clinical Observations/Feedback: Patient completed activity by writing positive traits, coping skills, and her support system. Patient left group at approximately 9:55 am with nursing. Patient returned to group at approximately 10:00 am. Patient contributed to group discussion by stating why it can be difficult to think of positive traits.  Jacquelynn Cree, LRT/CTRS 01/06/2016 1:20 PM

## 2016-01-06 NOTE — Plan of Care (Signed)
Problem: Coping: Goal: Ability to cope will improve Outcome: Progressing Improving , information concrete

## 2016-01-06 NOTE — Social Work (Signed)
CSW spoke with [employer] in regards to linkage of Care Coordinator. Spoke with Adrienne Mocha (ph #: [phone]). Charlena Cross stated that next steps are in place - pt met needed criteria. CSW will be contacted in regards to next steps  Patient is a frequent utilizer of the ED and is in need of an ACT Team at this time.   Emilie Rutter, LCSW-A

## 2016-01-06 NOTE — [facility] Group Notes (Signed)
[facility] Group Notes:  (Nursing/MHT/Case Management/Adjunct)  Date:  01/06/2016  Time:  5:12 PM  Type of Therapy:  Psychoeducational Skills  Participation Level:  Active  Participation Quality:  Appropriate  Affect:  Appropriate  Cognitive:  Appropriate  Insight:  Appropriate  Engagement in Group:  Engaged  Modes of Intervention:  Discussion and Education  Summary of Progress/Problems:  Melissa Butler 01/06/2016, 5:12 PM

## 2016-01-07 MED ORDER — QUETIAPINE FUMARATE 25 MG PO TABS
25.0000 mg | ORAL_TABLET | Freq: Three times a day (TID) | ORAL | Status: DC
  Administered 2016-01-07 – 2016-01-10 (×8): 25 mg via ORAL
  Filled 2016-01-07 (×8): qty 1

## 2016-01-07 NOTE — [facility] Group Notes (Signed)
[facility] Group Notes:  (Nursing/MHT/Case Management/Adjunct)  Date:  01/07/2016  Time:  4:03 PM  Type of Therapy:  Movement Therapy  Participation Level:  Minimal  Participation Quality:  Attentive  Affect:  Appropriate  Cognitive:  Alert  Insight:  Improving  Engagement in Group:  Limited  Modes of Intervention:  Role-play and Socialization  Summary of Progress/Problems:  Gianni Fuchs De'Chelle Pearce Littlefield 01/07/2016, 4:03 PM

## 2016-01-07 NOTE — Progress Notes (Signed)
D: Patient is alert and confused this shift. Reports her mind is blank and she cannot concentrate on anything. Pt asked nurse why she cant remember anything.  Patient attended  group today. Patient denies SI, HI, AVH.  Scheduled meds g administered  as per MD orders. Emotional support and encouragement  provided. Patient receptive and remains safe on unit  q 15 minute safety checks.  R: No adverse medication reactions are noted. Patient is cooperative with medication administration and treatment plan today. Patient is highly anxious but cooperative on the unit at this time. Patient interacting with peers smiling, laughing and talking.

## 2016-01-07 NOTE — [facility] Group Notes (Signed)
[facility] Group Notes:  (Nursing/MHT/Case Management/Adjunct)  Date:  01/07/2016  Time:  10:53 PM  Type of Therapy:  Group Therapy  Participation Level:  Active  Participation Quality:  Attentive  Affect:  Appropriate  Cognitive:  Alert, Appropriate and Oriented  Insight:  Good  Engagement in Group:  Engaged  Modes of Intervention:  Discussion  Summary of Progress/Problems: Was off topic during group. Focused on medications and the names and purposes of them. Interrupted peers to talk and ask about medications.  Krysta Bloomfield Joy Brack Shaddock 01/07/2016, 10:53 PM

## 2016-01-07 NOTE — Plan of Care (Signed)
Problem: Coping: Goal: Ability to verbalize frustrations and anger appropriately will improve Outcome: Not Progressing Patient not able to verbalize frustrations due to high anxiety levels at this time [facility] RN

## 2016-01-07 NOTE — Progress Notes (Signed)
D: Patient is alert and oriented on the unit this shift. Patient attended and actively participated in groups today. Patient denies suicidal ideation, homicidal ideation, auditory or visual hallucinations at the present time.  A: Scheduled medications are administered to patient as per MD orders. Emotional support and encouragement are provided. Patient is maintained on q.15 minute safety checks. Patient is informed to notify staff with questions or concerns. R: No adverse medication reactions are noted. Patient is cooperative with medication administration and treatment plan today. Patient is highly anxious but cooperative on the unit at this time. Patient does not  Interact  with others on the unit this shift. Patient contracts for safety at this time. Patient remains safe at this time.

## 2016-01-07 NOTE — [facility] Group Notes (Signed)
[facility] LCSW Group Therapy   01/07/2016 1pm   Type of Therapy: Group Therapy   Participation Level: Active   Participation Quality: Attentive, Sharing and Supportive   Affect: Appropriate   Cognitive: Alert and Oriented   Insight: Developing/Improving and Engaged   Engagement in Therapy: Developing/Improving and Engaged   Modes of Intervention: Clarification, Confrontation, Discussion, Education, Exploration, Limit-setting, Orientation, Problem-solving, Rapport Building, Dance movement psychotherapist, Socialization and Support   Summary of Progress/Problems: The topic for group was balance in life. Today's group focused on defining balance in one's own words, identifying things that can knock one off balance, and exploring healthy ways to maintain balance in life. Group members were asked to provide an example of a time when they felt off balance, describe how they handled that situation, and process healthier ways to regain balance in the future. Group members were asked to share the most important tool for maintaining balance that they learned while at [facility] and how they plan to apply this method after discharge. Pt shared her difficulty with medication management and therapy and how this caused her to feel off-balance.  Pt shared that this and conflicts with family members have always made her feel off-balanced.  Pt shared she feels off balance currently.  Pt was polite and cooperative with the CSW and other group members and focused and attentive to the topics discussed and the sharing of others.  Dorothe Pea. Damarko Stitely, LCSWA, LCAS  01/07/16

## 2016-01-07 NOTE — Social Work (Signed)
CSW spoke with Cleo from [facility] (ph#:[phone]) who confirmed that paperwork that CSW faxed 01/05/16 was received and accepted. Patient was assigned to ACT Team at [facility], ACTT plans to meet with patient before D/C from [facility] and will begin seeing patient in home Monday, 01/11/16 @ 11am.    Lynden Oxford, LCSWA
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
## 2016-01-07 NOTE — Progress Notes (Signed)
Recreation Therapy Notes  Date: 06.15.17 Time: 9:30 am Location: Craft Room  Group Topic: Leisure Education  Goal Area(s) Addresses:  Patient will identify activities for each letter of the alphabet. Patient will verbalize ability to integrate positive leisure into life post d/c. Patient will verbalize ability to use leisure as a Associate Professor.  Behavioral Response: Attentive, Interactive  Intervention: Leisure Alphabet  Activity: Patients were given a Leisure Information systems manager and instructed to identify leisure activities for each letter of the alphabet.  Education: LRT educated patients on what they need to participate in leisure.  Education Outcome: In group clarification offered   Clinical Observations/Feedback: Patient wrote down leisure activities. Patient contributed to group discussion by stating some healthy leisure activities. Patient belched during group several times.  Jacquelynn Cree, LRT/CTRS 01/07/2016 10:29 AM

## 2016-01-07 NOTE — Plan of Care (Signed)
Problem: Education: Goal: Mental status will improve Outcome: Not Progressing Pt still confused

## 2016-01-07 NOTE — Progress Notes (Signed)
Recreation Therapy Notes  LRT met with patient this morning. Patient reported that she had been practicing the relaxation techniques. Patient reported she liked progressive muscle relaxation. When asked if the techniques were helpful, patient reported they were not. LRT encouraged patient to continue practicing the stress management techniques.  Leonette Monarch, LRT/CTRS 01/07/2016 4:54 PM

## 2016-01-08 MED ORDER — PANTOPRAZOLE SODIUM 40 MG PO TBEC
40.0000 mg | DELAYED_RELEASE_TABLET | Freq: Every day | ORAL | Status: DC
  Administered 2016-01-09 – 2016-01-10 (×2): 40 mg via ORAL
  Filled 2016-01-08 (×2): qty 1

## 2016-01-08 MED ORDER — DULOXETINE HCL 30 MG PO CPEP
60.0000 mg | ORAL_CAPSULE | Freq: Every day | ORAL | Status: DC
  Administered 2016-01-09 – 2016-01-10 (×2): 60 mg via ORAL
  Filled 2016-01-08 (×2): qty 2
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
## 2016-01-08 NOTE — Plan of Care (Signed)
Problem: Safety: Goal: Periods of time without injury will increase Outcome: Progressing Patient has remained free from harm.

## 2016-01-08 NOTE — [facility] Group Notes (Signed)
[facility] Group Notes:  (Nursing/MHT/Case Management/Adjunct)  Date:  01/08/2016  Time:  4:57 PM  Type of Therapy:  Psychoeducational Skills  Participation Level:  Minimal  Participation Quality:  Attentive  Affect:  Flat  Cognitive:  Appropriate  Insight:  Improving  Engagement in Group:  Limited  Modes of Intervention:  Discussion and Education  Summary of Progress/Problems:  Melissa Butler 01/08/2016, 4:57 PM

## 2016-01-08 NOTE — Progress Notes (Signed)
Patient has been to groups today , she has been talking to nurse, she is fixated on the names of all her medications, Nurse did give her a hand written list, patient states " I am not thinking right, I have no attention span" Patient is concerned about her memory. Patient states that her mom is supportive and she has one child. Patient denies Si/Hi and avh. Patient is safe, q 15 min. Checks. Patient is encouraged to keep taking medications and participate with activities.
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
## 2016-01-08 NOTE — [facility] Group Notes (Signed)
[facility] LCSW Group Therapy  01/08/2016 5:15 PM  Type of Therapy:  Group Therapy  Participation Level:  Minimal  Participation Quality:  Attentive  Affect:  Appropriate  Cognitive:  Alert  Insight:  Limited   Engagement in Therapy:  Limited   Modes of Intervention:  Discussion, Education, Socialization and Support  Summary of Progress/Problems: Feelings around Relapse. Group members discussed the meaning of relapse and shared personal stories of relapse, how it affected them and others, and how they perceived themselves during this time. Group members were encouraged to identify triggers, warning signs and coping skills used when facing the possibility of relapse. Social supports were discussed and explored in detail. Pt attended group and stayed the entire time. Pt sat quietly and listened to other group members share.    Melissa Butler L Faustina Gebert /MSW, LCSWA  01/08/2016, 5:15 PM    

## 2016-01-08 NOTE — Progress Notes (Signed)
D: Patient appears somewhat confused. She was very intent on taking her medication early and had to be told multiple times that she has to wait until 2100. She denies SI/HI/AVH. Says she's her because of the cuts on her wrist but states she doesn't really know why she did them. Rates pain in her mouth at a 9.  A: Medication given with education. Encouragement provided.  R: Patient was compliant with medication. She has remained calm and cooperative. Safety maintained with 15 min checks.

## 2016-01-08 NOTE — Progress Notes (Signed)
Recreation Therapy Notes  Date: 06.16.17 Time: 9:30 am Location: Craft Room  Group Topic: Coping Skills  Goal Area(s) Addresses:  Patient will participate in healthy coping skill. Patient will verbalize using art as a coping skill.  Behavioral Response: Attentive, Interactive  Intervention: Coloring  Activity: Patients were given coloring sheets to color and were instructed to think about what their mind was focus on and what emotions they felt.  Education: LRT educated patients on healthy coping skills.  Education Outcome: Acknowledges education/In group clarification offered  Clinical Observations/Feedback: Patient colored coloring sheet. Patient contributed to group discussion by stating how coloring is a good coping skills. Patient stated what emotions she felt and what her mind was focused on while coloring. Patient later stated she could not get out of her head.   Jacquelynn Cree, LRT/CTRS 01/08/2016 10:29 AM

## 2016-01-09 MED ORDER — HYDROXYZINE HCL 25 MG PO TABS
25.0000 mg | ORAL_TABLET | Freq: Four times a day (QID) | ORAL | Status: DC | PRN
  Administered 2016-01-09 (×2): 25 mg via ORAL
  Filled 2016-01-09 (×3): qty 1

## 2016-01-09 MED ORDER — METOPROLOL TARTRATE 25 MG PO TABS: 25.0000 mg | ORAL_TABLET | Freq: Two times a day (BID) | ORAL | Status: DC

## 2016-01-09 MED ORDER — CHLORDIAZEPOXIDE HCL 10 MG PO CAPS
10.0000 mg | ORAL_CAPSULE | Freq: Three times a day (TID) | ORAL | Status: DC
  Administered 2016-01-09 – 2016-01-10 (×3): 10 mg via ORAL
  Filled 2016-01-09 (×3): qty 1

## 2016-01-09 MED ORDER — DULOXETINE HCL 60 MG PO CPEP: 60.0000 mg | ORAL_CAPSULE | Freq: Every day | ORAL | Status: DC

## 2016-01-09 MED ORDER — QUETIAPINE FUMARATE 300 MG PO TABS: 300.0000 mg | ORAL_TABLET | Freq: Every day | ORAL | Status: DC

## 2016-01-09 MED ORDER — HYDROXYZINE HCL 25 MG PO TABS: 25.0000 mg | ORAL_TABLET | Freq: Four times a day (QID) | ORAL | Status: DC | PRN

## 2016-01-09 MED ORDER — PANTOPRAZOLE SODIUM 40 MG PO TBEC: 40.0000 mg | DELAYED_RELEASE_TABLET | Freq: Every day | ORAL | Status: DC

## 2016-01-09 NOTE — [facility] Group Notes (Signed)
[facility] LCSW Group Therapy  01/09/2016 2:28 PM  Type of Therapy:  Group Therapy  Participation Level:  Minimal  Participation Quality:  Inattentive  Affect:  Flat  Cognitive:  Alert  Insight:  Limited  Engagement in Therapy:  Limited  Modes of Intervention:  Discussion, Education, Socialization and Support  Summary of Progress/Problems: Pt will identify unhealthy thoughts and how they impact their emotions and behavior. Pt will be encouraged to discuss these thoughts, emotions and behaviors with the group. Pt attended group and stayed the entire time. Pt sat quietly and listened to other group members share.   [employer] MSW, LCSWA  01/09/2016, 2:28 PM

## 2016-01-09 NOTE — [facility] Suicide Risk Assessment (Signed)
[facility] Discharge Suicide Risk Assessment   Principal Problem: MDD (major depressive disorder), recurrent severe, without psychosis (HCC) Discharge Diagnoses:  Patient Active Problem List   Diagnosis Date Noted  . Panic disorder [F41.0] 01/04/2016  . MDD (major depressive disorder), recurrent severe, without psychosis (HCC) [F33.2] 11/15/2015  . NSTEMI (non-ST elevated myocardial infarction) (HCC) [I21.4] 11/06/2015  . Normal coronary arteries 11/06/15 [Z03.89] 11/06/2015      Psychiatric Specialty Exam: ROS  Blood pressure 134/74, pulse 117, temperature 98 F (36.7 C), temperature source Oral, resp. rate 18, height 5\' 5"  (1.651 m), weight 74.844 kg (165 lb), last menstrual period 12/23/2015, SpO2 99 %.Body mass index is 27.46 kg/(m^2).                                                       Mental Status Per Nursing Assessment::   On Admission:  NA  Demographic Factors:  Caucasian and Unemployed  Loss Factors: Decline in physical health  Historical Factors: Impulsivity  Risk Reduction Factors:   Responsible for children under 32 years of age and Positive social support  Continued Clinical Symptoms:  Previous Psychiatric Diagnoses and Treatments  Cognitive Features That Contribute To Risk:  Loss of executive function  improving  Suicide Risk:  Minimal: No identifiable suicidal ideation.  Patients presenting with no risk factors but with morbid ruminations; may be classified as minimal risk based on the severity of the depressive symptoms    Jimmy Footman, MD 01/10/2016, 7:27 AM

## 2016-01-09 NOTE — Plan of Care (Signed)
Problem: Coping: Goal: Ability to identify and develop effective coping behavior will improve Outcome: Progressing Pt talked about using deep breathing today to help reduce anxiety
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
## 2016-01-09 NOTE — [facility] Group Notes (Signed)
[facility] Group Notes:  (Nursing/MHT/Case Management/Adjunct)  Date:  01/09/2016  Time:  11:27 PM  Type of Therapy:  Psychoeducational Skills  Participation Level:  Active  Participation Quality:  Appropriate  Affect:  Appropriate  Cognitive:  Appropriate  Insight:  Appropriate and Good  Engagement in Group:  Engaged  Modes of Intervention:  Discussion, Socialization and Support  Summary of Progress/Problems:  Melissa Butler 01/09/2016, 11:27 PM

## 2016-01-09 NOTE — Progress Notes (Signed)
D: Observed pt . Patient alert and oriented x4. Patient denies SI/HI/AVH. Pt affect is anxious. Pt stated "My day was better." Pt talked about using deep breathing to help manage her anxiety today. Pt describer her mood as being "chill...tired." Pt rated depression 5/10 and anxiety 5/10. Pt continues to be very focused on her difficulty remembering things, especially in regards to medications. Pt malodorous. Pt needy at times. Pt c/o mouth pain.  A: Offered active listening and support. Provided therapeutic communication. Administered scheduled medications. Gave tylenol prn for pain. Encouraged pt to continue attending group. R: Pt pleasant and cooperative. Pt medication compliant. Will continue Q15 min. checks. Safety maintained.

## 2016-01-09 NOTE — [facility] Group Notes (Signed)
[facility] Group Notes:  (Nursing/MHT/Case Management/Adjunct)  Date:  01/09/2016  Time:  1:37 AM  Type of Therapy:  Psychoeducational Skills  Participation Level:  Active  Participation Quality:  Appropriate and Attentive  Affect:  Appropriate  Cognitive:  Alert, Appropriate and Oriented  Insight:  Good  Engagement in Group:  Engaged  Modes of Intervention:  Discussion and Exploration  Summary of Progress/Problems:  Melissa Butler 01/09/2016, 1:37 AM

## 2016-01-09 NOTE — Progress Notes (Signed)
Patient ID: Melissa Butler, female   DOB: 1976/04/18, 40 y.o.   MRN: [id number]    D: Pt has been very anxious on the unit today. Pt has also had some thought blocking, and spent most of the day asking the same question repeatedly. This writer helped the patient with a medication list, as she could not remember what she was taking from one minute to another. Pt would get upset because she could not remember, even with the medication list patient could not remember. Pt reported that her depression was a 6, her hopelessness was a 6, and her anxiety was a 6. Pt reported that her goal for today was to work hard on going home. Pt reported being negative SI/HI, no AH/VH noted. A: 15 min checks continued for patient safety. R: Pt safety maintained.

## 2016-01-10 MED ORDER — BUDESONIDE 0.5 MG/2ML IN SUSP: 0.5000 mg | Freq: Two times a day (BID) | RESPIRATORY_TRACT | Status: DC | PRN

## 2016-01-10 NOTE — Plan of Care (Signed)
Problem: Education: Goal: Knowledge of the prescribed therapeutic regimen will improve Outcome: Not Progressing Pt continues to regularly ask what medications she is taking and at what times repeatedly

## 2016-01-10 NOTE — Progress Notes (Signed)
Patient ID: Melissa Butler, female   DOB: 04/26/1976, 40 y.o.   MRN: [id number]  Pt was discharged home with her mother. Pt remained very confused at discharge, Dr. Ardyth Harps aware. Discharge instruction were given to patient and mother. Pts mother made aware that patient remains very confused regarding her medication, and that she may need help with medication regimen. Pt initially reported that she was not ready for discharge, yet called her mother to come pick her up. Pt reported being negative SI/HI, no AH/VH noted.

## 2016-01-10 NOTE — Progress Notes (Signed)
  [facility] Adult Case Management Discharge Plan :  Will you be returning to the same living situation after discharge:  Yes,  home  At discharge, do you have transportation home?: Yes,  family.  Do you have the ability to pay for your medications: Yes,  [facility]  Release of information consent forms completed and in the chart;  Patient's signature needed at discharge.  Patient to Follow up at: Follow-up Information    Follow up with [facility].   Why:  They will visit you either Monday or Tuesday. If you have any questions please give them a call.    Contact information:   [address]  [place], [place] [address] Phone: [phone] Fax: [phone]      Next level of care provider has access to [facility] Link:no  Safety Planning and Suicide Prevention discussed: Yes,  with patient   Have you used any form of tobacco in the last 30 days? (Cigarettes, Smokeless Tobacco, Cigars, and/or Pipes): Yes  Has patient been referred to the Quitline?: Patient refused referral  Patient has been referred for addiction treatment: N/A  Rondall Allegra MSW, LCSWA  01/10/2016, 10:06 AM

## 2016-01-10 NOTE — Progress Notes (Signed)
D: Pt approached writer asking about medications. Patient alert and oriented x4. Patient denies SI/HI/AVH. Pt affect is anxious. Pt stated "It's been a rough day...just ready to go home."Pt rated depression 7/10 and anxiety 7/10. Pt continues to be very focused on her difficulty remembering things, especially in regards to medications. Pt had to ask me what medications she was taking and at what times, and tried to repeat all medications back, unsuccessfully. Pt needy at times. Pt asked for vistaril by name, for anxiety A: Offered active listening and support. Provided therapeutic communication. Administered scheduled medications. Encouraged pt to continue attending group. Gave vistaril prn for pain. R: Pt pleasant and cooperative. Pt medication compliant. Will continue Q15 min. checks. Safety maintained.

## 2016-01-20 ENCOUNTER — Emergency Department (HOSPITAL_COMMUNITY)
Admission: EM | Admit: 2016-01-20 | Discharge: 2016-01-23 | Disposition: A | Discharge status: Other Acute Inpatient Hospital | Payer: Medicaid Other | Attending: Emergency Medicine | Admitting: Emergency Medicine

## 2016-01-20 ENCOUNTER — Encounter (HOSPITAL_COMMUNITY): Payer: Self-pay

## 2016-01-20 DIAGNOSIS — I252 Old myocardial infarction: Secondary | ICD-10-CM | POA: Diagnosis not present

## 2016-01-20 DIAGNOSIS — R45851 Suicidal ideations: Secondary | ICD-10-CM | POA: Insufficient documentation

## 2016-01-20 DIAGNOSIS — F17210 Nicotine dependence, cigarettes, uncomplicated: Secondary | ICD-10-CM | POA: Insufficient documentation

## 2016-01-20 DIAGNOSIS — F419 Anxiety disorder, unspecified: Secondary | ICD-10-CM

## 2016-01-20 DIAGNOSIS — Z79899 Other long term (current) drug therapy: Secondary | ICD-10-CM | POA: Diagnosis not present

## 2016-01-20 DIAGNOSIS — Z046 Encounter for general psychiatric examination, requested by authority: Secondary | ICD-10-CM | POA: Diagnosis present

## 2016-01-20 DIAGNOSIS — F418 Other specified anxiety disorders: Secondary | ICD-10-CM | POA: Insufficient documentation

## 2016-01-20 DIAGNOSIS — I10 Essential (primary) hypertension: Secondary | ICD-10-CM | POA: Diagnosis not present

## 2016-01-20 DIAGNOSIS — F32A Depression, unspecified: Secondary | ICD-10-CM

## 2016-01-20 DIAGNOSIS — F329 Major depressive disorder, single episode, unspecified: Secondary | ICD-10-CM

## 2016-01-20 DIAGNOSIS — F99 Mental disorder, not otherwise specified: Secondary | ICD-10-CM | POA: Insufficient documentation

## 2016-01-20 NOTE — ED Notes (Signed)
Pt spoke with Almira Coaster, RN [facility] who has provided pt with ice chips per her request

## 2016-01-20 NOTE — ED Notes (Signed)
Via [facility] IVC papers. Patient states she is out of her psych medications X1 day. Patient state she does have a history of self harm, denies SI/HI at this time. Denies AVH.

## 2016-01-20 NOTE — ED Notes (Signed)
Pt asks repeatedly for meds, as well as asking this nurse to stay with her as she is scared per her report. She is reminded that she has a Comptroller in view as well as a Chief Financial Officer. She again request meds and is again informed that physician will determine her meds and will evaluate her as soon as possible

## 2016-01-20 NOTE — ED Provider Notes (Signed)
CSN: [id number]     Arrival date & time 01/20/16  2151 History  By signing my name below, I, Melissa Butler, attest that this documentation has been prepared under the direction and in the presence of Melissa Albe, MD at 00:10 AM.  Electronically signed, Melissa Butler, ED Scribe. 01/21/2016. 2:38 AM.   Chief Complaint  Patient presents with  . V70.1   The history is provided by the patient. No language interpreter was used.   HPI Comments: Melissa Butler is a 40 y.o. female with a PMHx of HTN, Anxiety, depression, MI, GERD who presents to the Emergency Department after her mother filled out an IVC paperwork. Per mother's IVC paperwork, Pt has a Hx of cutting herself and in the last three days her family members have taken disposable razors from her that she had hidden in the waistband of her clothing. The family also states they feel as though she is suicidal. Pt denied hidng the razors or cutting herself recently. Pt states she has not taken any of her medications since this morning. Pt states that her mom is in charge of giving her medication but did not clarify the reasoning. Pt denies any Hx of overdosing on medication. Pt is supposed to see her PCP and does not have a follow up appointment. Pt was admitted at [facility] for psych problems but was released from the hospital one week ago. Pt has had a Hx of SI but denies any SI or HI recently. Patient initially tried to imply that her mother found a razor blades before she was admitted at [facility] however she then stated that they found the razors in her waistband yesterday. Her mother is concerned that she wants to kill her self however patient states "I don't want to hurt myself". Patient keeps repeating over and over again that she needs her Seroquel.  Melissa Butler  Past Medical History  Diagnosis Date  . Hypertension   . Anxiety   . MI (myocardial infarction) (HCC) 10/2015    elevated troponin, likely due to vasospasm, clean coronary  arteries on cath  . Chronic pelvic pain in female   . Chronic abdominal pain   . History of cardiac catheterization 11/06/2015    normal coronary arteries  . History of echocardiogram 11/06/2015    normal  . GERD (gastroesophageal reflux disease)   . Panic attack   . Depression    Past Surgical History  Procedure Laterality Date  . Cesarean section    . Cardiac catheterization N/A 11/06/2015    Procedure: Left Heart Cath and Coronary Angiography;  Surgeon: Thurmon Fair, MD;  Location: [facility] INVASIVE CV LAB;  Service: Cardiovascular;  Laterality: N/A;  . No past surgeries     History reviewed. No pertinent family history. Social History  Substance Use Topics  . Smoking status: Current Every Day Smoker -- 0.50 packs/day for 5 years    Types: Cigarettes  . Smokeless tobacco: None  . Alcohol Use: No  lives with mother  OB History    Gravida Para Term Preterm AB TAB SAB Ectopic Multiple Living            2     Review of Systems  Unable to perform ROS: Psychiatric disorder  All other systems reviewed and are negative.     Allergies  Ciprofloxacin; Penicillins; Amoxicillin; Codeine; Metronidazole; Sulfa antibiotics; and Toradol  Home Medications   Prior to Admission medications   Medication Sig Start Date End Date Taking? Authorizing Provider  albuterol (PROAIR HFA) 108 (90 Base) MCG/ACT inhaler Inhale 2 puffs into the lungs every 6 (six) hours as needed for wheezing or shortness of breath. Patient not taking: Reported on 12/30/2015 11/19/15   Sanjuana Kava, NP  beclomethasone (QVAR) 80 MCG/ACT inhaler Inhale 2 puffs into the lungs 2 (two) times daily.    Historical Provider, MD  DULoxetine (CYMBALTA) 60 MG capsule Take 1 capsule (60 mg total) by mouth daily. 01/09/16   Jimmy Footman, MD  hydrOXYzine (ATARAX/VISTARIL) 25 MG tablet Take 1 tablet (25 mg total) by mouth every 6 (six) hours as needed for anxiety. 01/09/16   Jimmy Footman, MD  metoprolol tartrate  (LOPRESSOR) 25 MG tablet Take 1 tablet (25 mg total) by mouth 2 (two) times daily. 01/09/16   Jimmy Footman, MD  pantoprazole (PROTONIX) 40 MG tablet Take 1 tablet (40 mg total) by mouth daily. 01/09/16   Jimmy Footman, MD  QUEtiapine (SEROQUEL) 300 MG tablet Take 1 tablet (300 mg total) by mouth at bedtime. 01/09/16   Jimmy Footman, MD   BP 141/89 mmHg  Pulse 114  Temp(Src) 98 F (36.7 C) (Oral)  Resp 18  Ht 5\' 7"  (1.702 m)  Wt 170 lb (77.111 kg)  BMI 26.62 kg/m2  SpO2 100%  LMP 01/18/2016  Vital signs normal except for tachycardia.   Physical Exam  Constitutional: She is oriented to person, place, and time. She appears well-developed and well-nourished.  Non-toxic appearance. She does not appear ill. No distress.  Oral mucous membranes are pale  Poor dentition.   HENT:  Head: Normocephalic and atraumatic.  Right Ear: External ear normal.  Left Ear: External ear normal.  Nose: Nose normal. No mucosal edema or rhinorrhea.  Mouth/Throat: Oropharynx is clear and moist and mucous membranes are normal. No dental abscesses or uvula swelling.  Eyes: Conjunctivae and EOM are normal. Pupils are equal, round, and reactive to light.  Neck: Normal range of motion and full passive range of motion without pain. Neck supple.  Cardiovascular: Normal rate, regular rhythm and normal heart sounds.  Exam reveals no gallop and no friction rub.   No murmur heard. Pulmonary/Chest: Effort normal and breath sounds normal. No respiratory distress. She has no wheezes. She has no rhonchi. She has no rales. She exhibits no tenderness and no crepitus.  Abdominal: Soft. Normal appearance and bowel sounds are normal. She exhibits no distension. There is no tenderness. There is no rebound and no guarding.  Musculoskeletal: Normal range of motion. She exhibits no edema or tenderness.  Moves all extremities well.   Neurological: She is alert and oriented to person, place, and time.  She has normal strength. No cranial nerve deficit.  Skin: Skin is warm, dry and intact. No rash noted. No erythema. No pallor.  Psychiatric: Her mood appears anxious. Her speech is rapid and/or pressured. She is agitated.  Nursing note and vitals reviewed.   ED Course  Procedures   Medications  potassium chloride SA (K-DUR,KLOR-CON) CR tablet 40 mEq (40 mEq Oral Given 01/21/16 0351)  acetaminophen (TYLENOL) tablet 650 mg (not administered)  zolpidem (AMBIEN) tablet 10 mg (10 mg Oral Given 01/21/16 0442)  nicotine (NICODERM CQ - dosed in mg/24 hours) patch 21 mg (not administered)  ondansetron (ZOFRAN) tablet 4 mg (not administered)  alum & mag hydroxide-simeth (MAALOX/MYLANTA) 200-200-20 MG/5ML suspension 30 mL (not administered)  DULoxetine (CYMBALTA) DR capsule 60 mg (not administered)  hydrOXYzine (ATARAX/VISTARIL) tablet 25 mg (25 mg Oral Given 01/21/16 0352)  metoprolol tartrate (LOPRESSOR) tablet  25 mg (25 mg Oral Given 01/21/16 0352)  pantoprazole (PROTONIX) EC tablet 40 mg (not administered)  QUEtiapine (SEROQUEL) tablet 300 mg (not administered)    DIAGNOSTIC STUDIES: Oxygen Saturation is 98% on RA, normal by my interpretation.  COORDINATION OF CARE: 12:16 AM-Will order blood work. Discussed treatment plan with pt at bedside and pt agreed to plan.   I did not notice that nursing staff did not start the psych protocol for lab testing. That was done at 02:25 in the morning.  2:30 AM- Finished filling out second opinion commitment papers.  03:25 AM After reviewing patient's laboratory results she was started on potassium 40 mEq twice a day for 4 doses.  07:00AM waiting for TSS consult.   Labs Review Results for orders placed or performed during the hospital encounter of 01/20/16  Comprehensive metabolic panel  Result Value Ref Range   Sodium 139 135 - 145 mmol/L   Potassium 3.0 (L) 3.5 - 5.1 mmol/L   Chloride 104 101 - 111 mmol/L   CO2 24 22 - 32 mmol/L   Glucose, Bld  101 (H) 65 - 99 mg/dL   BUN 4 (L) 6 - 20 mg/dL   Creatinine, Ser 1.96 0.44 - 1.00 mg/dL   Calcium 9.3 8.9 - 22.2 mg/dL   Total Protein 7.0 6.5 - 8.1 g/dL   Albumin 4.3 3.5 - 5.0 g/dL   AST 28 15 - 41 U/L   ALT 22 14 - 54 U/L   Alkaline Phosphatase 95 38 - 126 U/L   Total Bilirubin 0.8 0.3 - 1.2 mg/dL   GFR calc non Af Amer >60 >60 mL/min   GFR calc Af Amer >60 >60 mL/min   Anion gap 11 5 - 15  Ethanol  Result Value Ref Range   Alcohol, Ethyl (B) <5 <5 mg/dL  Acetaminophen level  Result Value Ref Range   Acetaminophen (Tylenol), Serum <10 (L) 10 - 30 ug/mL  Salicylate level  Result Value Ref Range   Salicylate Lvl <4.0 2.8 - 30.0 mg/dL  CBC with Differential  Result Value Ref Range   WBC 18.9 (H) 4.0 - 10.5 K/uL   RBC 4.86 3.87 - 5.11 MIL/uL   Hemoglobin 15.0 12.0 - 15.0 g/dL   HCT 97.9 89.2 - 11.9 %   MCV 92.0 78.0 - 100.0 fL   MCH 30.9 26.0 - 34.0 pg   MCHC 33.6 30.0 - 36.0 g/dL   RDW 41.7 40.8 - 14.4 %   Platelets 279 150 - 400 K/uL   Neutrophils Relative % 67 %   Neutro Abs 12.9 (H) 1.7 - 7.7 K/uL   Lymphocytes Relative 22 %   Lymphs Abs 4.1 (H) 0.7 - 4.0 K/uL   Monocytes Relative 9 %   Monocytes Absolute 1.6 (H) 0.1 - 1.0 K/uL   Eosinophils Relative 1 %   Eosinophils Absolute 0.2 0.0 - 0.7 K/uL   Basophils Relative 1 %   Basophils Absolute 0.1 0.0 - 0.1 K/uL  Urine rapid drug screen (hosp performed)  Result Value Ref Range   Opiates NONE DETECTED NONE DETECTED   Cocaine NONE DETECTED NONE DETECTED   Benzodiazepines POSITIVE (A) NONE DETECTED   Amphetamines NONE DETECTED NONE DETECTED   Tetrahydrocannabinol NONE DETECTED NONE DETECTED   Barbiturates NONE DETECTED NONE DETECTED  Pregnancy, urine  Result Value Ref Range   Preg Test, Ur NEGATIVE NEGATIVE    Laboratory interpretation all normal except +UDS   Imaging Review   Dg Chest 2 View  12/26/2015  CLINICAL DATA:  Subacute onset of mid chest pain. Initial encounter.  IMPRESSION: Mild peribronchial  thickening noted.  Lungs otherwise clear. Electronically Signed   By: Roanna Raider M.D.   On: 12/26/2015 01:12   Ct Head Wo Contrast  12/31/2015  CLINICAL DATA:  Altered mental status  IMPRESSION: Normal head CT Electronically Signed   By: Esperanza Heir M.D.   On: 12/31/2015 12:18   Mr Laqueta Jean ZO Contrast  01/04/2016  CLINICAL DATA:  Altered mental status.  IMPRESSION: 1. 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 2. Otherwise unremarkable brain MRI. Electronically Signed   By: Sebastian Ache M.D.   On: 01/04/2016 14:35    I have personally reviewed and evaluated these images and lab results as part of my medical decision-making.    MDM   Final diagnoses:  Anxiety  Depression  Suicidal thoughts   Disposition pending  Melissa Albe, MD, FACEP    I personally performed the services described in this documentation, which was scribed in my presence. The recorded information has been reviewed and considered.  Melissa Albe, MD, Concha Pyo, MD 01/21/16 0630

## 2016-01-20 NOTE — ED Notes (Addendum)
Pt was admitted last week and admitted to [facility] psych- She was diagnosed with Anxiety do, MDD, and reports that she is claustrophobic. She also reports that she has not eaten to to poor dental care and dental pain. States that she wants to take her medicine.  Per IVC paperwork she has been hiding razors and family has been taking from her- pt denies but when asked specifically, she reports in the last three days after originally stating "it's been awhile".

## 2016-01-21 ENCOUNTER — Emergency Department (HOSPITAL_COMMUNITY): Payer: Medicaid Other

## 2016-01-21 LAB — URINALYSIS, ROUTINE W REFLEX MICROSCOPIC
Bilirubin Urine: NEGATIVE
Glucose, UA: NEGATIVE mg/dL
HGB URINE DIPSTICK: NEGATIVE
Ketones, ur: NEGATIVE mg/dL
Leukocytes, UA: NEGATIVE
NITRITE: NEGATIVE
Protein, ur: NEGATIVE mg/dL
pH: 6 (ref 5.0–8.0)

## 2016-01-21 LAB — COMPREHENSIVE METABOLIC PANEL
ALBUMIN: 4.3 g/dL (ref 3.5–5.0)
ALK PHOS: 95 U/L (ref 38–126)
ALT: 22 U/L (ref 14–54)
ANION GAP: 11 (ref 5–15)
AST: 28 U/L (ref 15–41)
BILIRUBIN TOTAL: 0.8 mg/dL (ref 0.3–1.2)
BUN: 4 mg/dL — ABNORMAL LOW (ref 6–20)
CALCIUM: 9.3 mg/dL (ref 8.9–10.3)
CO2: 24 mmol/L (ref 22–32)
Chloride: 104 mmol/L (ref 101–111)
Creatinine, Ser: 0.72 mg/dL (ref 0.44–1.00)
GLUCOSE: 101 mg/dL — AB (ref 65–99)
POTASSIUM: 3 mmol/L — AB (ref 3.5–5.1)
Sodium: 139 mmol/L (ref 135–145)
TOTAL PROTEIN: 7 g/dL (ref 6.5–8.1)

## 2016-01-21 LAB — RAPID URINE DRUG SCREEN, HOSP PERFORMED
AMPHETAMINES: NOT DETECTED
BENZODIAZEPINES: POSITIVE — AB
Barbiturates: NOT DETECTED
COCAINE: NOT DETECTED
OPIATES: NOT DETECTED
Tetrahydrocannabinol: NOT DETECTED

## 2016-01-21 LAB — ACETAMINOPHEN LEVEL: Acetaminophen (Tylenol), Serum: <10 ug/mL — ABNORMAL LOW (ref 10–30)

## 2016-01-21 LAB — CBC WITH DIFFERENTIAL/PLATELET
BASOS ABS: 0.1 10*3/uL (ref 0.0–0.1)
BASOS PCT: 1 %
EOS ABS: 0.2 10*3/uL (ref 0.0–0.7)
EOS PCT: 1 %
HCT: 44.7 % (ref 36.0–46.0)
Hemoglobin: 15 g/dL (ref 12.0–15.0)
LYMPHS PCT: 22 %
Lymphs Abs: 4.1 10*3/uL — ABNORMAL HIGH (ref 0.7–4.0)
MCH: 30.9 pg (ref 26.0–34.0)
MCHC: 33.6 g/dL (ref 30.0–36.0)
MCV: 92 fL (ref 78.0–100.0)
Monocytes Absolute: 1.6 10*3/uL — ABNORMAL HIGH (ref 0.1–1.0)
Monocytes Relative: 9 %
Neutro Abs: 12.9 10*3/uL — ABNORMAL HIGH (ref 1.7–7.7)
Neutrophils Relative %: 67 %
PLATELETS: 279 10*3/uL (ref 150–400)
RBC: 4.86 MIL/uL (ref 3.87–5.11)
RDW: 13.4 % (ref 11.5–15.5)
WBC: 18.9 10*3/uL — AB (ref 4.0–10.5)

## 2016-01-21 LAB — PREGNANCY, URINE: Preg Test, Ur: NEGATIVE

## 2016-01-21 LAB — ETHANOL

## 2016-01-21 LAB — SALICYLATE LEVEL

## 2016-01-21 MED ORDER — DOXYCYCLINE HYCLATE 100 MG PO TABS
100.0000 mg | ORAL_TABLET | Freq: Two times a day (BID) | ORAL | Status: DC
  Administered 2016-01-21 – 2016-01-23 (×4): 100 mg via ORAL
  Filled 2016-01-21 (×4): qty 1

## 2016-01-21 MED ORDER — QUETIAPINE FUMARATE 100 MG PO TABS
300.0000 mg | ORAL_TABLET | Freq: Every day | ORAL | Status: DC
  Administered 2016-01-21 – 2016-01-22 (×2): 300 mg via ORAL
  Filled 2016-01-21 (×2): qty 3

## 2016-01-21 MED ORDER — HYDROXYZINE HCL 25 MG PO TABS
25.0000 mg | ORAL_TABLET | Freq: Four times a day (QID) | ORAL | Status: DC | PRN
  Administered 2016-01-21 – 2016-01-23 (×6): 25 mg via ORAL
  Filled 2016-01-21 (×8): qty 1

## 2016-01-21 MED ORDER — NICOTINE 21 MG/24HR TD PT24
21.0000 mg | MEDICATED_PATCH | Freq: Every day | TRANSDERMAL | Status: DC
  Administered 2016-01-21 – 2016-01-22 (×2): 21 mg via TRANSDERMAL
  Filled 2016-01-21 (×3): qty 1

## 2016-01-21 MED ORDER — POTASSIUM CHLORIDE CRYS ER 20 MEQ PO TBCR
40.0000 meq | EXTENDED_RELEASE_TABLET | Freq: Two times a day (BID) | ORAL | Status: AC
Start: 2016-01-21 — End: 2016-01-22
  Administered 2016-01-21 – 2016-01-22 (×4): 40 meq via ORAL
  Filled 2016-01-21 (×4): qty 2

## 2016-01-21 MED ORDER — POTASSIUM CHLORIDE CRYS ER 20 MEQ PO TBCR
40.0000 meq | EXTENDED_RELEASE_TABLET | Freq: Once | ORAL | Status: AC
  Administered 2016-01-21: 40 meq via ORAL
  Filled 2016-01-21: qty 2

## 2016-01-21 MED ORDER — ONDANSETRON HCL 4 MG PO TABS
4.0000 mg | ORAL_TABLET | Freq: Three times a day (TID) | ORAL | Status: DC | PRN
  Administered 2016-01-21 – 2016-01-23 (×3): 4 mg via ORAL
  Filled 2016-01-21 (×3): qty 1

## 2016-01-21 MED ORDER — ZOLPIDEM TARTRATE 5 MG PO TABS
10.0000 mg | ORAL_TABLET | Freq: Every evening | ORAL | Status: DC | PRN
  Administered 2016-01-21 – 2016-01-22 (×3): 10 mg via ORAL
  Filled 2016-01-21 (×3): qty 2

## 2016-01-21 MED ORDER — PANTOPRAZOLE SODIUM 40 MG PO TBEC
40.0000 mg | DELAYED_RELEASE_TABLET | Freq: Every day | ORAL | Status: DC
  Administered 2016-01-21 – 2016-01-23 (×3): 40 mg via ORAL
  Filled 2016-01-21 (×3): qty 1

## 2016-01-21 MED ORDER — METOPROLOL TARTRATE 25 MG PO TABS
25.0000 mg | ORAL_TABLET | Freq: Two times a day (BID) | ORAL | Status: DC
  Administered 2016-01-21 – 2016-01-23 (×6): 25 mg via ORAL
  Filled 2016-01-21 (×6): qty 1

## 2016-01-21 MED ORDER — ACETAMINOPHEN 325 MG PO TABS
650.0000 mg | ORAL_TABLET | ORAL | Status: DC | PRN
  Administered 2016-01-21 – 2016-01-22 (×4): 650 mg via ORAL
  Filled 2016-01-21 (×4): qty 2

## 2016-01-21 MED ORDER — ALUM & MAG HYDROXIDE-SIMETH 200-200-20 MG/5ML PO SUSP
30.0000 mL | ORAL | Status: DC | PRN
  Administered 2016-01-22: 30 mL via ORAL
  Filled 2016-01-21: qty 30

## 2016-01-21 MED ORDER — DULOXETINE HCL 30 MG PO CPEP
60.0000 mg | ORAL_CAPSULE | Freq: Every day | ORAL | Status: DC
  Administered 2016-01-21 – 2016-01-23 (×3): 60 mg via ORAL
  Filled 2016-01-21 (×3): qty 2

## 2016-01-21 NOTE — ED Notes (Signed)
Pt continues to request anxiety meds. Lights dimmed for pt comfort

## 2016-01-21 NOTE — ED Notes (Signed)
Pt continues to request anxiety med
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
## 2016-01-21 NOTE — BH Assessment (Signed)
Per Dr. Lucianne Muss the patient will be assessed by the NP, Fredna Dow for a final disposition.

## 2016-01-21 NOTE — ED Notes (Signed)
Lunch tray given. 

## 2016-01-21 NOTE — ED Notes (Signed)
Mother of Pt [facility]) also POA of Pt.  Wants to be called when Pt is to be discharged or sent somewhere.  Nurse informed.

## 2016-01-21 NOTE — Consult Note (Signed)
Telepsych Consultation   Reason for Consult: Increase anxiety  Referring Physician: EDP  Patient Identification: Melissa Butler  MRN:  [id number]  Principal Diagnosis: MDD (major depressive disorder), recurrent severe, without psychosis ([place])  Diagnosis:   Patient Active Problem List   Diagnosis Date Noted  . Panic disorder [F41.0] 01/04/2016  . MDD (major depressive disorder), recurrent severe, without psychosis ([place]) [F33.2] 11/15/2015  . NSTEMI (non-ST elevated myocardial infarction) ([place]) [I21.4] 11/06/2015  . Normal coronary arteries 11/06/15 [Z03.89] 11/06/2015    Total Time spent with patient: 45 minutes  Subjective:   Melissa Butler is a 40 y.o. female patient admitted with complaints increasing anxiety, racing thoughts, and suicidal thoughts.   HPI: Melissa Butler is a 40 year old female that reports passive suicidial ideation. Patient denies having a plan to harm herself.   Patient was discharged from [facility] last week due to suicidial ideation with a plan to cut herself with a razor.   Documentation in the epic chart reports that the patients mother found disposable razors from her that she had hidden in the waistband of her clothing. The family also reports that they feel as though she is suicidial.  Documentation on the IVC paperwork reports that the patint has a history of cutting herself.  During the assessment the patient denies SI/HI/Psychosis/Substance Abuse. Patient reports that she doe not know why she has been feeling so depressed. Patient denies hidng the razors or cutting herself.   Patient keeps repeating over and over again that she needs her Seroquel or some type of medication management.   Past Psychiatric History: Major depression.  Risk to Self: Suicidal Ideation: Yes-Currently Present Suicidal Intent: Yes-Currently Present Is patient at risk for suicide?: Yes Suicidal Plan?: No Access to Means: Yes Specify Access to Suicidal Means: Razor What  has been your use of drugs/alcohol within the last 12 months?: None Reported (UDS is positive for Benzos) How many times?: 1 Other Self Harm Risks: Cutting  Triggers for Past Attempts: Unpredictable Intentional Self Injurious Behavior: Cutting Comment - Self Injurious Behavior: SI by cutting her wrist a week ago Risk to Others: Homicidal Ideation: No Thoughts of Harm to Others: No Current Homicidal Intent: No Current Homicidal Plan: No Access to Homicidal Means: No Identified Victim: NA History of harm to others?: No Assessment of Violence: None Noted Violent Behavior Description: None  Does patient have access to weapons?: No Criminal Charges Pending?: No Does patient have a court date: No Prior Inpatient Therapy: Prior Inpatient Therapy: Yes Prior Therapy Dates: June and April 2017 Prior Therapy [facility]ilty[facility]Provider([facility] and [facility] Reason for Treatment: Depression Prior Outpatient Therapy: Prior Outpatient Therapy: No Prior Therapy Dates: just started 12/29/15 Prior Therapy Facilty/Provider(s): [facility] Reason for Treatment: Depression, Anxiety, confusion Does patient have an ACCT team?: No Does patient have Intensive In-House Services?  : No Does patient have [facility] services? : No Does patient have P4CC services?: No  Past Medical History:  Past Medical History  Diagnosis Date  . Hypertension   . Anxiety   . MI (myocardial infarction) ([place]) 10/2015    elevated troponin, likely due to vasospasm, clean coronary arteries on cath  . Chronic pelvic pain in female   . Chronic abdominal pain   . History of cardiac catheterization 11/06/2015    normal coronary arteries  . History of echocardiogram 11/06/2015    normal  . GERD (gastroesophageal reflux disease)   . Panic attack   . Depression     Past Surgical History  Procedure  Laterality Date  . Cesarean section    . Cardiac catheterization N/A 11/06/2015    Procedure: Left Heart Cath and Coronary Angiography;   Surgeon: Sanda Klein, MD;  Location: [place] CV LAB;  Service: Cardiovascular;  Laterality: N/A;  . No past surgeries     Family History: History reviewed. No pertinent family history.  Family Psychiatric  History: See EDP H&P  Social History:  History  Alcohol Use No     History  Drug Use No    Social History   Social History  . Marital Status: Single    Spouse Name: N/A  . Number of Children: N/A  . Years of Education: N/A   Social History Main Topics  . Smoking status: Current Every Day Smoker -- 0.50 packs/day for 5 years    Types: Cigarettes  . Smokeless tobacco: None  . Alcohol Use: No  . Drug Use: No  . Sexual Activity: Yes   Other Topics Concern  . None   Social History Narrative   Additional Social History:  Allergies:   Allergies  Allergen Reactions  . Ciprofloxacin Anaphylaxis, Hives and Rash  . Penicillins Anaphylaxis, Hives, Shortness Of Breath, Swelling and Rash    Has patient had a PCN reaction causing immediate rash, facial/tongue/throat swelling, SOB or lightheadedness with hypotension: Yes Has patient had a PCN reaction causing severe rash involving mucus membranes or skin necrosis: No Has patient had a PCN reaction that required hospitalization No Has patient had a PCN reaction occurring within the last 10 years: No If all of the above answers are "NO", then may proceed with Cephalosporin use.   Marland Kitchen Amoxicillin   . Codeine Nausea And Vomiting  . Metronidazole     Other reaction(s): Other - See Comments "All jittery and burning"  . Sulfa Antibiotics   . Toradol [Ketorolac Tromethamine]     anaphalaxis    Labs:  Results for orders placed or performed during the hospital encounter of 01/20/16 (from the past 48 hour(s))  Comprehensive metabolic panel     Status: Abnormal   Collection Time: 01/21/16  2:31 AM  Result Value Ref Range   Sodium 139 135 - 145 mmol/L   Potassium 3.0 (L) 3.5 - 5.1 mmol/L   Chloride 104 101 - 111 mmol/L    CO2 24 22 - 32 mmol/L   Glucose, Bld 101 (H) 65 - 99 mg/dL   BUN 4 (L) 6 - 20 mg/dL   Creatinine, Ser 0.72 0.44 - 1.00 mg/dL   Calcium 9.3 8.9 - 10.3 mg/dL   Total Protein 7.0 6.5 - 8.1 g/dL   Albumin 4.3 3.5 - 5.0 g/dL   AST 28 15 - 41 U/L   ALT 22 14 - 54 U/L   Alkaline Phosphatase 95 38 - 126 U/L   Total Bilirubin 0.8 0.3 - 1.2 mg/dL   GFR calc non Af Amer >60 >60 mL/min   GFR calc Af Amer >60 >60 mL/min    Comment: (NOTE) The eGFR has been calculated using the CKD EPI equation. This calculation has not been validated in all clinical situations. eGFR's persistently <60 mL/min signify possible Chronic Kidney Disease.    Anion gap 11 5 - 15  Ethanol     Status: None   Collection Time: 01/21/16  2:31 AM  Result Value Ref Range   Alcohol, Ethyl (B) <5 <5 mg/dL    Comment:        LOWEST DETECTABLE LIMIT FOR SERUM ALCOHOL IS 5 mg/dL FOR  MEDICAL PURPOSES ONLY   Acetaminophen level     Status: Abnormal   Collection Time: 01/21/16  2:31 AM  Result Value Ref Range   Acetaminophen (Tylenol), Serum <10 (L) 10 - 30 ug/mL    Comment:        THERAPEUTIC CONCENTRATIONS VARY SIGNIFICANTLY. A RANGE OF 10-30 ug/mL MAY BE AN EFFECTIVE CONCENTRATION FOR MANY PATIENTS. HOWEVER, SOME ARE BEST TREATED AT CONCENTRATIONS OUTSIDE THIS RANGE. ACETAMINOPHEN CONCENTRATIONS >150 ug/mL AT 4 HOURS AFTER INGESTION AND >50 ug/mL AT 12 HOURS AFTER INGESTION ARE OFTEN ASSOCIATED WITH TOXIC REACTIONS.   Salicylate level     Status: None   Collection Time: 01/21/16  2:31 AM  Result Value Ref Range   Salicylate Lvl <8.1 2.8 - 30.0 mg/dL  CBC with Differential     Status: Abnormal   Collection Time: 01/21/16  2:31 AM  Result Value Ref Range   WBC 18.9 (H) 4.0 - 10.5 K/uL   RBC 4.86 3.87 - 5.11 MIL/uL   Hemoglobin 15.0 12.0 - 15.0 g/dL   HCT 44.7 36.0 - 46.0 %   MCV 92.0 78.0 - 100.0 fL   MCH 30.9 26.0 - 34.0 pg   MCHC 33.6 30.0 - 36.0 g/dL   RDW 13.4 11.5 - 15.5 %   Platelets 279 150 - 400  K/uL   Neutrophils Relative % 67 %   Neutro Abs 12.9 (H) 1.7 - 7.7 K/uL   Lymphocytes Relative 22 %   Lymphs Abs 4.1 (H) 0.7 - 4.0 K/uL   Monocytes Relative 9 %   Monocytes Absolute 1.6 (H) 0.1 - 1.0 K/uL   Eosinophils Relative 1 %   Eosinophils Absolute 0.2 0.0 - 0.7 K/uL   Basophils Relative 1 %   Basophils Absolute 0.1 0.0 - 0.1 K/uL  Urine rapid drug screen (hosp performed)     Status: Abnormal   Collection Time: 01/21/16  2:47 AM  Result Value Ref Range   Opiates NONE DETECTED NONE DETECTED   Cocaine NONE DETECTED NONE DETECTED   Benzodiazepines POSITIVE (A) NONE DETECTED   Amphetamines NONE DETECTED NONE DETECTED   Tetrahydrocannabinol NONE DETECTED NONE DETECTED   Barbiturates NONE DETECTED NONE DETECTED    Comment:        DRUG SCREEN FOR MEDICAL PURPOSES ONLY.  IF CONFIRMATION IS NEEDED FOR ANY PURPOSE, NOTIFY LAB WITHIN 5 DAYS.        LOWEST DETECTABLE LIMITS FOR URINE DRUG SCREEN Drug Class       Cutoff (ng/mL) Amphetamine      1000 Barbiturate      200 Benzodiazepine   017 Tricyclics       510 Opiates          300 Cocaine          300 THC              50   Pregnancy, urine     Status: None   Collection Time: 01/21/16  2:47 AM  Result Value Ref Range   Preg Test, Ur NEGATIVE NEGATIVE    Comment:        THE SENSITIVITY OF THIS METHODOLOGY IS >20 mIU/mL.     Current Facility-Administered Medications  Medication Dose Route Frequency Provider Last Rate Last Dose  . acetaminophen (TYLENOL) tablet 650 mg  650 mg Oral Q4H PRN Rolland Porter, MD   650 mg at 01/21/16 1348  . alum & mag hydroxide-simeth (MAALOX/MYLANTA) 200-200-20 MG/5ML suspension 30 mL  30 mL Oral PRN Rolland Porter, MD      .  DULoxetine (CYMBALTA) DR capsule 60 mg  60 mg Oral Daily Rolland Porter, MD   60 mg at 01/21/16 1004  . hydrOXYzine (ATARAX/VISTARIL) tablet 25 mg  25 mg Oral Q6H PRN Rolland Porter, MD   25 mg at 01/21/16 1003  . metoprolol tartrate (LOPRESSOR) tablet 25 mg  25 mg Oral BID Rolland Porter, MD   25  mg at 01/21/16 1003  . nicotine (NICODERM CQ - dosed in mg/24 hours) patch 21 mg  21 mg Transdermal Daily Rolland Porter, MD   21 mg at 01/21/16 1003  . ondansetron (ZOFRAN) tablet 4 mg  4 mg Oral Q8H PRN Rolland Porter, MD   4 mg at 01/21/16 1025  . pantoprazole (PROTONIX) EC tablet 40 mg  40 mg Oral Daily Rolland Porter, MD   40 mg at 01/21/16 1004  . potassium chloride SA (K-DUR,KLOR-CON) CR tablet 40 mEq  40 mEq Oral BID Rolland Porter, MD   40 mEq at 01/21/16 1004  . QUEtiapine (SEROQUEL) tablet 300 mg  300 mg Oral QHS Rolland Porter, MD      . zolpidem (AMBIEN) tablet 10 mg  10 mg Oral QHS PRN Rolland Porter, MD   10 mg at 01/21/16 8099   Current Outpatient Prescriptions  Medication Sig Dispense Refill  . albuterol (PROAIR HFA) 108 (90 Base) MCG/ACT inhaler Inhale 2 puffs into the lungs every 6 (six) hours as needed for wheezing or shortness of breath. (Patient not taking: Reported on 12/30/2015)    . beclomethasone (QVAR) 80 MCG/ACT inhaler Inhale 2 puffs into the lungs 2 (two) times daily.    . DULoxetine (CYMBALTA) 60 MG capsule Take 1 capsule (60 mg total) by mouth daily. 30 capsule 0  . hydrOXYzine (ATARAX/VISTARIL) 25 MG tablet Take 1 tablet (25 mg total) by mouth every 6 (six) hours as needed for anxiety. 30 tablet 0  . metoprolol tartrate (LOPRESSOR) 25 MG tablet Take 1 tablet (25 mg total) by mouth 2 (two) times daily. 60 tablet 0  . pantoprazole (PROTONIX) 40 MG tablet Take 1 tablet (40 mg total) by mouth daily. 30 tablet 0  . QUEtiapine (SEROQUEL) 300 MG tablet Take 1 tablet (300 mg total) by mouth at bedtime. 30 tablet 0  . [DISCONTINUED] dicyclomine (BENTYL) 20 MG tablet Take 1 tablet (20 mg total) by mouth every 6 (six) hours as needed for spasms (abdominal cramping). (Patient not taking: Reported on 12/19/2015) 15 tablet 0   Psychiatric Specialty Exam: Physical Exam : See ED notes  ROS : See ED notes  Blood pressure 126/78, pulse 81, temperature 98.6 F (37 C), temperature source Oral, resp. rate 18,  height _0  (1.702 m), weight 77.111 kg (170 lb), last menstrual period 01/18/2016, SpO2 100 %.Body mass index is 26.62 kg/(m^2).  General Appearance: Disheveled. in paper scrubs  Eye Contact:  Fair  Speech:  Slow, not spontaneous  Volume:  Decreased  Mood:  Depressed  Affect:  Flat  Thought Process:  Linear  Orientation:  Full (Time, Place, and Person)  Thought Content:  Ruminationation, regarding anxiety and medication  Suicidal Thoughts:  Denies  Homicidal Thoughts:  No  Memory:  Immediate;   Fair Recent;   Poor Remote;   Poor  Judgement:  Impaired  Insight:  Shallow  Psychomotor Activity:  Decreased  Concentration:  Concentration: Poor and Attention Span: Poor  Recall:  Poor  Fund of Knowledge:  Poor  Language:  Fair  Akathisia:  Negative  Handed:  Right  AIMS (if indicated):  Assets:  Desire for Improvement  ADL's:  Intact  Cognition:  WNL  Sleep:      Treatment Plan Summary: Will wait for EDP to clear medically. Pt with persisent elevated WBC on multiple admission. WIll obtain UA, CXR. Per patient she was on an abx for vaginal infection, but did not complete the doses. Will seek placement out of system, recently discharged from [facility] 10 days ago(LOS 8 days).  Daily contact with patient to assess and evaluate symptoms and progress in treatment  Disposition: Recommend psychiatric Inpatient admission when medically cleared. Patient presents with thought blocking.   Nanci Pina, FNP 01/21/2016 3:08 PM

## 2016-01-21 NOTE — ED Notes (Signed)
Pt request meds for sleep- [facility] will be unable to assess until next shift- pt given med to assist with rest and restorative sleep

## 2016-01-21 NOTE — ED Notes (Signed)
Pt request anxiety meds and request all persons within eyesight to stay with her as she is scared. Pt does not appear frightened, but asks repeatedly regarding anxiety meds

## 2016-01-21 NOTE — ED Notes (Signed)
Call to [facility]. There is one couselor tonight and it may be next shift when pt is assessed per [facility] staff

## 2016-01-21 NOTE — ED Provider Notes (Signed)
Pt re examined, pending psych disposition.  Pt has a persistently elevated WBC.  She complains of cough, and toothache.  She also mentions that she is supposed to be on abx for a vaginal infection.  She denies any abdominal pain. Physical Exam  BP 126/78 mmHg  Pulse 81  Temp(Src) 98.6 F (37 C) (Oral)  Resp 18  Ht 5\' 7"  (1.702 m)  Wt 77.111 kg  BMI 26.62 kg/m2  SpO2 100%  LMP 01/18/2016  Physical Exam  Constitutional: She appears well-developed and well-nourished. No distress.  HENT:  Head: Normocephalic and atraumatic.  Right Ear: External ear normal.  Left Ear: External ear normal.  Mouth/Throat: No trismus in the jaw. Dental caries present. No oropharyngeal exudate or posterior oropharyngeal edema.  Eyes: Conjunctivae are normal. Right eye exhibits no discharge. Left eye exhibits no discharge. No scleral icterus.  Neck: Neck supple. No tracheal deviation present.  Cardiovascular: Normal rate.   Pulmonary/Chest: Effort normal and breath sounds normal. No stridor. No respiratory distress. She has no wheezes. She has no rales.  Abdominal: Soft. Bowel sounds are normal. She exhibits no distension. There is no tenderness. There is no rebound and no guarding.  Musculoskeletal: She exhibits no edema.  Lymphadenopathy:    She has no cervical adenopathy.  Neurological: She is alert. Cranial nerve deficit: no gross deficits.  Skin: Skin is warm and dry. No rash noted.  Psychiatric: She has a normal mood and affect.  Nursing note and vitals reviewed.   ED Course  Procedures  MDM No obvious sign of infection.  Will add on CXR and UA.  Will rx doxycycline for the vaginal infection that she has by history.        Linwood Dibbles, MD 01/21/16 [phone]

## 2016-01-21 NOTE — ED Provider Notes (Signed)
Patient's chest x-ray and urinalysis are unremarkable. Patient is medically cleared and is awaiting psychiatric bed  Bethann Berkshire, MD 01/21/16 2134

## 2016-01-21 NOTE — ED Notes (Signed)
BH called to do another assessment on Pt.  TV placed in the room.

## 2016-01-22 NOTE — ED Notes (Signed)
Crackers given per pt request.

## 2016-01-22 NOTE — ED Notes (Signed)
Pt ambulatory to bathroom and back to room 

## 2016-01-22 NOTE — BHH Counselor (Addendum)
Referral faxed to following as beds are available: [facility]  No beds available: [facility]  Evette Cristal, [place] Therapeutic Triage Specialist

## 2016-01-22 NOTE — ED Notes (Signed)
Pt requesting anxiety medication, pt informed that she can have her next dose of vistaril at 1900.

## 2016-01-22 NOTE — ED Notes (Signed)
Report received from Samantha, RN. 

## 2016-01-22 NOTE — BHH Counselor (Addendum)
Jasmine December at [facility] -  She isn't reviewing referrals currently as their ED is full. Left voicemail for Cedric at [facility]. Rosalita Chessman at [facility] - hasn't reviewed referral yet.  [facility] - left voicemail [facility] - declined d/t pt acuity.  Noella at [facility] - declined d/t adult Medicaid.  Evette Cristal, [place] Therapeutic Triage Specialist

## 2016-01-23 NOTE — BH Assessment (Signed)
Pt has been accepted to [facility] and can be transported before 6 or after 8 am. Dr. Maryelizabeth Kaufmann is the accepting doctor and report can be called to [phone].

## 2016-01-23 NOTE — ED Provider Notes (Signed)
  Physical Exam  BP 107/63 mmHg  Pulse 105  Temp(Src) 98.2 F (36.8 C) (Oral)  Resp 18  Ht 5\' 7"  (1.702 m)  Wt 170 lb (77.111 kg)  BMI 26.62 kg/m2  SpO2 99%  LMP 01/18/2016  Physical Exam  ED Course  Procedures  MDM Accepted at [facility]. By Dr. Maryelizabeth Kaufmann.      Benjiman Core, MD 01/23/16 0830

## 2016-01-23 NOTE — ED Notes (Signed)
[place] sherrif notified for pt transport

## 2016-01-23 NOTE — ED Notes (Signed)
Mother notified of pts transfer to [place] and phone number provided

## 2016-01-23 NOTE — ED Notes (Signed)
Officer in to transport. All belongings and paper work sent with pt

## 2016-02-12 ENCOUNTER — Emergency Department (HOSPITAL_COMMUNITY)
Admission: EM | Admit: 2016-02-12 | Discharge: 2016-02-14 | Disposition: A | Discharge status: Home / Self Care | Payer: Medicaid Other | Attending: Emergency Medicine | Admitting: Emergency Medicine

## 2016-02-12 ENCOUNTER — Encounter (HOSPITAL_COMMUNITY): Payer: Self-pay | Admitting: *Deleted

## 2016-02-12 DIAGNOSIS — F32A Depression, unspecified: Secondary | ICD-10-CM

## 2016-02-12 DIAGNOSIS — G2571 Drug induced akathisia: Secondary | ICD-10-CM | POA: Diagnosis not present

## 2016-02-12 DIAGNOSIS — I10 Essential (primary) hypertension: Secondary | ICD-10-CM | POA: Diagnosis not present

## 2016-02-12 DIAGNOSIS — F329 Major depressive disorder, single episode, unspecified: Secondary | ICD-10-CM | POA: Insufficient documentation

## 2016-02-12 DIAGNOSIS — F17210 Nicotine dependence, cigarettes, uncomplicated: Secondary | ICD-10-CM | POA: Diagnosis not present

## 2016-02-12 DIAGNOSIS — Z79899 Other long term (current) drug therapy: Secondary | ICD-10-CM | POA: Diagnosis not present

## 2016-02-12 DIAGNOSIS — F419 Anxiety disorder, unspecified: Secondary | ICD-10-CM | POA: Diagnosis not present

## 2016-02-12 DIAGNOSIS — G4700 Insomnia, unspecified: Secondary | ICD-10-CM | POA: Diagnosis not present

## 2016-02-12 DIAGNOSIS — I252 Old myocardial infarction: Secondary | ICD-10-CM | POA: Insufficient documentation

## 2016-02-12 LAB — COMPREHENSIVE METABOLIC PANEL
ALBUMIN: 4.3 g/dL (ref 3.5–5.0)
ALK PHOS: 94 U/L (ref 38–126)
ALT: 26 U/L (ref 14–54)
ANION GAP: 12 (ref 5–15)
AST: 35 U/L (ref 15–41)
BUN: <5 mg/dL — ABNORMAL LOW (ref 6–20)
CHLORIDE: 104 mmol/L (ref 101–111)
CO2: 22 mmol/L (ref 22–32)
CREATININE: 0.79 mg/dL (ref 0.44–1.00)
Calcium: 9.4 mg/dL (ref 8.9–10.3)
GFR calc non Af Amer: >60 mL/min (ref >60)
GLUCOSE: 172 mg/dL — AB (ref 65–99)
Potassium: 3 mmol/L — ABNORMAL LOW (ref 3.5–5.1)
SODIUM: 138 mmol/L (ref 135–145)
Total Bilirubin: 0.7 mg/dL (ref 0.3–1.2)
Total Protein: 7.2 g/dL (ref 6.5–8.1)

## 2016-02-12 LAB — ETHANOL: Alcohol, Ethyl (B): <5 mg/dL (ref <5)

## 2016-02-12 LAB — ACETAMINOPHEN LEVEL

## 2016-02-12 LAB — TROPONIN I
TROPONIN I: 0.53 ng/mL — AB (ref <0.03)
Troponin I: 0.51 ng/mL (ref <0.03)

## 2016-02-12 LAB — CBC
HEMATOCRIT: 45.8 % (ref 36.0–46.0)
HEMOGLOBIN: 14.9 g/dL (ref 12.0–15.0)
MCH: 30.2 pg (ref 26.0–34.0)
MCHC: 32.5 g/dL (ref 30.0–36.0)
MCV: 92.9 fL (ref 78.0–100.0)
Platelets: 295 10*3/uL (ref 150–400)
RBC: 4.93 MIL/uL (ref 3.87–5.11)
RDW: 13.7 % (ref 11.5–15.5)
WBC: 14.5 10*3/uL — ABNORMAL HIGH (ref 4.0–10.5)

## 2016-02-12 LAB — RAPID URINE DRUG SCREEN, HOSP PERFORMED
AMPHETAMINES: NOT DETECTED
BARBITURATES: NOT DETECTED
BENZODIAZEPINES: POSITIVE — AB
COCAINE: NOT DETECTED
OPIATES: NOT DETECTED
TETRAHYDROCANNABINOL: NOT DETECTED

## 2016-02-12 LAB — SALICYLATE LEVEL

## 2016-02-12 MED ORDER — HYDROXYZINE HCL 25 MG PO TABS
25.0000 mg | ORAL_TABLET | Freq: Four times a day (QID) | ORAL | Status: DC | PRN
  Administered 2016-02-12 – 2016-02-14 (×4): 25 mg via ORAL
  Filled 2016-02-12 (×4): qty 1

## 2016-02-12 MED ORDER — POTASSIUM CHLORIDE CRYS ER 20 MEQ PO TBCR
40.0000 meq | EXTENDED_RELEASE_TABLET | Freq: Once | ORAL | Status: AC
  Administered 2016-02-12: 40 meq via ORAL
  Filled 2016-02-12: qty 2

## 2016-02-12 MED ORDER — BUSPIRONE HCL 5 MG PO TABS
ORAL_TABLET | ORAL | Status: AC
  Filled 2016-02-12: qty 2

## 2016-02-12 MED ORDER — BUDESONIDE 0.25 MG/2ML IN SUSP
2.0000 mL | Freq: Two times a day (BID) | RESPIRATORY_TRACT | Status: DC
  Filled 2016-02-12 (×5): qty 2

## 2016-02-12 MED ORDER — CLINDAMYCIN HCL 150 MG PO CAPS
300.0000 mg | ORAL_CAPSULE | Freq: Three times a day (TID) | ORAL | Status: DC
  Administered 2016-02-12 – 2016-02-14 (×7): 300 mg via ORAL
  Filled 2016-02-12 (×6): qty 2

## 2016-02-12 MED ORDER — MIRTAZAPINE 30 MG PO TABS
30.0000 mg | ORAL_TABLET | Freq: Every day | ORAL | Status: DC
  Administered 2016-02-12 – 2016-02-13 (×2): 30 mg via ORAL
  Filled 2016-02-12 (×3): qty 1

## 2016-02-12 MED ORDER — BUSPIRONE HCL 5 MG PO TABS
10.0000 mg | ORAL_TABLET | Freq: Three times a day (TID) | ORAL | Status: DC
  Administered 2016-02-12 – 2016-02-14 (×6): 10 mg via ORAL
  Filled 2016-02-12 (×8): qty 1

## 2016-02-12 MED ORDER — QUETIAPINE FUMARATE 100 MG PO TABS: 300.0000 mg | ORAL_TABLET | Freq: Every day | ORAL | Status: DC

## 2016-02-12 MED ORDER — DULOXETINE HCL 30 MG PO CPEP: 60.0000 mg | ORAL_CAPSULE | Freq: Every day | ORAL | Status: DC

## 2016-02-12 MED ORDER — DOCUSATE SODIUM 100 MG PO CAPS
100.0000 mg | ORAL_CAPSULE | Freq: Two times a day (BID) | ORAL | Status: DC
  Filled 2016-02-12 (×6): qty 1

## 2016-02-12 MED ORDER — MIRTAZAPINE 30 MG PO TABS
ORAL_TABLET | ORAL | Status: AC
  Filled 2016-02-12: qty 1

## 2016-02-12 MED ORDER — METOPROLOL TARTRATE 25 MG PO TABS
25.0000 mg | ORAL_TABLET | Freq: Two times a day (BID) | ORAL | Status: DC
  Administered 2016-02-12 – 2016-02-14 (×3): 25 mg via ORAL
  Filled 2016-02-12 (×4): qty 1

## 2016-02-12 MED ORDER — ZOLPIDEM TARTRATE 5 MG PO TABS
10.0000 mg | ORAL_TABLET | Freq: Once | ORAL | Status: AC
  Administered 2016-02-12: 10 mg via ORAL
  Filled 2016-02-12: qty 2

## 2016-02-12 MED ORDER — PANTOPRAZOLE SODIUM 40 MG PO TBEC
40.0000 mg | DELAYED_RELEASE_TABLET | Freq: Every day | ORAL | Status: DC
  Administered 2016-02-13 – 2016-02-14 (×2): 40 mg via ORAL
  Filled 2016-02-12 (×2): qty 1

## 2016-02-12 MED ORDER — LORAZEPAM 1 MG PO TABS
2.0000 mg | ORAL_TABLET | Freq: Once | ORAL | Status: AC
  Administered 2016-02-12: 2 mg via ORAL
  Filled 2016-02-12: qty 2

## 2016-02-12 MED ORDER — ACETAMINOPHEN 500 MG PO TABS
1000.0000 mg | ORAL_TABLET | Freq: Once | ORAL | Status: AC
  Administered 2016-02-12: 1000 mg via ORAL
  Filled 2016-02-12: qty 2

## 2016-02-12 MED ORDER — CLONAZEPAM 0.5 MG PO TABS
0.5000 mg | ORAL_TABLET | Freq: Three times a day (TID) | ORAL | Status: DC | PRN
  Administered 2016-02-12 – 2016-02-14 (×3): 0.5 mg via ORAL
  Filled 2016-02-12 (×3): qty 1

## 2016-02-12 NOTE — ED Notes (Signed)
Dr Adriana Simas notified of abnormal troponin, patient placed on monitor and saline lock established

## 2016-02-12 NOTE — ED Notes (Signed)
Pt was given deodorant upon request.

## 2016-02-12 NOTE — ED Notes (Signed)
Pt c/o of chest pain and worsened anxiety. States she believes the pain is related to the anxiety and the fact that she hasnt sleep "in days". Cook informed. See [facility]

## 2016-02-12 NOTE — ED Notes (Signed)
Mother Melissa Butler [phone]/    Home [phone]

## 2016-02-12 NOTE — ED Notes (Signed)
Monitor placed in the room for [facility], and paperwork faxed as requested.
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
## 2016-02-12 NOTE — ED Notes (Signed)
Pt comes in with [facility], pt has IVC papers taken out on her by her mother. Pt was recently seen here then sent to [facility] for psychiatric evaluation. Pt has been home 1 week. Per mother, pt has been refusing to bathe, eat, and refuses to follow up with [facility]. Pt confirms this and states she "just didn't feel like it." Pt states she is taking her medication as prescribed. Pt denies any SI/HI. Pt states she sometimes hears things, but not at this time. Pt is anxious upon triage and wants to stand. Pt is pacing in this room.

## 2016-02-12 NOTE — ED Provider Notes (Signed)
CSN: [id number]     Arrival date & time 02/12/16  1522 History   First MD Initiated Contact with Patient 02/12/16 1554     Chief complaint:  depression   (Consider location/radiation/quality/duration/timing/severity/associated sxs/prior Treatment) HPI...Marland KitchenMarland KitchenLevel V caveat for psychiatric illness. Patient has had 4 psychiatric hospitalizations this year including this time.   She has been involuntarily committed by her mother today. Part of history obtained from mother. Patient is anxious, depressed, losing weight. She has been on multiple medications to no avail. There is a history of wrist cutting. She apparently had a seizure after taking her most recent prescription Saphris.  Status post non-STEMI on 11/06/15. Troponin was elevated at that time, but cardiac catheterization normal. Review of systems positive for dental caries and involuntary flapping of her arms.  She feels anxious today.  Past Medical History  Diagnosis Date  . Hypertension   . Anxiety   . MI (myocardial infarction) (HCC) 10/2015    elevated troponin, likely due to vasospasm, clean coronary arteries on cath  . Chronic pelvic pain in female   . Chronic abdominal pain   . History of cardiac catheterization 11/06/2015    normal coronary arteries  . History of echocardiogram 11/06/2015    normal  . GERD (gastroesophageal reflux disease)   . Panic attack   . Depression    Past Surgical History  Procedure Laterality Date  . Cesarean section    . Cardiac catheterization N/A 11/06/2015    Procedure: Left Heart Cath and Coronary Angiography;  Surgeon: Thurmon Fair, MD;  Location: [facility] INVASIVE CV LAB;  Service: Cardiovascular;  Laterality: N/A;  . No past surgeries     No family history on file. Social History  Substance Use Topics  . Smoking status: Current Every Day Smoker -- 0.50 packs/day for 5 years    Types: Cigarettes  . Smokeless tobacco: None  . Alcohol Use: No   OB History    Gravida Para Term Preterm AB TAB SAB  Ectopic Multiple Living            2     Review of Systems  Reason unable to perform ROS: psychiatric issues.      Allergies  Ciprofloxacin; Penicillins; Amoxicillin; Codeine; Metronidazole; Sulfa antibiotics; and Toradol  Home Medications   Prior to Admission medications   Medication Sig Start Date End Date Taking? Authorizing Provider  beclomethasone (QVAR) 80 MCG/ACT inhaler Inhale 2 puffs into the lungs 2 (two) times daily.   Yes Historical Provider, MD  busPIRone (BUSPAR) 10 MG tablet Take 10 mg by mouth 3 (three) times daily.   Yes Historical Provider, MD  docusate sodium (COLACE) 100 MG capsule Take 100 mg by mouth 2 (two) times daily.   Yes Historical Provider, MD  metoprolol tartrate (LOPRESSOR) 25 MG tablet Take 1 tablet (25 mg total) by mouth 2 (two) times daily. 01/09/16  Yes Jimmy Footman, MD  mirtazapine (REMERON) 30 MG tablet Take 30 mg by mouth at bedtime.   Yes Historical Provider, MD  pantoprazole (PROTONIX) 40 MG tablet Take 1 tablet (40 mg total) by mouth daily. 01/09/16  Yes Jimmy Footman, MD  clonazePAM (KLONOPIN) 0.5 MG tablet Take 1 tablet by mouth 3 (three) times daily as needed for anxiety.  12/19/15   Historical Provider, MD  DULoxetine (CYMBALTA) 60 MG capsule Take 1 capsule (60 mg total) by mouth daily. 01/09/16   Jimmy Footman, MD  hydrOXYzine (ATARAX/VISTARIL) 25 MG tablet Take 1 tablet (25 mg total) by mouth every 6 (  six) hours as needed for anxiety. 01/09/16   Jimmy Footman, MD  QUEtiapine (SEROQUEL) 300 MG tablet Take 1 tablet (300 mg total) by mouth at bedtime. Patient not taking: Reported on 02/12/2016 01/09/16   Jimmy Footman, MD   BP 136/87 mmHg  Pulse 79  Temp(Src) 97.6 F (36.4 C) (Oral)  Resp 16  Ht 5\' 3"  (1.6 m)  Wt 153 lb (69.4 kg)  BMI 27.11 kg/m2  SpO2 94%  LMP 01/29/2016 Physical Exam  Constitutional: She is oriented to person, place, and time.  Arms are flapping back and forth,   NAD  HENT:  Head: Normocephalic and atraumatic.  Eyes: Conjunctivae are normal.  Neck: Neck supple.  Cardiovascular: Normal rate and regular rhythm.   Pulmonary/Chest: Effort normal and breath sounds normal.  Abdominal: Soft. Bowel sounds are normal.  Musculoskeletal: Normal range of motion.  Neurological: She is alert and oriented to person, place, and time.  Skin: Skin is warm and dry.  Psychiatric:  Anxious, flat affect, depressed  Nursing note and vitals reviewed.   ED Course  Procedures (including critical care time) Labs Review Labs Reviewed  COMPREHENSIVE METABOLIC PANEL - Abnormal; Notable for the following:    Potassium 3.0 (*)    Glucose, Bld 172 (*)    BUN <5 (*)    All other components within normal limits  ACETAMINOPHEN LEVEL - Abnormal; Notable for the following:    Acetaminophen (Tylenol), Serum <10 (*)    All other components within normal limits  CBC - Abnormal; Notable for the following:    WBC 14.5 (*)    All other components within normal limits  URINE RAPID DRUG SCREEN, HOSP PERFORMED - Abnormal; Notable for the following:    Benzodiazepines POSITIVE (*)    All other components within normal limits  TROPONIN I - Abnormal; Notable for the following:    Troponin I 0.51 (*)    All other components within normal limits  ETHANOL  SALICYLATE LEVEL  TROPONIN I    Imaging Review No results found. I have personally reviewed and evaluated these images and lab results as part of my medical decision-making.   EKG Interpretation   Date/Time:  Friday February 12 2016 17:59:55 EDT Ventricular Rate:  77 PR Interval:  122 QRS Duration: 78 QT Interval:  412 QTC Calculation: 466 R Axis:   -59 Text Interpretation:  Normal sinus rhythm Left anterior fascicular block  Septal infarct , age undetermined Abnormal ECG Confirmed by Balbina Depace  MD,  Mollyann Halbert ([id number]) on 02/12/2016 6:12:13 PM      MDM   Final diagnoses:  Depression    Patient is depressed with somatic  complaints. Will obtain behavioral health consult.  Elevated troponin noted. This is been elevated in the past. Cardiac catheterization on 11/06/15 within normal limits. Will obtain delta troponin at 2300.    Donnetta Hutching, MD 02/12/16 2055

## 2016-02-12 NOTE — ED Notes (Signed)
Per patient Seroquel was discontinued. Spoke with Dr. Adriana Simas, will not given and make [facility] aware.

## 2016-02-13 ENCOUNTER — Encounter (HOSPITAL_COMMUNITY): Payer: Self-pay | Admitting: Registered Nurse

## 2016-02-13 DIAGNOSIS — G2571 Drug induced akathisia: Secondary | ICD-10-CM | POA: Diagnosis present

## 2016-02-13 LAB — TROPONIN I: Troponin I: 0.51 ng/mL (ref <0.03)

## 2016-02-13 MED ORDER — LORAZEPAM 1 MG PO TABS
1.0000 mg | ORAL_TABLET | Freq: Three times a day (TID) | ORAL | Status: DC
  Administered 2016-02-13 – 2016-02-14 (×4): 1 mg via ORAL
  Filled 2016-02-13 (×4): qty 1

## 2016-02-13 MED ORDER — QUETIAPINE FUMARATE 100 MG PO TABS
300.0000 mg | ORAL_TABLET | Freq: Every day | ORAL | Status: DC
  Administered 2016-02-14: 300 mg via ORAL
  Filled 2016-02-13: qty 3

## 2016-02-13 MED ORDER — TRAZODONE HCL 50 MG PO TABS
100.0000 mg | ORAL_TABLET | Freq: Every evening | ORAL | Status: DC | PRN
  Administered 2016-02-13: 100 mg via ORAL
  Filled 2016-02-13: qty 2

## 2016-02-13 MED ORDER — BENZTROPINE MESYLATE 1 MG PO TABS
ORAL_TABLET | ORAL | Status: AC
  Filled 2016-02-13: qty 1

## 2016-02-13 MED ORDER — ASPIRIN 81 MG PO CHEW
324.0000 mg | CHEWABLE_TABLET | Freq: Once | ORAL | Status: AC
  Administered 2016-02-13: 324 mg via ORAL
  Filled 2016-02-13: qty 4

## 2016-02-13 MED ORDER — ACETAMINOPHEN 500 MG PO TABS
1000.0000 mg | ORAL_TABLET | Freq: Four times a day (QID) | ORAL | Status: DC | PRN
  Administered 2016-02-13: 1000 mg via ORAL
  Filled 2016-02-13: qty 2

## 2016-02-13 MED ORDER — BENZTROPINE MESYLATE 1 MG PO TABS
1.0000 mg | ORAL_TABLET | Freq: Two times a day (BID) | ORAL | Status: DC
  Administered 2016-02-13 – 2016-02-14 (×3): 1 mg via ORAL
  Filled 2016-02-13 (×2): qty 1

## 2016-02-13 MED ORDER — SERTRALINE HCL 50 MG PO TABS
100.0000 mg | ORAL_TABLET | Freq: Every day | ORAL | Status: DC
  Administered 2016-02-13 – 2016-02-14 (×2): 100 mg via ORAL
  Filled 2016-02-13 (×2): qty 2

## 2016-02-13 MED ORDER — BUDESONIDE 0.25 MG/2ML IN SUSP
2.0000 mL | RESPIRATORY_TRACT | Status: DC | PRN
  Filled 2016-02-13: qty 2

## 2016-02-13 MED ORDER — BENZTROPINE MESYLATE 1 MG/ML IJ SOLN
2.0000 mg | Freq: Once | INTRAMUSCULAR | Status: AC
Start: 2016-02-13 — End: 2016-02-13
  Administered 2016-02-13: 2 mg via INTRAMUSCULAR
  Filled 2016-02-13: qty 2

## 2016-02-13 NOTE — ED Notes (Signed)
Pt ambulated to bathroom 

## 2016-02-13 NOTE — ED Notes (Signed)
Respiratory notified of pulmicort order.

## 2016-02-13 NOTE — ED Notes (Signed)
Pt reports diarrhea and still has cp.  Dr. Verdie Mosher notified and pt still on cardiac monitor.

## 2016-02-13 NOTE — ED Notes (Signed)
Patient requesting something for dental pain. Dr. Reynolds Bowl made aware.

## 2016-02-13 NOTE — ED Notes (Signed)
States pain is still in chest. Dr. Rosalia Hammers notified. Patient in no acute distress.

## 2016-02-13 NOTE — ED Notes (Signed)
Patient complaining of chest pain. Dr. Rosalia Hammers made aware. New orders given. See MAR.

## 2016-02-13 NOTE — ED Notes (Signed)
Patient requesting something to help her sleep. Dr. Rosalia Hammers made aware. New verbal orders given from Dr. Rosalia Hammers.

## 2016-02-13 NOTE — Consult Note (Signed)
Telepsych Consultation   Reason for Consult:  "Depression, anxiety, and medicine not working" Referring Physician:  EDP Patient Identification: Melissa Butler MRN:  [id number] Principal Diagnosis: Drug induced akathisia Diagnosis:   Patient Active Problem List   Diagnosis Date Noted  . 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 Panic disorder [F41.0] 01/04/2016  . MDD (major depressive disorder), recurrent severe, without psychosis ([place]) [F33.2] 11/15/2015  . NSTEMI (non-ST elevated myocardial infarction) ([place]) [I21.4] 11/06/2015  . Normal coronary arteries 11/06/15 [Z03.89] 11/06/2015    Total Time spent with patient: 30 minutes  Subjective:   Melissa Butler is a 40 y.o. female patient admitted to [facility] related IVC by family reporting of worsening depression, anxiety, and stating that her medication is not working.  HPI:  Melissa Butler 40 y.o. female patient seen by this provider and chart reviewed 02/13/2016.  On evaluation:  Melissa Butler reports that she is having worsening depression and feeling that her medications is not working.  Patient reports that she started Saphris 2 weeks ago and that is when she had her first seizure.  States since then she has had 2 more seizures and feel restless and is unable to stop moving.  Patient states that her mind is also racing.  Patient states that she is hearing voices but that is a base line for her.  Reports that the voices are telling her about her medications and doesn't tell her negative things.  Patient denies suicidal/homicidal ideation and paranoia.  Patient has outpatient services at [facility] in [facility].  During interview patient observed with repetitive movement of her hands from rolling movement with fingers to patting.  Patient is alert and oriented x 4, calm and cooperative.    Informed patient that she may be having Drug Induced Extrapyramidal Symptoms related to her medication.  Will give her Cogentin 2 mg Im and then start 1 mg Bid.   Will also re-Telepsych to assess if there is a decrease in the racing thoughts and restlessness.        Past Psychiatric History: Major Depression and Psychosis.  Outpatient services with [facility]  Risk to Self: Suicidal Ideation: No Suicidal Intent: No Is patient at risk for suicide?: No Suicidal Plan?: No Access to Means: No What has been your use of drugs/alcohol within the last 12 months?: none Intentional Self Injurious Behavior: Cutting Comment - Self Injurious Behavior: pt reports cutting herself one time a month ago Risk to Others: Homicidal Ideation: No Thoughts of Harm to Others: No Current Homicidal Intent: No Current Homicidal Plan: No Access to Homicidal Means: No History of harm to others?: No Assessment of Violence: None Noted Does patient have access to weapons?: No Criminal Charges Pending?: No Does patient have a court date: No Prior Inpatient Therapy: Prior Inpatient Therapy: Yes Prior Therapy Dates: several admissions Prior Therapy Facilty/Provider(s): several different facilities, incl [facility] Reason for Treatment: anxiety; depression Prior Outpatient Therapy: Prior Outpatient Therapy: No Does patient have an ACCT team?: No Does patient have Intensive In-House Services?  : No Does patient have [facility] services? : No Does patient have P4CC services?: No  Past Medical History:  Past Medical History  Diagnosis Date  . Hypertension   . Anxiety   . MI (myocardial infarction) ([place]) 10/2015    elevated troponin, likely due to vasospasm, clean coronary arteries on cath  . Chronic pelvic pain in female   . Chronic abdominal pain   . History of cardiac catheterization 11/06/2015    normal coronary arteries  .  History of echocardiogram 11/06/2015    normal  . GERD (gastroesophageal reflux disease)   . Panic attack   . Depression     Past Surgical History  Procedure Laterality Date  . Cesarean section    . Cardiac catheterization N/A 11/06/2015    Procedure:  Left Heart Cath and Coronary Angiography;  Surgeon: Sanda Klein, MD;  Location: [facility];  Service: Cardiovascular;  Laterality: N/A;  . No past surgeries     Family History: History reviewed. No pertinent family history. Family Psychiatric  History: Denies Social History:  History  Alcohol Use No     History  Drug Use No    Social History   Social History  . Marital Status: Single    Spouse Name: N/A  . Number of Children: N/A  . Years of Education: N/A   Social History Main Topics  . Smoking status: Current Every Day Smoker -- 0.50 packs/day for 5 years    Types: Cigarettes  . Smokeless tobacco: None  . Alcohol Use: No  . Drug Use: No  . Sexual Activity: Yes   Other Topics Concern  . None   Social History Narrative   Additional Social History:    Allergies:   Allergies  Allergen Reactions  . Ciprofloxacin Anaphylaxis, Hives and Rash  . Penicillins Anaphylaxis, Hives, Shortness Of Breath, Swelling and Rash    Has patient had a PCN reaction causing immediate rash, facial/tongue/throat swelling, SOB or lightheadedness with hypotension: Yes Has patient had a PCN reaction causing severe rash involving mucus membranes or skin necrosis: No Has patient had a PCN reaction that required hospitalization No Has patient had a PCN reaction occurring within the last 10 years: No If all of the above answers are "NO", then may proceed with Cephalosporin use.   Marland Kitchen Amoxicillin   . Codeine Nausea And Vomiting  . Metronidazole     Other reaction(s): Other - See Comments "All jittery and burning"  . Sulfa Antibiotics   . Toradol [Ketorolac Tromethamine]     anaphalaxis     Labs:  Results for orders placed or performed during the hospital encounter of 02/12/16 (from the past 48 hour(s))  Rapid urine drug screen (hospital performed)     Status: Abnormal   Collection Time: 02/12/16  3:38 PM  Result Value Ref Range   Opiates NONE DETECTED NONE DETECTED   Cocaine  NONE DETECTED NONE DETECTED   Benzodiazepines POSITIVE (A) NONE DETECTED   Amphetamines NONE DETECTED NONE DETECTED   Tetrahydrocannabinol NONE DETECTED NONE DETECTED   Barbiturates NONE DETECTED NONE DETECTED    Comment:        DRUG SCREEN FOR MEDICAL PURPOSES ONLY.  IF CONFIRMATION IS NEEDED FOR ANY PURPOSE, NOTIFY LAB WITHIN 5 DAYS.        LOWEST DETECTABLE LIMITS FOR URINE DRUG SCREEN Drug Class       Cutoff (ng/mL) Amphetamine      1000 Barbiturate      200 Benzodiazepine   993 Tricyclics       570 Opiates          300 Cocaine          300 THC              50   Comprehensive metabolic panel     Status: Abnormal   Collection Time: 02/12/16  3:49 PM  Result Value Ref Range   Sodium 138 135 - 145 mmol/L   Potassium 3.0 (L) 3.5 -  5.1 mmol/L   Chloride 104 101 - 111 mmol/L   CO2 22 22 - 32 mmol/L   Glucose, Bld 172 (H) 65 - 99 mg/dL   BUN <5 (L) 6 - 20 mg/dL   Creatinine, Ser 0.79 0.44 - 1.00 mg/dL   Calcium 9.4 8.9 - 10.3 mg/dL   Total Protein 7.2 6.5 - 8.1 g/dL   Albumin 4.3 3.5 - 5.0 g/dL   AST 35 15 - 41 U/L   ALT 26 14 - 54 U/L   Alkaline Phosphatase 94 38 - 126 U/L   Total Bilirubin 0.7 0.3 - 1.2 mg/dL   GFR calc non Af Amer >60 >60 mL/min   GFR calc Af Amer >60 >60 mL/min    Comment: (NOTE) The eGFR has been calculated using the CKD EPI equation. This calculation has not been validated in all clinical situations. eGFR's persistently <60 mL/min signify possible Chronic Kidney Disease.    Anion gap 12 5 - 15  Ethanol     Status: None   Collection Time: 02/12/16  3:49 PM  Result Value Ref Range   Alcohol, Ethyl (B) <5 <5 mg/dL    Comment:        LOWEST DETECTABLE LIMIT FOR SERUM ALCOHOL IS 5 mg/dL FOR MEDICAL PURPOSES ONLY   Salicylate level     Status: None   Collection Time: 02/12/16  3:49 PM  Result Value Ref Range   Salicylate Lvl <1.0 2.8 - 30.0 mg/dL  Acetaminophen level     Status: Abnormal   Collection Time: 02/12/16  3:49 PM  Result Value  Ref Range   Acetaminophen (Tylenol), Serum <10 (L) 10 - 30 ug/mL    Comment:        THERAPEUTIC CONCENTRATIONS VARY SIGNIFICANTLY. A RANGE OF 10-30 ug/mL MAY BE AN EFFECTIVE CONCENTRATION FOR MANY PATIENTS. HOWEVER, SOME ARE BEST TREATED AT CONCENTRATIONS OUTSIDE THIS RANGE. ACETAMINOPHEN CONCENTRATIONS >150 ug/mL AT 4 HOURS AFTER INGESTION AND >50 ug/mL AT 12 HOURS AFTER INGESTION ARE OFTEN ASSOCIATED WITH TOXIC REACTIONS.   cbc     Status: Abnormal   Collection Time: 02/12/16  3:49 PM  Result Value Ref Range   WBC 14.5 (H) 4.0 - 10.5 K/uL   RBC 4.93 3.87 - 5.11 MIL/uL   Hemoglobin 14.9 12.0 - 15.0 g/dL   HCT 45.8 36.0 - 46.0 %   MCV 92.9 78.0 - 100.0 fL   MCH 30.2 26.0 - 34.0 pg   MCHC 32.5 30.0 - 36.0 g/dL   RDW 13.7 11.5 - 15.5 %   Platelets 295 150 - 400 K/uL  Troponin I     Status: Abnormal   Collection Time: 02/12/16  3:49 PM  Result Value Ref Range   Troponin I 0.51 (HH) <0.03 ng/mL    Comment: CRITICAL RESULT CALLED TO, READ BACK BY AND VERIFIED WITH: BETHEL,S ON 02/12/16 AT 1835 BY LOY,C   Troponin I     Status: Abnormal   Collection Time: 02/12/16 10:49 PM  Result Value Ref Range   Troponin I 0.53 (HH) <0.03 ng/mL    Comment: CRITICAL VALUE NOTED.  VALUE IS CONSISTENT WITH PREVIOUSLY REPORTED AND CALLED VALUE.  Troponin I     Status: Abnormal   Collection Time: 02/13/16  8:50 AM  Result Value Ref Range   Troponin I 0.51 (HH) <0.03 ng/mL    Comment: CRITICAL VALUE NOTED.  VALUE IS CONSISTENT WITH PREVIOUSLY REPORTED AND CALLED VALUE.    Current Facility-Administered Medications  Medication Dose Route Frequency Provider Last  Rate Last Dose  . acetaminophen (TYLENOL) tablet 1,000 mg  1,000 mg Oral Q6H PRN Forde Dandy, MD   1,000 mg at 02/13/16 1442  . benztropine (COGENTIN) tablet 1 mg  1 mg Oral BID Shuvon B Rankin, NP   1 mg at 02/13/16 1253  . budesonide (PULMICORT) nebulizer solution 0.25 mg  2 mL Inhalation PRN Nat Christen, MD      . busPIRone (BUSPAR)  tablet 10 mg  10 mg Oral TID Dorie Rank, MD   10 mg at 02/13/16 0941  . clindamycin (CLEOCIN) capsule 300 mg  300 mg Oral Q8H Nat Christen, MD   300 mg at 02/13/16 1442  . clonazePAM (KLONOPIN) tablet 0.5 mg  0.5 mg Oral TID PRN Nat Christen, MD   0.5 mg at 02/13/16 0724  . docusate sodium (COLACE) capsule 100 mg  100 mg Oral BID Nat Christen, MD   100 mg at 02/12/16 2227  . hydrOXYzine (ATARAX/VISTARIL) tablet 25 mg  25 mg Oral Q6H PRN Nat Christen, MD   25 mg at 02/13/16 0941  . LORazepam (ATIVAN) tablet 1 mg  1 mg Oral TID Forde Dandy, MD   Stopped at 02/13/16 1120  . metoprolol tartrate (LOPRESSOR) tablet 25 mg  25 mg Oral BID Nat Christen, MD   25 mg at 02/12/16 2223  . mirtazapine (REMERON) tablet 30 mg  30 mg Oral QHS Nat Christen, MD   30 mg at 02/12/16 2224  . pantoprazole (PROTONIX) EC tablet 40 mg  40 mg Oral Daily Nat Christen, MD   40 mg at 02/13/16 0941  . QUEtiapine (SEROQUEL) tablet 300 mg  300 mg Oral Daily Forde Dandy, MD   Stopped at 02/13/16 1119  . sertraline (ZOLOFT) tablet 100 mg  100 mg Oral Daily Forde Dandy, MD   100 mg at 02/13/16 1152   Current Outpatient Prescriptions  Medication Sig Dispense Refill  . beclomethasone (QVAR) 80 MCG/ACT inhaler Inhale 2 puffs into the lungs 2 (two) times daily.    . busPIRone (BUSPAR) 10 MG tablet Take 10 mg by mouth 3 (three) times daily.    . clonazePAM (KLONOPIN) 0.5 MG tablet Take 1 tablet by mouth 3 (three) times daily as needed for anxiety.   0  . docusate sodium (COLACE) 100 MG capsule Take 100 mg by mouth 2 (two) times daily.    . hydrOXYzine (ATARAX/VISTARIL) 25 MG tablet Take 1 tablet (25 mg total) by mouth every 6 (six) hours as needed for anxiety. 30 tablet 0  . LORazepam (ATIVAN) 1 MG tablet Take 1 mg by mouth 3 (three) times daily.    . metoprolol tartrate (LOPRESSOR) 25 MG tablet Take 1 tablet (25 mg total) by mouth 2 (two) times daily. 60 tablet 0  . mirtazapine (REMERON) 30 MG tablet Take 30 mg by mouth at bedtime.    .  pantoprazole (PROTONIX) 40 MG tablet Take 1 tablet (40 mg total) by mouth daily. 30 tablet 0  . QUEtiapine (SEROQUEL) 300 MG tablet Take 300 mg by mouth daily.    . sertraline (ZOLOFT) 100 MG tablet Take 100 mg by mouth daily.    . [DISCONTINUED] dicyclomine (BENTYL) 20 MG tablet Take 1 tablet (20 mg total) by mouth every 6 (six) hours as needed for spasms (abdominal cramping). (Patient not taking: Reported on 12/19/2015) 15 tablet 0    Musculoskeletal: Strength & Muscle Tone: within normal limits Gait & Station: normal Patient leans: N/A  Psychiatric Specialty Exam: Physical Exam  Constitutional: She is oriented to person, place, and time.  Respiratory: Effort normal.  Neurological: She is alert and oriented to person, place, and time.  Psychiatric: Her speech is normal and behavior is normal. Her mood appears anxious. Thought content is not paranoid and not delusional. Cognition and memory are normal. She expresses impulsivity. She exhibits a depressed mood. She expresses no homicidal and no suicidal ideation.    ROS  Blood pressure 105/63, pulse 70, temperature 97.6 F (36.4 C), temperature source Oral, resp. rate 18, height 5' 3" (1.6 m), weight 69.4 kg (153 lb), last menstrual period 01/29/2016, SpO2 96 %.Body mass index is 27.11 kg/(m^2).  General Appearance: Casual  Eye Contact:  Good  Speech:  Clear and Coherent and Normal Rate  Volume:  Normal  Mood:  Anxious and Depressed  Affect:  Depressed  Thought Process:  Coherent and Goal Directed  Orientation:  Full (Time, Place, and Person)  Thought Content:  Logical and Hallucinations: Auditory  Suicidal Thoughts:  No  Homicidal Thoughts:  No  Memory:  Immediate;   Good Recent;   Good Remote;   Good  Judgement:  Fair  Insight:  Fair  Psychomotor Activity:  EPS and Restlessness  Concentration:  Concentration: Fair and Attention Span: Fair  Recall:  Good  Fund of Knowledge:  Fair  Language:  Good  Akathisia:  Yes  Handed:   Right  AIMS (if indicated):     Assets:  Communication Skills Desire for Improvement Housing Social Support  ADL's:  Intact  Cognition:  WNL  Sleep:        Treatment Plan Summary: Plan Observation.  Reassess after IM Cogentin.  Disposition: Reassess after Cogentin  Rankin, Shuvon, NP 02/13/2016 3:06 PM   Addendum:  Spoke with Marliss Czar wallace RN report that patient is doing better.  Decrease in patient's movements but that patient continues to ask for Ativan, Klonopin, and obsessed with medication.  Informed that would keep over night and reassess tomorrow.  Looking for decrease in EPS symptoms.    Shuvon B. Rankin FNP-BC 3:01 PM

## 2016-02-14 ENCOUNTER — Encounter (HOSPITAL_COMMUNITY): Payer: Self-pay | Admitting: Cardiology

## 2016-02-14 DIAGNOSIS — F419 Anxiety disorder, unspecified: Secondary | ICD-10-CM | POA: Diagnosis not present

## 2016-02-14 DIAGNOSIS — G2571 Drug induced akathisia: Secondary | ICD-10-CM | POA: Diagnosis not present

## 2016-02-14 DIAGNOSIS — G4700 Insomnia, unspecified: Secondary | ICD-10-CM | POA: Insufficient documentation

## 2016-02-14 MED ORDER — BENZTROPINE MESYLATE 1 MG PO TABS: 1.0000 mg | ORAL_TABLET | Freq: Every day | ORAL | 0 refills | Status: DC

## 2016-02-14 NOTE — ED Notes (Signed)
Pt called out wanting something for anxiety, medications given and pt informed breakfast will be served in a little over an hour

## 2016-02-14 NOTE — Consult Note (Signed)
Telepsych Consultation   Reason for Consult:  "Depression, anxiety, and medicine not working" Referring Physician:  EDP Patient Identification: Melissa Butler MRN:  [id number] Principal Diagnosis: Drug induced akathisia Diagnosis:   Patient Active Problem List   Diagnosis Date Noted  . Insomnia [G47.00]   . Anxiety [F41.9]   . 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 Panic disorder [F41.0] 01/04/2016  . MDD (major depressive disorder), recurrent severe, without psychosis ([place]) [F33.2] 11/15/2015  . NSTEMI (non-ST elevated myocardial infarction) ([place]) [I21.4] 11/06/2015  . Normal coronary arteries 11/06/15 [Z03.89] 11/06/2015    Total Time spent with patient: 30 minutes  Subjective:   Melissa Butler is a 40 y.o. female patient admitted to [facility] related IVC by family reporting of worsening depression, anxiety, and stating that her medication is not working.  HPI:  Melissa Butler 40 y.o. female patient seen by this provider and chart reviewed 02/14/16.  On evaluation:  Melissa Butler reports that she is having worsening depression and feeling that her medications is not working.  Patient reports that she started Saphris 2 weeks ago and that is when she had her first seizure.  States since then she has had 2 more seizures and feel restless and is unable to stop moving.  Patient states that her mind is also racing.  Patient states that she is hearing voices but that is a base line for her.  Reports that the voices are telling her about her medications and doesn't tell her negative things.  Patient denies suicidal/homicidal ideation and paranoia.  Patient has outpatient services at [facility] in [facility].  During interview patient observed with repetitive movement of her hands from rolling movement with fingers to patting.  Patient is alert and oriented x 4, calm and cooperative.    Informed patient that she may be having Drug Induced Extrapyramidal Symptoms related to her medication.  Will give her  Cogentin 2 mg Im and then start 1 mg Bid.  Will also re-Telepsych to assess if there is a decrease in the racing thoughts and restlessness.     Re-evaluation 02/14/16:  Patient sitting on bed during interview.  Patient appears to be less restless today than yesterday.  Did not notice any excessive movement of patients arms and hands today.  Patient also states that she is not sleeping and is asking for medications to help her sleep.  Informed patient that she could follow up with her outpatient provider for more medication adjustments.  Patient got to [facility] for outpatient services.  Patient requesting medications for her nerves and medication to help with sleep.  Went over the current medications with patient and informed that did not need to have any additional medications added.  Informed patient to continue with current medications and to follow up with [facility] first thing Monday morning to have further medication adjustments done.   At this time patient denies suicidal/homicidal ideation, psychosis, and paranoia.    [facility] [place] Social worker spoke with the patient's mother and informed to follow up with [facility].  Mother had questions about seizure instructed to speak with EDP or neurologist       Past Psychiatric History: Major Depression and Psychosis.  Outpatient services with [facility]  Risk to Self: Suicidal Ideation: No Suicidal Intent: No Is patient at risk for suicide?: No Suicidal Plan?: No Access to Means: No What has been your use of drugs/alcohol within the last 12 months?: none Intentional Self Injurious Behavior: Cutting Comment - Self Injurious Behavior: pt  reports cutting herself one time a month ago Risk to Others: Homicidal Ideation: No Thoughts of Harm to Others: No Current Homicidal Intent: No Current Homicidal Plan: No Access to Homicidal Means: No History of harm to others?: No Assessment of Violence: None Noted Does patient have access to  weapons?: No Criminal Charges Pending?: No Does patient have a court date: No Prior Inpatient Therapy: Prior Inpatient Therapy: Yes Prior Therapy Dates: several admissions Prior Therapy Facilty/Provider(s): several different facilities, incl [facility] Reason for Treatment: anxiety; depression Prior Outpatient Therapy: Prior Outpatient Therapy: No Does patient have an ACCT team?: No Does patient have Intensive In-House Services?  : No Does patient have [facility] services? : No Does patient have P4CC services?: No  Past Medical History:  Past Medical History:  Diagnosis Date  . Anxiety   . Chronic abdominal pain   . Chronic pelvic pain in female   . Depression   . GERD (gastroesophageal reflux disease)   . History of cardiac catheterization 11/06/2015   normal coronary arteries  . History of echocardiogram 11/06/2015   normal  . Hypertension   . MI (myocardial infarction) ([place]) 10/2015   elevated troponin, likely due to vasospasm, clean coronary arteries on cath  . Panic attack     Past Surgical History:  Procedure Laterality Date  . CARDIAC CATHETERIZATION N/A 11/06/2015   Procedure: Left Heart Cath and Coronary Angiography;  Surgeon: Sanda Klein, MD;  Location: [facility];  Service: Cardiovascular;  Laterality: N/A;  . CESAREAN SECTION    . NO PAST SURGERIES     Family History: History reviewed. No pertinent family history. Family Psychiatric  History: Denies Social History:  History  Alcohol Use No     History  Drug Use No    Social History   Social History  . Marital status: Single    Spouse name: N/A  . Number of children: N/A  . Years of education: N/A   Social History Main Topics  . Smoking status: Current Every Day Smoker    Packs/day: 0.50    Years: 5.00    Types: Cigarettes  . Smokeless tobacco: None  . Alcohol use No  . Drug use: No  . Sexual activity: Yes   Other Topics Concern  . None   Social History Narrative  . None   Additional  Social History:    Allergies:   Allergies  Allergen Reactions  . Ciprofloxacin Anaphylaxis, Hives and Rash  . Penicillins Anaphylaxis, Hives, Shortness Of Breath, Swelling and Rash    Has patient had a PCN reaction causing immediate rash, facial/tongue/throat swelling, SOB or lightheadedness with hypotension: Yes Has patient had a PCN reaction causing severe rash involving mucus membranes or skin necrosis: No Has patient had a PCN reaction that required hospitalization No Has patient had a PCN reaction occurring within the last 10 years: No If all of the above answers are "NO", then may proceed with Cephalosporin use.   Marland Kitchen Amoxicillin   . Codeine Nausea And Vomiting  . Metronidazole     Other reaction(s): Other - See Comments "All jittery and burning"  . Sulfa Antibiotics   . Toradol [Ketorolac Tromethamine]     anaphalaxis     Labs:  Results for orders placed or performed during the hospital encounter of 02/12/16 (from the past 48 hour(s))  Rapid urine drug screen (hospital performed)     Status: Abnormal   Collection Time: 02/12/16  3:38 PM  Result Value Ref Range   Opiates  NONE DETECTED NONE DETECTED   Cocaine NONE DETECTED NONE DETECTED   Benzodiazepines POSITIVE (A) NONE DETECTED   Amphetamines NONE DETECTED NONE DETECTED   Tetrahydrocannabinol NONE DETECTED NONE DETECTED   Barbiturates NONE DETECTED NONE DETECTED    Comment:        DRUG SCREEN FOR MEDICAL PURPOSES ONLY.  IF CONFIRMATION IS NEEDED FOR ANY PURPOSE, NOTIFY LAB WITHIN 5 DAYS.        LOWEST DETECTABLE LIMITS FOR URINE DRUG SCREEN Drug Class       Cutoff (ng/mL) Amphetamine      1000 Barbiturate      200 Benzodiazepine   500 Tricyclics       370 Opiates          300 Cocaine          300 THC              50   Comprehensive metabolic panel     Status: Abnormal   Collection Time: 02/12/16  3:49 PM  Result Value Ref Range   Sodium 138 135 - 145 mmol/L   Potassium 3.0 (L) 3.5 - 5.1 mmol/L   Chloride  104 101 - 111 mmol/L   CO2 22 22 - 32 mmol/L   Glucose, Bld 172 (H) 65 - 99 mg/dL   BUN <5 (L) 6 - 20 mg/dL   Creatinine, Ser 0.79 0.44 - 1.00 mg/dL   Calcium 9.4 8.9 - 10.3 mg/dL   Total Protein 7.2 6.5 - 8.1 g/dL   Albumin 4.3 3.5 - 5.0 g/dL   AST 35 15 - 41 U/L   ALT 26 14 - 54 U/L   Alkaline Phosphatase 94 38 - 126 U/L   Total Bilirubin 0.7 0.3 - 1.2 mg/dL   GFR calc non Af Amer >60 >60 mL/min   GFR calc Af Amer >60 >60 mL/min    Comment: (NOTE) The eGFR has been calculated using the CKD EPI equation. This calculation has not been validated in all clinical situations. eGFR's persistently <60 mL/min signify possible Chronic Kidney Disease.    Anion gap 12 5 - 15  Ethanol     Status: None   Collection Time: 02/12/16  3:49 PM  Result Value Ref Range   Alcohol, Ethyl (B) <5 <5 mg/dL    Comment:        LOWEST DETECTABLE LIMIT FOR SERUM ALCOHOL IS 5 mg/dL FOR MEDICAL PURPOSES ONLY   Salicylate level     Status: None   Collection Time: 02/12/16  3:49 PM  Result Value Ref Range   Salicylate Lvl <4.8 2.8 - 30.0 mg/dL  Acetaminophen level     Status: Abnormal   Collection Time: 02/12/16  3:49 PM  Result Value Ref Range   Acetaminophen (Tylenol), Serum <10 (L) 10 - 30 ug/mL    Comment:        THERAPEUTIC CONCENTRATIONS VARY SIGNIFICANTLY. A RANGE OF 10-30 ug/mL MAY BE AN EFFECTIVE CONCENTRATION FOR MANY PATIENTS. HOWEVER, SOME ARE BEST TREATED AT CONCENTRATIONS OUTSIDE THIS RANGE. ACETAMINOPHEN CONCENTRATIONS >150 ug/mL AT 4 HOURS AFTER INGESTION AND >50 ug/mL AT 12 HOURS AFTER INGESTION ARE OFTEN ASSOCIATED WITH TOXIC REACTIONS.   cbc     Status: Abnormal   Collection Time: 02/12/16  3:49 PM  Result Value Ref Range   WBC 14.5 (H) 4.0 - 10.5 K/uL   RBC 4.93 3.87 - 5.11 MIL/uL   Hemoglobin 14.9 12.0 - 15.0 g/dL   HCT 45.8 36.0 - 46.0 %   MCV  92.9 78.0 - 100.0 fL   MCH 30.2 26.0 - 34.0 pg   MCHC 32.5 30.0 - 36.0 g/dL   RDW 13.7 11.5 - 15.5 %   Platelets 295 150  - 400 K/uL  Troponin I     Status: Abnormal   Collection Time: 02/12/16  3:49 PM  Result Value Ref Range   Troponin I 0.51 (HH) <0.03 ng/mL    Comment: CRITICAL RESULT CALLED TO, READ BACK BY AND VERIFIED WITH: BETHEL,S ON 02/12/16 AT 1835 BY LOY,C   Troponin I     Status: Abnormal   Collection Time: 02/12/16 10:49 PM  Result Value Ref Range   Troponin I 0.53 (HH) <0.03 ng/mL    Comment: CRITICAL VALUE NOTED.  VALUE IS CONSISTENT WITH PREVIOUSLY REPORTED AND CALLED VALUE.  Troponin I     Status: Abnormal   Collection Time: 02/13/16  8:50 AM  Result Value Ref Range   Troponin I 0.51 (HH) <0.03 ng/mL    Comment: CRITICAL VALUE NOTED.  VALUE IS CONSISTENT WITH PREVIOUSLY REPORTED AND CALLED VALUE.    Current Facility-Administered Medications  Medication Dose Route Frequency Provider Last Rate Last Dose  . acetaminophen (TYLENOL) tablet 1,000 mg  1,000 mg Oral Q6H PRN Forde Dandy, MD   1,000 mg at 02/13/16 1442  . benztropine (COGENTIN) tablet 1 mg  1 mg Oral BID Shuvon B Rankin, NP   1 mg at 02/14/16 0905  . budesonide (PULMICORT) nebulizer solution 0.25 mg  2 mL Inhalation PRN Nat Christen, MD      . busPIRone (BUSPAR) tablet 10 mg  10 mg Oral TID Dorie Rank, MD   10 mg at 02/14/16 1504  . clindamycin (CLEOCIN) capsule 300 mg  300 mg Oral Q8H Nat Christen, MD   300 mg at 02/14/16 1504  . clonazePAM (KLONOPIN) tablet 0.5 mg  0.5 mg Oral TID PRN Nat Christen, MD   0.5 mg at 02/14/16 1206  . docusate sodium (COLACE) capsule 100 mg  100 mg Oral BID Nat Christen, MD   Stopped at 02/13/16 2221  . hydrOXYzine (ATARAX/VISTARIL) tablet 25 mg  25 mg Oral Q6H PRN Nat Christen, MD   25 mg at 02/14/16 0603  . LORazepam (ATIVAN) tablet 1 mg  1 mg Oral TID Forde Dandy, MD   1 mg at 02/14/16 1504  . metoprolol tartrate (LOPRESSOR) tablet 25 mg  25 mg Oral BID Nat Christen, MD   25 mg at 02/14/16 0906  . mirtazapine (REMERON) tablet 30 mg  30 mg Oral QHS Nat Christen, MD   30 mg at 02/13/16 2149  . pantoprazole  (PROTONIX) EC tablet 40 mg  40 mg Oral Daily Nat Christen, MD   40 mg at 02/14/16 0905  . QUEtiapine (SEROQUEL) tablet 300 mg  300 mg Oral Daily Forde Dandy, MD   300 mg at 02/14/16 0906  . sertraline (ZOLOFT) tablet 100 mg  100 mg Oral Daily Forde Dandy, MD   100 mg at 02/14/16 0906  . traZODone (DESYREL) tablet 100 mg  100 mg Oral QHS PRN Pattricia Boss, MD   100 mg at 02/13/16 2231   Current Outpatient Prescriptions  Medication Sig Dispense Refill  . beclomethasone (QVAR) 80 MCG/ACT inhaler Inhale 2 puffs into the lungs 2 (two) times daily.    . busPIRone (BUSPAR) 10 MG tablet Take 10 mg by mouth 3 (three) times daily.    . clonazePAM (KLONOPIN) 0.5 MG tablet Take 1 tablet by mouth 3 (three)  times daily as needed for anxiety.   0  . docusate sodium (COLACE) 100 MG capsule Take 100 mg by mouth 2 (two) times daily.    . hydrOXYzine (ATARAX/VISTARIL) 25 MG tablet Take 1 tablet (25 mg total) by mouth every 6 (six) hours as needed for anxiety. 30 tablet 0  . LORazepam (ATIVAN) 1 MG tablet Take 1 mg by mouth 3 (three) times daily.    . metoprolol tartrate (LOPRESSOR) 25 MG tablet Take 1 tablet (25 mg total) by mouth 2 (two) times daily. 60 tablet 0  . mirtazapine (REMERON) 30 MG tablet Take 30 mg by mouth at bedtime.    . pantoprazole (PROTONIX) 40 MG tablet Take 1 tablet (40 mg total) by mouth daily. 30 tablet 0  . QUEtiapine (SEROQUEL) 300 MG tablet Take 300 mg by mouth daily.    . sertraline (ZOLOFT) 100 MG tablet Take 100 mg by mouth daily.      Musculoskeletal: Strength & Muscle Tone: within normal limits Gait & Station: normal Patient leans: N/A  Psychiatric Specialty Exam: Physical Exam  Constitutional: She is oriented to person, place, and time.  Patient appears to be less restless today.  Did not notice excessive movement of arms and hands during this interview  Neck: Normal range of motion.  Respiratory: Effort normal.  Musculoskeletal: Normal range of motion.  Neurological: She  is alert and oriented to person, place, and time.  Psychiatric: Her speech is normal and behavior is normal. Her mood appears anxious. Thought content is not paranoid and not delusional. Cognition and memory are normal. She expresses impulsivity. She exhibits a depressed mood. She expresses no homicidal and no suicidal ideation.    Review of Systems  Constitutional:       Continues to complain of restlessness  Psychiatric/Behavioral: Hallucinations: Denies. Memory loss: States that she is having some problems with her memeory since the seizures. Substance abuse: Denies. Suicidal ideas: Denies. The patient is nervous/anxious and has insomnia.     Blood pressure 108/65, pulse 67, temperature 98.6 F (37 C), resp. rate 16, height '5\' 3"'  (1.6 m), weight 69.4 kg (153 lb), last menstrual period 01/29/2016, SpO2 97 %.Body mass index is 27.1 kg/m.  General Appearance: Casual  Eye Contact:  Good  Speech:  Clear and Coherent and Normal Rate  Volume:  Normal  Mood:  Anxious  Affect:  Congruent  Thought Process:  Coherent and Goal Directed  Orientation:  Full (Time, Place, and Person)  Thought Content:  Logical and Hallucinations: Auditory  Suicidal Thoughts:  No  Homicidal Thoughts:  No  Memory:  Immediate;   Good Recent;   Good Remote;   Good  Judgement:  Fair  Insight:  Fair  Psychomotor Activity:  EPS and Restlessness, Improved  Concentration:  Concentration: Fair and Attention Span: Fair  Recall:  Good  Fund of Knowledge:  Fair  Language:  Good  Akathisia:  Yes  Handed:  Right  AIMS (if indicated):     Assets:  Communication Skills Desire for Improvement Housing Social Support  ADL's:  Intact  Cognition:  WNL  Sleep:        Treatment Plan Summary: Plan Recommend Discharge home to follow up with primary psyc outpatient provider.    Disposition: No evidence of imminent risk to self or others at present.   Patient does not meet criteria for psychiatric inpatient  admission. Recommend Discharge home.  Patient to continue current medications adding Cogentin 1 mg Bid.  Follow up with [facility] first  thing Monday morning.   Rankin, Shuvon, NP 02/14/2016 3:12 PM Shuvon B. Rankin FNP-BC 3:01 PM

## 2016-02-14 NOTE — ED Notes (Signed)
Pt states she is continuing to have chest pain.  States it is the same pain she has been having.  Pt no distress.  Pt states it may be anxiety.

## 2016-02-14 NOTE — ED Notes (Signed)
Spoke with Shavone at [facility] and looking at discharging pt.  Shavone calling mother.

## 2016-02-14 NOTE — Discharge Instructions (Signed)
Follow up with [facility] as planned by behavioral health team.

## 2016-02-14 NOTE — ED Notes (Signed)
Pt states she does not want to go home.  Wants to stay in the ER overnight and that her mother is out of town and can not come get her.

## 2016-02-14 NOTE — ED Notes (Signed)
D/c'd home with mother.

## 2016-02-14 NOTE — ED Notes (Signed)
Spoke with mother and states she will come pick up the pt.

## 2016-02-16 ENCOUNTER — Encounter (HOSPITAL_COMMUNITY): Payer: Self-pay | Admitting: Emergency Medicine

## 2016-02-16 ENCOUNTER — Emergency Department (HOSPITAL_COMMUNITY)
Admission: EM | Admit: 2016-02-16 | Discharge: 2016-02-16 | Disposition: A | Discharge status: Home / Self Care | Payer: Medicaid Other | Attending: Emergency Medicine | Admitting: Emergency Medicine

## 2016-02-16 DIAGNOSIS — F418 Other specified anxiety disorders: Secondary | ICD-10-CM | POA: Insufficient documentation

## 2016-02-16 DIAGNOSIS — Z79899 Other long term (current) drug therapy: Secondary | ICD-10-CM | POA: Insufficient documentation

## 2016-02-16 DIAGNOSIS — R4182 Altered mental status, unspecified: Secondary | ICD-10-CM | POA: Insufficient documentation

## 2016-02-16 DIAGNOSIS — F17210 Nicotine dependence, cigarettes, uncomplicated: Secondary | ICD-10-CM | POA: Diagnosis not present

## 2016-02-16 DIAGNOSIS — F419 Anxiety disorder, unspecified: Secondary | ICD-10-CM

## 2016-02-16 DIAGNOSIS — I10 Essential (primary) hypertension: Secondary | ICD-10-CM | POA: Insufficient documentation

## 2016-02-16 DIAGNOSIS — I252 Old myocardial infarction: Secondary | ICD-10-CM | POA: Insufficient documentation

## 2016-02-16 LAB — BASIC METABOLIC PANEL
Anion gap: 4 — ABNORMAL LOW (ref 5–15)
BUN: 5 mg/dL — AB (ref 6–20)
CHLORIDE: 106 mmol/L (ref 101–111)
CO2: 25 mmol/L (ref 22–32)
CREATININE: 0.69 mg/dL (ref 0.44–1.00)
Calcium: 8.7 mg/dL — ABNORMAL LOW (ref 8.9–10.3)
GFR calc Af Amer: >60 mL/min (ref >60)
GFR calc non Af Amer: >60 mL/min (ref >60)
GLUCOSE: 108 mg/dL — AB (ref 65–99)
POTASSIUM: 3.5 mmol/L (ref 3.5–5.1)
Sodium: 135 mmol/L (ref 135–145)

## 2016-02-16 LAB — RAPID URINE DRUG SCREEN, HOSP PERFORMED
AMPHETAMINES: NOT DETECTED
BENZODIAZEPINES: POSITIVE — AB
Barbiturates: NOT DETECTED
Cocaine: NOT DETECTED
Opiates: NOT DETECTED
TETRAHYDROCANNABINOL: NOT DETECTED

## 2016-02-16 LAB — CBC WITH DIFFERENTIAL/PLATELET
Basophils Absolute: 0 10*3/uL (ref 0.0–0.1)
Basophils Relative: 0 %
EOS ABS: 0 10*3/uL (ref 0.0–0.7)
Eosinophils Relative: 0 %
HEMATOCRIT: 42.8 % (ref 36.0–46.0)
HEMOGLOBIN: 13.6 g/dL (ref 12.0–15.0)
LYMPHS ABS: 1.8 10*3/uL (ref 0.7–4.0)
LYMPHS PCT: 15 %
MCH: 29.9 pg (ref 26.0–34.0)
MCHC: 31.8 g/dL (ref 30.0–36.0)
MCV: 94.1 fL (ref 78.0–100.0)
MONOS PCT: 5 %
Monocytes Absolute: 0.6 10*3/uL (ref 0.1–1.0)
NEUTROS ABS: 9.7 10*3/uL — AB (ref 1.7–7.7)
NEUTROS PCT: 80 %
Platelets: 254 10*3/uL (ref 150–400)
RBC: 4.55 MIL/uL (ref 3.87–5.11)
RDW: 13.7 % (ref 11.5–15.5)
WBC: 12.1 10*3/uL — AB (ref 4.0–10.5)

## 2016-02-16 LAB — URINALYSIS, ROUTINE W REFLEX MICROSCOPIC
Bilirubin Urine: NEGATIVE
GLUCOSE, UA: NEGATIVE mg/dL
Hgb urine dipstick: NEGATIVE
KETONES UR: NEGATIVE mg/dL
LEUKOCYTES UA: NEGATIVE
Nitrite: NEGATIVE
PH: 6 (ref 5.0–8.0)
Protein, ur: NEGATIVE mg/dL

## 2016-02-16 LAB — ETHANOL: Alcohol, Ethyl (B): <5 mg/dL (ref <5)

## 2016-02-16 LAB — PREGNANCY, URINE: Preg Test, Ur: NEGATIVE

## 2016-02-16 MED ORDER — LORAZEPAM 1 MG PO TABS
1.0000 mg | ORAL_TABLET | Freq: Once | ORAL | Status: AC
  Administered 2016-02-16: 1 mg via ORAL
  Filled 2016-02-16: qty 1

## 2016-02-16 MED ORDER — METOPROLOL TARTRATE 25 MG PO TABS
25.0000 mg | ORAL_TABLET | Freq: Once | ORAL | Status: AC
  Administered 2016-02-16: 25 mg via ORAL
  Filled 2016-02-16: qty 1

## 2016-02-16 NOTE — ED Provider Notes (Signed)
[facility] DEPT Provider Note   CSN: [id number] Arrival date & time: 02/16/16  1300  First Provider Contact:  First MD Initiated Contact with Patient 02/16/16 1705        History   Chief Complaint Chief Complaint  Patient presents with  . Altered Mental Status    HPI Melissa Butler is a 40 y.o. female.  The history is provided by the patient, a parent and a caregiver.  Altered Mental Status   This is a recurrent problem. The problem has not changed since onset.Associated symptoms include confusion. Pertinent negatives include no seizures. Associated symptoms comments: No fever or vomiting . Her past medical history is significant for seizures and psychotropic medication treatment.  patient is here for general health check up She has extensive medical and psych history She has home care from community paramedic She has some confusion ever since evalution for seizures on 7/10 at [facility] She appears anxious at times She thinks she is going to have seizure but has not had one today No recent eval by neurology Due to recent elevated WBC, there was concern for dental abscess No fever/vomiting She has chronic abd/CP    Past Medical History:  Diagnosis Date  . Anxiety   . Chronic abdominal pain   . Chronic pelvic pain in female   . Depression   . GERD (gastroesophageal reflux disease)   . History of cardiac catheterization 11/06/2015   normal coronary arteries  . History of echocardiogram 11/06/2015   normal  . Hypertension   . MI (myocardial infarction) (HCC) 10/2015   elevated troponin, likely due to vasospasm, clean coronary arteries on cath  . Panic attack     Patient Active Problem List   Diagnosis Date Noted  . Insomnia   . Anxiety   . 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 Panic disorder 01/04/2016  . 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 NSTEMI (non-ST elevated myocardial infarction) (HCC) 11/06/2015    . Normal coronary arteries 11/06/15 11/06/2015    Past Surgical History:  Procedure Laterality Date  . CARDIAC CATHETERIZATION N/A 11/06/2015   Procedure: Left Heart Cath and Coronary Angiography;  Surgeon: Thurmon Fair, MD;  [facility];  Service: Cardiovascular;  Laterality: N/A;  . CESAREAN SECTION    . NO PAST SURGERIES      OB History    Gravida Para Term Preterm AB Living             2   SAB TAB Ectopic Multiple Live Births                   Home Medications    Prior to Admission medications   Medication Sig Start Date End Date Taking? Authorizing Provider  beclomethasone (QVAR) 80 MCG/ACT inhaler Inhale 2 puffs into the lungs 2 (two) times daily.   Yes Historical Provider, MD  benztropine (COGENTIN) 1 MG tablet Take 1 tablet (1 mg total) by mouth daily. 02/14/16  Yes Margarita Grizzle, MD  busPIRone (BUSPAR) 10 MG tablet Take 10 mg by mouth 3 (three) times daily.   Yes Historical Provider, MD  clonazePAM (KLONOPIN) 0.5 MG tablet Take 1 tablet by mouth 3 (three) times daily as needed for anxiety.  12/19/15  Yes Historical Provider, MD  docusate sodium (COLACE) 100 MG capsule Take 100 mg by mouth 2 (two) times daily.   Yes Historical Provider, MD  hydrOXYzine (ATARAX/VISTARIL) 25 MG tablet Take 1 tablet (25 mg total)  by mouth every 6 (six) hours as needed for anxiety. 01/09/16  Yes Jimmy Footman, MD  LORazepam (ATIVAN) 1 MG tablet Take 1 mg by mouth 3 (three) times daily.   Yes Historical Provider, MD  metoprolol tartrate (LOPRESSOR) 25 MG tablet Take 1 tablet (25 mg total) by mouth 2 (two) times daily. 01/09/16  Yes Jimmy Footman, MD  mirtazapine (REMERON) 30 MG tablet Take 30 mg by mouth at bedtime.   Yes Historical Provider, MD  pantoprazole (PROTONIX) 40 MG tablet Take 1 tablet (40 mg total) by mouth daily. 01/09/16  Yes Jimmy Footman, MD  QUEtiapine (SEROQUEL) 300 MG tablet Take 300 mg by mouth daily.   Yes Historical Provider,  MD  sertraline (ZOLOFT) 100 MG tablet Take 100 mg by mouth daily.   Yes Historical Provider, MD    Family History History reviewed. No pertinent family history.  Social History Social History  Substance Use Topics  . Smoking status: Current Every Day Smoker    Packs/day: 0.50    Years: 5.00    Types: Cigarettes  . Smokeless tobacco: Never Used  . Alcohol use No     Allergies   Ciprofloxacin; Penicillins; Amoxicillin; Codeine; Metronidazole; Sulfa antibiotics; and Toradol [ketorolac tromethamine]   Review of Systems Review of Systems  Constitutional: Negative for fever.  Cardiovascular:       Chronic CP   Gastrointestinal:       Chronic abd pain  Neurological: Negative for seizures.  Psychiatric/Behavioral: Positive for confusion.  All other systems reviewed and are negative.    Physical Exam Updated Vital Signs BP 141/89 (BP Location: Left Arm)   Pulse 66   Temp 98.2 F (36.8 C) (Oral)   Resp 18   LMP 01/29/2016   SpO2 98%   Physical Exam CONSTITUTIONAL: Well developed/well nourished, mildly anxious HEAD: Normocephalic/atraumatic EYES: EOMI/PERRL ENMT: Mucous membranes moist, no oral swelling, no drooling, poor dentition NECK: supple no meningeal signs SPINE/BACK:entire spine nontender CV: S1/S2 noted, no murmurs/rubs/gallops noted LUNGS: Lungs are clear to auscultation bilaterally, no apparent distress ABDOMEN: soft, nontender, no rebound or guarding, bowel sounds noted throughout abdomen GU:no cva tenderness NEURO: Pt is awake/alert/appropriate, moves all extremitiesx4.  No facial droop.  She appears anxious but answers all questions appropriately EXTREMITIES: pulses normal/equal, full ROM SKIN: warm, color normal PSYCH: anxious   ED Treatments / Results  Labs (all labs ordered are listed, but only abnormal results are displayed) Labs Reviewed  URINALYSIS, ROUTINE W REFLEX MICROSCOPIC (NOT AT [facility]) - Abnormal; Notable for the following:        Result Value   Specific Gravity, Urine <1.005 (*)    All other components within normal limits  CBC WITH DIFFERENTIAL/PLATELET - Abnormal; Notable for the following:    WBC 12.1 (*)    Neutro Abs 9.7 (*)    All other components within normal limits  BASIC METABOLIC PANEL - Abnormal; Notable for the following:    Glucose, Bld 108 (*)    BUN 5 (*)    Calcium 8.7 (*)    Anion gap 4 (*)    All other components within normal limits  URINE RAPID DRUG SCREEN, HOSP PERFORMED - Abnormal; Notable for the following:    Benzodiazepines POSITIVE (*)    All other components within normal limits  PREGNANCY, URINE  ETHANOL    EKG  EKG Interpretation  Date/Time:  Tuesday February 16 2016 18:30:04 EDT Ventricular Rate:  84 PR Interval:    QRS Duration: 87 QT Interval:  400  QTC Calculation: 473 R Axis:   -67 Text Interpretation:  Sinus rhythm 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  Procedures Procedures (including critical care time)  Medications Ordered in ED Medications  LORazepam (ATIVAN) tablet 1 mg (1 mg Oral Given 02/16/16 1746)  metoprolol tartrate (LOPRESSOR) tablet 25 mg (25 mg Oral Given 02/16/16 1746)     Initial Impression / Assessment and Plan / ED Course  I have reviewed the triage vital signs and the nursing notes.  Pertinent labs & imaging results that were available during my care of the patient were reviewed by me and considered in my medical decision making (see chart for details).  Clinical Course    Records from [facility] reviewed She was there for psych admission, and apparently had seizure and ER evaluation at that facility on 7/10 No prior h/o seizures per chart meds were changed per psychiatry Decreased sapharis and started klonopin Pt is requesting home med while waiting, lopressor and ativan  ordered Pt is no longer on sapharis 7:07 PM  Pt stable HR improved BP 157/90 (BP Location: Right Arm)   Pulse 82   Temp 98.2 F (36.8 C) (Oral)   Resp 18   LMP 01/29/2016   SpO2 98%  No acute issue at this time Referred to neurology Mother is with patient at all times She is not significantly altered at this time Brought in by community paramedic for medical eval, will see psych tomorrow  Final Clinical Impressions(s) / ED Diagnoses   Final diagnoses:  Altered mental status, unspecified altered mental status type  Anxiety    New Prescriptions New Prescriptions   No medications on file     Zadie Rhine, MD 02/16/16 1918

## 2016-02-16 NOTE — ED Notes (Signed)
Pt states she feels like she is trapped in her own body. Pt reports having a seizure on 02/01/16.  Pt reports extensive dental and anxiety.

## 2016-02-16 NOTE — ED Triage Notes (Signed)
PT brought in by [employer] d/t pt stated she is having recurrent seizures but no activity noted. PT sitting in triage talking to herself and when asked to whom she said she is repeating her medication list to focus on something to keep from having a seizure. Paramedic concerned of possible dental abscess infection causing the increased altered mental status. PT denies any SI/HI.

## 2016-02-22 ENCOUNTER — Ambulatory Visit: Payer: Medicaid Other | Admitting: Neurology

## 2016-02-26 ENCOUNTER — Emergency Department (HOSPITAL_COMMUNITY)
Admission: EM | Admit: 2016-02-26 | Discharge: 2016-02-26 | Disposition: A | Discharge status: Home / Self Care | Payer: Medicaid Other | Attending: Emergency Medicine | Admitting: Emergency Medicine

## 2016-02-26 ENCOUNTER — Encounter (HOSPITAL_COMMUNITY): Payer: Self-pay

## 2016-02-26 ENCOUNTER — Emergency Department (HOSPITAL_COMMUNITY): Payer: Medicaid Other

## 2016-02-26 DIAGNOSIS — G44209 Tension-type headache, unspecified, not intractable: Secondary | ICD-10-CM

## 2016-02-26 DIAGNOSIS — Z79899 Other long term (current) drug therapy: Secondary | ICD-10-CM | POA: Insufficient documentation

## 2016-02-26 DIAGNOSIS — I10 Essential (primary) hypertension: Secondary | ICD-10-CM | POA: Diagnosis not present

## 2016-02-26 DIAGNOSIS — F17210 Nicotine dependence, cigarettes, uncomplicated: Secondary | ICD-10-CM | POA: Insufficient documentation

## 2016-02-26 LAB — URINALYSIS, ROUTINE W REFLEX MICROSCOPIC
Bilirubin Urine: NEGATIVE
GLUCOSE, UA: NEGATIVE mg/dL
Ketones, ur: NEGATIVE mg/dL
LEUKOCYTES UA: NEGATIVE
Nitrite: NEGATIVE
PH: 6.5 (ref 5.0–8.0)
Protein, ur: 30 mg/dL — AB
SPECIFIC GRAVITY, URINE: 1.02 (ref 1.005–1.030)

## 2016-02-26 LAB — URINE MICROSCOPIC-ADD ON

## 2016-02-26 LAB — I-STAT CHEM 8, ED
BUN: 4 mg/dL — AB (ref 6–20)
CHLORIDE: 102 mmol/L (ref 101–111)
CREATININE: 0.9 mg/dL (ref 0.44–1.00)
Calcium, Ion: 1.17 mmol/L (ref 1.13–1.30)
Glucose, Bld: 103 mg/dL — ABNORMAL HIGH (ref 65–99)
HEMATOCRIT: 44 % (ref 36.0–46.0)
HEMOGLOBIN: 15 g/dL (ref 12.0–15.0)
POTASSIUM: 3.7 mmol/L (ref 3.5–5.1)
Sodium: 142 mmol/L (ref 135–145)
TCO2: 26 mmol/L (ref 0–100)

## 2016-02-26 LAB — POC URINE PREG, ED: Preg Test, Ur: NEGATIVE

## 2016-02-26 MED ORDER — OXYCODONE-ACETAMINOPHEN 5-325 MG PO TABS
1.0000 | ORAL_TABLET | Freq: Once | ORAL | Status: AC
  Administered 2016-02-26: 1 via ORAL
  Filled 2016-02-26: qty 1

## 2016-02-26 NOTE — ED Provider Notes (Signed)
Emergency Department Provider Note   I have reviewed the triage vital signs and the nursing notes.   HISTORY  Chief Complaint Migraine and Anxiety   HPI Melissa Butler is a 40 y.o. female with PMH of anxiety, depression, GERD presents to the emergency department for evaluation of persistent gradually worsening headache on the right side of her head starting earlier today. Patient states that she felt a sudden pressure in her ears and pressure in her head. She denies any numbness or weakness in her arms or legs. No vision changes. No associated fever or shaking chills. His to any medications. She has been compliant with her home anxiety medications. No chest pain or difficulty breathing. She had no difficulty walking. She self reports having a seizure 2 weeks ago and was referred to a neurologist. No additional seizure activity today. She is not currently on antiepileptic medication.  Past Medical History:  Diagnosis Date  . Anxiety   . Chronic abdominal pain   . Chronic pelvic pain in female   . Depression   . GERD (gastroesophageal reflux disease)   . History of cardiac catheterization 11/06/2015   normal coronary arteries  . History of echocardiogram 11/06/2015   normal  . Hypertension   . MI (myocardial infarction) (HCC) 10/2015   elevated troponin, likely due to vasospasm, clean coronary arteries on cath  . Panic attack     Patient Active Problem List   Diagnosis Date Noted  . Insomnia   . Anxiety   . 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 Panic disorder 01/04/2016  . 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 NSTEMI (non-ST elevated myocardial infarction) (HCC) 11/06/2015  . Normal coronary arteries 11/06/15 11/06/2015    Past Surgical History:  Procedure Laterality Date  . CARDIAC CATHETERIZATION N/A 11/06/2015   Procedure: Left Heart Cath and Coronary Angiography;  Surgeon: Thurmon Fair, MD;  Location: [facility] INVAS[facility] CV LAB;   Service: Cardiovascular;  Laterality: N/A;  . CESAREAN SECTION    . NO PAST SURGERIES      Current Outpatient Rx  . Order #: [id number] Class: Historical Med  . Order #: [id number] Class: Print  . Order #: [id number] Class: Historical Med  . Order #: [id number] Class: Historical Med  . Order #: [id number] Class: Historical Med  . Order #: [id number] Class: Historical Med  . Order #: [id number] Class: Print  . Order #: [id number] Class: Historical Med  . Order #: [id number] Class: Print  . Order #: [id number] Class: Historical Med  . Order #: [id number] Class: Print    Allergies Ciprofloxacin; Penicillins; Toradol [ketorolac tromethamine]; Amoxicillin; Codeine; Metronidazole; and Sulfa antibiotics  No family history on file.  Social History Social History  Substance Use Topics  . Smoking status: Current Every Day Smoker    Packs/day: 0.50    Years: 5.00    Types: Cigarettes  . Smokeless tobacco: Never Used  . Alcohol use No    Review of Systems  Constitutional: No fever/chills Eyes: No visual changes. ENT: No sore throat. Cardiovascular: Denies chest pain. Respiratory: Denies shortness of breath. Gastrointestinal: No abdominal pain.  No nausea, no vomiting.  No diarrhea.  No constipation. Genitourinary: Negative for dysuria. Musculoskeletal: Negative for back pain. Skin: Negative for rash. Neurological: Negative for focal weakness or numbness. Positive HA.   10-point ROS otherwise negative.  ____________________________________________   PHYSICAL EXAM:  VITAL SIGNS: ED Triage Vitals  Enc Vitals Group     BP 02/26/16 1833 120/86     Pulse Rate 02/26/16 1833 73  Resp 02/26/16 1833 16     Temp 02/26/16 1833 98.6 F (37 C)     Temp Source 02/26/16 1833 Oral     SpO2 02/26/16 1833 100 %     Pain Score 02/26/16 1829 10   Constitutional: Alert and oriented. Well appearing and in no acute distress. Eyes: Conjunctivae are normal. PERRL. EOMI. Head: Atraumatic. Nose: No  congestion/rhinnorhea. Mouth/Throat: Mucous membranes are moist.  Oropharynx non-erythematous. Neck: No stridor.  No meningeal signs.  Cardiovascular: Normal rate, regular rhythm. Good peripheral circulation. Grossly normal heart sounds.   Respiratory: Normal respiratory effort.  No retractions. Lungs CTAB. Gastrointestinal: Soft and nontender. No distention.  Musculoskeletal: No lower extremity tenderness nor edema. No gross deformities of extremities. Neurologic:  Normal speech and language. No gross focal neurologic deficits are appreciated. Normal finger-to-nose testing. No pronator drift. Normal gait.  Skin:  Skin is warm, dry and intact. No rash noted. Psychiatric: Mood and affect are normal. Speech and behavior are normal.  ____________________________________________   LABS (all labs ordered are listed, but only abnormal results are displayed)  Labs Reviewed  URINALYSIS, ROUTINE W REFLEX MICROSCOPIC (NOT AT [facility]) - Abnormal; Notable for the following:       Result Value   Color, Urine AMBER (*)    APPearance HAZY (*)    Hgb urine dipstick LARGE (*)    Protein, ur 30 (*)    All other components within normal limits  URINE MICROSCOPIC-ADD ON - Abnormal; Notable for the following:    Squamous Epithelial / LPF 0-5 (*)    Bacteria, UA MANY (*)    All other components within normal limits  I-STAT CHEM 8, ED - Abnormal; Notable for the following:    BUN 4 (*)    Glucose, Bld 103 (*)    All other components within normal limits  POC URINE PREG, ED   ____________________________________________  RADIOLOGY  Ct Head Wo Contrast  Result Date: 02/26/2016 CLINICAL DATA:  Patient with headache and right-sided pressure radiating down the right neck. EXAM: CT HEAD WITHOUT CONTRAST TECHNIQUE: Contiguous axial images were obtained from the base of the skull through the vertex without intravenous contrast. COMPARISON:  MRI 01/04/2016 ; brain CT 12/31/2015 FINDINGS: Ventricles and sulci are  appropriate for patient's age. No evidence for acute cortically based infarct, intracranial hemorrhage, mass lesion or mass-effect. Orbits are unremarkable. Paranasal sinuses are well aerated. Mastoid air cells are unremarkable. Calvarium is intact. IMPRESSION: No acute intracranial process. Electronically Signed   By: Annia Belt M.D.   On: 02/26/2016 21:19    ____________________________________________   PROCEDURES  Procedure(s) performed:   Procedures  None ____________________________________________   INITIAL IMPRESSION / ASSESSMENT AND PLAN / ED COURSE  Pertinent labs & imaging results that were available during my care of the patient were reviewed by me and considered in my medical decision making (see chart for details).  Patient presents to the emergency department for evaluation of gradually worsening right sided headache over the course of the last 8 hours. No sudden onset maximal intensity headache to raise suspicion for subarachnoid hemorrhage. Patient is awake and alert with normal neurological exam. She has a history of anxiety and reported being very concerned about symptoms related to recent seizure and this headache. Given no prior history of neuro imaging will obtain a noncontrast CT scan of the head. We'll treat acute pain, follow labs including urinalysis and urine pregnancy.  10:04 PM Patient is feeling much improved after medication. Normal CT head. Patient with  many bacteria on UA but no leukocyte esterase, nitrite, white blood cells. Will send for culture and treat if gross significant bacteria. Patient is feeling somewhat improved. Suspect tension headache. Discussed return precautions in detail. Patient will follow with her primary care physician.   At this time, I do not feel there is any life-threatening condition present. I have reviewed and discussed all results (EKG, imaging, lab, urine as appropriate), exam findings with patient. I have reviewed nursing  notes and appropriate previous records.  I feel the patient is safe to be discharged home without further emergent workup. Discussed usual and customary return precautions. Patient and family (if present) verbalize understanding and are comfortable with this plan.  Patient will follow-up with their primary care provider. If they do not have a primary care provider, information for follow-up has been provided to them. All questions have been answered.  ____________________________________________  FINAL CLINICAL IMPRESSION(S) / ED DIAGNOSES  Final diagnoses:  Tension headache     MEDICATIONS GIVEN DURING THIS VISIT:  Medications  oxyCODONE-acetaminophen (PERCOCET/ROXICET) 5-325 MG per tablet 1 tablet (1 tablet Oral Given 02/26/16 2015)     NEW OUTPATIENT MEDICATIONS STARTED DURING THIS VISIT:  None   Note:  This document was prepared using Dragon voice recognition software and may include unintentional dictation errors.  Alona Bene, MD Emergency Medicine   Maia Plan, MD 02/27/16 [phone]

## 2016-02-26 NOTE — Discharge Instructions (Signed)

## 2016-02-26 NOTE — ED Triage Notes (Signed)
Patient here from home for headache that started last night. Denies dizziness, blurry vision. Reports of hx of anxiety. Per EMS patient has 1mg  Ativan V given en route.

## 2016-03-04 ENCOUNTER — Ambulatory Visit (INDEPENDENT_AMBULATORY_CARE_PROVIDER_SITE_OTHER): Payer: Medicaid Other | Admitting: Cardiovascular Disease

## 2016-03-04 ENCOUNTER — Encounter: Payer: Self-pay | Admitting: Cardiovascular Disease

## 2016-03-04 VITALS — BP 130/80 | HR 92 | Ht 62.0 in | Wt 150.0 lb

## 2016-03-04 DIAGNOSIS — R000 Tachycardia, unspecified: Secondary | ICD-10-CM

## 2016-03-04 DIAGNOSIS — I252 Old myocardial infarction: Secondary | ICD-10-CM | POA: Diagnosis not present

## 2016-03-04 DIAGNOSIS — I10 Essential (primary) hypertension: Secondary | ICD-10-CM | POA: Diagnosis not present

## 2016-03-04 MED ORDER — METOPROLOL TARTRATE 25 MG PO TABS: 37.5000 mg | ORAL_TABLET | Freq: Two times a day (BID) | ORAL | 3 refills | Status: DC

## 2016-03-04 NOTE — Progress Notes (Signed)
SUBJECTIVE: 40 year old woman evaluated by cardiology in April 2017. Has a history of tobacco use and anxiety. Her anxiety is so severe that she is on disability. At that time her troponins were positive. Echocardiogram showed normal left ventricular systolic function without wall motion abnormalities, coronary angiography showed normal coronary arteries. He was thought she possibly had coronary artery spasm. She was encouraged to stop smoking.  ECG in late July showed sinus rhythm with left anterior fascicular block. Computer reading reports "old anteroseptal infarct" but this is not the case.  She is here with an EMT who tells me blood pressure gets to 160/80 in the afternoons. When she becomes anxious she develops a sinus tachycardia. She has provided rhythm strips.   Review of Systems: As per "subjective", otherwise negative.    Current Outpatient Prescriptions  Medication Sig Dispense Refill  . beclomethasone (QVAR) 80 MCG/ACT inhaler Inhale 2 puffs into the lungs 2 (two) times daily.    . benztropine (COGENTIN) 1 MG tablet Take 1 tablet (1 mg total) by mouth daily. 30 tablet 0  . busPIRone (BUSPAR) 10 MG tablet Take 10 mg by mouth 3 (three) times daily.    Marland Kitchen docusate sodium (COLACE) 100 MG capsule Take 100 mg by mouth 2 (two) times daily.    . fluconazole (DIFLUCAN) 150 MG tablet Take 150 mg by mouth once.    . metoprolol tartrate (LOPRESSOR) 25 MG tablet Take 1 tablet (25 mg total) by mouth 2 (two) times daily. 60 tablet 0  . mirtazapine (REMERON) 30 MG tablet Take 30 mg by mouth at bedtime.    . pantoprazole (PROTONIX) 40 MG tablet Take 1 tablet (40 mg total) by mouth daily. 30 tablet 0  . sertraline (ZOLOFT) 100 MG tablet Take 100 mg by mouth daily.     No current facility-administered medications for this visit.     Past Medical History:  Diagnosis Date  . Anxiety   . Chronic abdominal pain   . Chronic pelvic pain in female   . Depression   . GERD (gastroesophageal  reflux disease)   . History of cardiac catheterization 11/06/2015   normal coronary arteries  . History of echocardiogram 11/06/2015   normal  . Hypertension   . MI (myocardial infarction) (HCC) 10/2015   elevated troponin, likely due to vasospasm, clean coronary arteries on cath  . Panic attack     Past Surgical History:  Procedure Laterality Date  . CARDIAC CATHETERIZATION N/A 11/06/2015   Procedure: Left Heart Cath and Coronary Angiography;  Surgeon: Thurmon Fair, MD;  Location: [facility] INVASIVE [facility];  Service: Cardiovascular;  Laterality: N/A;  . CESAREAN SECTION    . NO PAST SURGERIES      Social History   Social History  . Marital status: Single    Spouse name: N/A  . Number of children: N/A  . Years of education: N/A   Occupational History  . Not on file.   Social History Main Topics  . Smoking status: Light Tobacco Smoker    Packs/day: 0.50    Years: 5.00    Types: Cigarettes  . Smokeless tobacco: Never Used  . Alcohol use No  . Drug use: No  . Sexual activity: Yes   Other Topics Concern  . Not on file   Social History Narrative  . No narrative on file     Vitals:   03/04/16 1052  Pulse: 92  SpO2: 97%  Weight: 150 lb (68 kg)  Height: 5'  2" (1.575 m)    PHYSICAL EXAM General: NAD, anxious HEENT: EOMI. Neck: No JVD, no thyromegaly. Lungs: Clear to auscultation bilaterally with normal respiratory effort. CV: Nondisplaced PMI.  Regular rate and rhythm, normal S1/S2, no S3/S4, no murmur. No pretibial or periankle edema.     Abdomen: no distention.  Neurologic: Alert   Psych: Anxious    ECG: Most recent ECG reviewed.      ASSESSMENT AND PLAN: 1. H/o elevated troponin with normal coronaries: Continue metoprolol. ?vasospasm.  2. HTN:  Increase metoprolol to 37.5 mg bid. Driven by anxiety.  3. Sinus tachycardia: Increase metoprolol to 37.5 mg bid. Needs anxiety treatment.  Dispo: fu 1 year.   Prentice Docker, M.D., F.A.C.C.

## 2016-03-04 NOTE — Patient Instructions (Signed)
Your physician wants you to follow-up in:  1 year with Harriet Pho NP You will receive a reminder letter in the mail two months in advance. If you don't receive a letter, please call our office to schedule the follow-up appointment.     INCREASE Lopressor to 37.5 mg twice a day     Thank you for choosing [facility] !

## 2016-03-08 ENCOUNTER — Ambulatory Visit: Payer: Medicaid Other | Admitting: Neurology

## 2016-03-09 ENCOUNTER — Encounter: Payer: Self-pay | Admitting: *Deleted

## 2016-03-10 ENCOUNTER — Encounter: Payer: Medicaid Other | Admitting: Obstetrics and Gynecology

## 2016-03-16 ENCOUNTER — Encounter: Payer: Self-pay | Admitting: Neurology

## 2016-03-22 ENCOUNTER — Ambulatory Visit: Payer: Self-pay | Admitting: Cardiovascular Disease

## 2016-04-15 ENCOUNTER — Telehealth: Payer: Self-pay | Admitting: Cardiology

## 2016-04-15 ENCOUNTER — Other Ambulatory Visit: Payer: Self-pay

## 2016-04-15 MED ORDER — METOPROLOL TARTRATE 25 MG PO TABS: 37.5000 mg | ORAL_TABLET | Freq: Two times a day (BID) | ORAL | 3 refills | Status: DC

## 2016-04-15 NOTE — Telephone Encounter (Signed)
Refill complete 

## 2016-04-15 NOTE — Telephone Encounter (Signed)
Patient needs Metoprolol sent to [facility] / tg

## 2016-04-21 ENCOUNTER — Other Ambulatory Visit: Payer: Self-pay

## 2016-04-21 MED ORDER — PANTOPRAZOLE SODIUM 40 MG PO TBEC: 40.0000 mg | DELAYED_RELEASE_TABLET | Freq: Every day | ORAL | 0 refills | Status: DC

## 2016-04-21 MED ORDER — METOPROLOL TARTRATE 25 MG PO TABS: 37.5000 mg | ORAL_TABLET | Freq: Two times a day (BID) | ORAL | 1 refills | Status: DC

## 2016-04-21 NOTE — Telephone Encounter (Signed)
Refilled protonix and metoprolol to [facility]

## 2016-05-09 ENCOUNTER — Encounter: Payer: Medicaid Other | Admitting: Obstetrics and Gynecology

## 2016-05-09 ENCOUNTER — Encounter: Payer: Self-pay | Admitting: *Deleted

## 2016-05-30 ENCOUNTER — Other Ambulatory Visit: Payer: Self-pay

## 2016-05-30 DIAGNOSIS — G4733 Obstructive sleep apnea (adult) (pediatric): Secondary | ICD-10-CM

## 2016-09-12 ENCOUNTER — Encounter (HOSPITAL_COMMUNITY): Payer: Self-pay | Admitting: *Deleted

## 2016-09-12 ENCOUNTER — Emergency Department (HOSPITAL_COMMUNITY): Payer: Medicaid Other

## 2016-09-12 ENCOUNTER — Emergency Department (HOSPITAL_COMMUNITY)
Admission: EM | Admit: 2016-09-12 | Discharge: 2016-09-14 | Payer: No Typology Code available for payment source | Attending: Emergency Medicine | Admitting: Emergency Medicine

## 2016-09-12 DIAGNOSIS — Z9114 Patient's other noncompliance with medication regimen: Secondary | ICD-10-CM | POA: Insufficient documentation

## 2016-09-12 DIAGNOSIS — R0602 Shortness of breath: Secondary | ICD-10-CM | POA: Insufficient documentation

## 2016-09-12 DIAGNOSIS — F332 Major depressive disorder, recurrent severe without psychotic features: Secondary | ICD-10-CM | POA: Diagnosis present

## 2016-09-12 DIAGNOSIS — R45850 Homicidal ideations: Secondary | ICD-10-CM

## 2016-09-12 DIAGNOSIS — F29 Unspecified psychosis not due to a substance or known physiological condition: Secondary | ICD-10-CM | POA: Insufficient documentation

## 2016-09-12 DIAGNOSIS — I10 Essential (primary) hypertension: Secondary | ICD-10-CM | POA: Insufficient documentation

## 2016-09-12 DIAGNOSIS — I252 Old myocardial infarction: Secondary | ICD-10-CM | POA: Insufficient documentation

## 2016-09-12 DIAGNOSIS — R05 Cough: Secondary | ICD-10-CM | POA: Insufficient documentation

## 2016-09-12 DIAGNOSIS — F3181 Bipolar II disorder: Secondary | ICD-10-CM | POA: Diagnosis present

## 2016-09-12 DIAGNOSIS — Z79899 Other long term (current) drug therapy: Secondary | ICD-10-CM | POA: Insufficient documentation

## 2016-09-12 LAB — URINALYSIS, ROUTINE W REFLEX MICROSCOPIC
Bilirubin Urine: NEGATIVE
Glucose, UA: NEGATIVE mg/dL
Hgb urine dipstick: NEGATIVE
KETONES UR: 80 mg/dL — AB
LEUKOCYTES UA: NEGATIVE
Nitrite: NEGATIVE
PH: 6 (ref 5.0–8.0)
PROTEIN: 100 mg/dL — AB
Specific Gravity, Urine: 1.02 (ref 1.005–1.030)

## 2016-09-12 LAB — COMPREHENSIVE METABOLIC PANEL
ALK PHOS: 77 U/L (ref 38–126)
ALT: 23 U/L (ref 14–54)
ANION GAP: 14 (ref 5–15)
AST: 26 U/L (ref 15–41)
Albumin: 4.5 g/dL (ref 3.5–5.0)
BILIRUBIN TOTAL: 0.7 mg/dL (ref 0.3–1.2)
BUN: 9 mg/dL (ref 6–20)
CALCIUM: 9.2 mg/dL (ref 8.9–10.3)
CO2: 19 mmol/L — ABNORMAL LOW (ref 22–32)
CREATININE: 0.88 mg/dL (ref 0.44–1.00)
Chloride: 99 mmol/L — ABNORMAL LOW (ref 101–111)
GFR calc Af Amer: >60 mL/min (ref >60)
Glucose, Bld: 128 mg/dL — ABNORMAL HIGH (ref 65–99)
Potassium: 3.7 mmol/L (ref 3.5–5.1)
Sodium: 132 mmol/L — ABNORMAL LOW (ref 135–145)
TOTAL PROTEIN: 7.6 g/dL (ref 6.5–8.1)

## 2016-09-12 LAB — CBC
HEMATOCRIT: 50 % — AB (ref 36.0–46.0)
HEMOGLOBIN: 16.1 g/dL — AB (ref 12.0–15.0)
MCH: 28.3 pg (ref 26.0–34.0)
MCHC: 32.2 g/dL (ref 30.0–36.0)
MCV: 88 fL (ref 78.0–100.0)
Platelets: 286 10*3/uL (ref 150–400)
RBC: 5.68 MIL/uL — ABNORMAL HIGH (ref 3.87–5.11)
RDW: 17.4 % — ABNORMAL HIGH (ref 11.5–15.5)
WBC: 13.8 10*3/uL — ABNORMAL HIGH (ref 4.0–10.5)

## 2016-09-12 LAB — RAPID URINE DRUG SCREEN, HOSP PERFORMED
Amphetamines: NOT DETECTED
Barbiturates: NOT DETECTED
Benzodiazepines: POSITIVE — AB
COCAINE: NOT DETECTED
OPIATES: NOT DETECTED
TETRAHYDROCANNABINOL: NOT DETECTED

## 2016-09-12 LAB — ETHANOL: Alcohol, Ethyl (B): <5 mg/dL (ref <5)

## 2016-09-12 LAB — D-DIMER, QUANTITATIVE: D-Dimer, Quant: <0.27 ug/mL-FEU (ref 0.00–0.50)

## 2016-09-12 MED ORDER — METOPROLOL TARTRATE 25 MG PO TABS
37.5000 mg | ORAL_TABLET | Freq: Two times a day (BID) | ORAL | Status: DC
  Administered 2016-09-12 – 2016-09-14 (×4): 37.5 mg via ORAL
  Filled 2016-09-12 (×4): qty 2

## 2016-09-12 MED ORDER — LORAZEPAM 1 MG PO TABS
1.0000 mg | ORAL_TABLET | Freq: Three times a day (TID) | ORAL | Status: DC | PRN
  Administered 2016-09-12 – 2016-09-14 (×4): 1 mg via ORAL
  Filled 2016-09-12 (×5): qty 1

## 2016-09-12 MED ORDER — SODIUM CHLORIDE 0.9 % IV BOLUS (SEPSIS)
1000.0000 mL | Freq: Once | INTRAVENOUS | Status: AC
  Administered 2016-09-12: 1000 mL via INTRAVENOUS

## 2016-09-12 MED ORDER — LORAZEPAM 2 MG/ML IJ SOLN
1.0000 mg | Freq: Once | INTRAMUSCULAR | Status: AC
  Administered 2016-09-13: 1 mg via INTRAVENOUS
  Filled 2016-09-12 (×2): qty 1

## 2016-09-12 MED ORDER — ZIPRASIDONE HCL 20 MG PO CAPS
ORAL_CAPSULE | ORAL | Status: AC
  Filled 2016-09-12: qty 2

## 2016-09-12 MED ORDER — ZIPRASIDONE HCL 20 MG PO CAPS
20.0000 mg | ORAL_CAPSULE | Freq: Two times a day (BID) | ORAL | Status: DC
  Administered 2016-09-12 – 2016-09-14 (×5): 20 mg via ORAL
  Filled 2016-09-12 (×8): qty 1

## 2016-09-12 MED ORDER — MIRTAZAPINE 15 MG PO TABS
30.0000 mg | ORAL_TABLET | Freq: Every day | ORAL | Status: DC
  Administered 2016-09-12 – 2016-09-13 (×2): 30 mg via ORAL
  Filled 2016-09-12 (×2): qty 1
  Filled 2016-09-12 (×2): qty 2

## 2016-09-12 MED ORDER — LORAZEPAM 2 MG/ML IJ SOLN
0.5000 mg | Freq: Once | INTRAMUSCULAR | Status: AC
  Administered 2016-09-12: 0.5 mg via INTRAVENOUS
  Filled 2016-09-12: qty 1

## 2016-09-12 MED ORDER — MIRTAZAPINE 30 MG PO TABS
ORAL_TABLET | ORAL | Status: AC
  Filled 2016-09-12: qty 1

## 2016-09-12 MED ORDER — SODIUM CHLORIDE 0.9 % IV BOLUS (SEPSIS): 1000.0000 mL | Freq: Once | INTRAVENOUS | Status: DC

## 2016-09-12 NOTE — ED Notes (Signed)
Pt verbalized to NT "I want my hands and feet handcuffed to the bed because I feel like I am going to attack someone". MD and security notified.

## 2016-09-12 NOTE — ED Provider Notes (Signed)
[facility] DEPT Provider Note   CSN: [id number] Arrival date & time: 09/12/16  1316   By signing my name below, I, Freida Busman, attest that this documentation has been prepared under the direction and in the presence of Margarita Grizzle, MD . Electronically Signed: Freida Busman, Scribe. 09/12/2016. 1:42 PM.   History   Chief Complaint Chief Complaint  Patient presents with  . V70.1    The history is provided by the patient. No language interpreter was used.     HPI Comments:  Melissa Butler is a 41 y.o. female who presents to the Emergency Department via EMS complaining of HI. Her only plan at this time is to hit someone. Pt states she is "losing it", reports associated auditory hallucinations telling her to hurt someone as well as  SI. Pt has a h/o anxiety, panic attacks, and depression and has not t been compliant with her meds. She states she ran out a long time ago and does not have a psychiatrist to write her for more. She lives alone and was at home alone when she decided to call EMS. Pt denies pain at this time but notes mild SOB and cough. She denies tobacco use. No alleviating factors noted.   No PCP    Past Medical History:  Diagnosis Date  . Anxiety   . Chronic abdominal pain   . Chronic pelvic pain in female   . Depression   . GERD (gastroesophageal reflux disease)   . History of cardiac catheterization 11/06/2015   normal coronary arteries  . History of echocardiogram 11/06/2015   normal  . Hypertension   . MI (myocardial infarction) 10/2015   elevated troponin, likely due to vasospasm, clean coronary arteries on cath  . Panic attack     Patient Active Problem List   Diagnosis Date Noted  . Insomnia   . Anxiety   . 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 Panic disorder 01/04/2016  . 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 NSTEMI (non-ST elevated myocardial infarction) (HCC) 11/06/2015  . Normal coronary  arteries 11/06/15 11/06/2015    Past Surgical History:  Procedure Laterality Date  . CARDIAC CATHETERIZATION N/A 11/06/2015   Procedure: Left Heart Cath and Coronary Angiography;  Surgeon: Thurmon Fair, MD;  Location: [facility];  Service: Cardiovascular;  Laterality: N/A;  . CESAREAN SECTION    . NO PAST SURGERIES      OB History    Gravida Para Term Preterm AB Living             2   SAB TAB Ectopic Multiple Live Births                   Home Medications    Prior to Admission medications   Medication Sig Start Date End Date Taking? Authorizing Provider  beclomethasone (QVAR) 80 MCG/ACT inhaler Inhale 2 puffs into the lungs 2 (two) times daily.    Historical Provider, MD  benztropine (COGENTIN) 1 MG tablet Take 1 tablet (1 mg total) by mouth daily. 02/14/16   Margarita Grizzle, MD  busPIRone (BUSPAR) 10 MG tablet Take 10 mg by mouth 3 (three) times daily.    Historical Provider, MD  docusate sodium (COLACE) 100 MG capsule Take 100 mg by mouth 2 (two) times daily.    Historical Provider, MD  fluconazole (DIFLUCAN) 150 MG tablet Take 150 mg by mouth once.    Historical Provider, MD  metoprolol tartrate (LOPRESSOR) 25 MG tablet  Take 1.5 tablets (37.5 mg total) by mouth 2 (two) times daily. 04/21/16   Laqueta Linden, MD  mirtazapine (REMERON) 30 MG tablet Take 30 mg by mouth at bedtime.    Historical Provider, MD  pantoprazole (PROTONIX) 40 MG tablet Take 1 tablet (40 mg total) by mouth daily. 04/21/16   Laqueta Linden, MD  sertraline (ZOLOFT) 100 MG tablet Take 100 mg by mouth daily.    Historical Provider, MD    Family History No family history on file.  Social History Social History  Substance Use Topics  . Smoking status: Light Tobacco Smoker    Packs/day: 0.50    Years: 5.00    Types: Cigarettes  . Smokeless tobacco: Never Used  . Alcohol use No     Allergies   Ciprofloxacin; Penicillins; Toradol [ketorolac tromethamine]; Amoxicillin; Codeine;  Metronidazole; and Sulfa antibiotics   Review of Systems Review of Systems  Respiratory: Positive for cough and shortness of breath.   Cardiovascular: Negative for chest pain.  Genitourinary: Negative for dysuria.  Musculoskeletal: Negative for back pain and neck pain.  Neurological: Negative for headaches.  Psychiatric/Behavioral: Positive for behavioral problems (HI), hallucinations and suicidal ideas.  All other systems reviewed and are negative.    Physical Exam Updated Vital Signs BP (!) 158/122 (BP Location: Right Arm)   Pulse (!) 142   Temp 98.6 F (37 C) (Oral)   Resp 18   Wt 150 lb (68 kg)   SpO2 99%   BMI 27.44 kg/m   Physical Exam  Constitutional: She is oriented to person, place, and time. She appears well-developed and well-nourished. No distress.  HENT:  Head: Normocephalic and atraumatic.  Eyes: Conjunctivae are normal.  Cardiovascular: Tachycardia present.   138 bpm  Pulmonary/Chest: Effort normal and breath sounds normal.  Abdominal: Soft. Bowel sounds are normal. She exhibits no distension.  Musculoskeletal: Normal range of motion. She exhibits no edema or tenderness.  Neurological: She is alert and oriented to person, place, and time.  Skin: Skin is warm and dry. Capillary refill takes less than 2 seconds.  Psychiatric: Her speech is normal. Her mood appears anxious. Her affect is labile. She is agitated. Cognition and memory are normal. She expresses homicidal ideation. She expresses homicidal plans.  Nursing note and vitals reviewed.    ED Treatments / Results  DIAGNOSTIC STUDIES:  Oxygen Saturation is 100% on RA, normal by my interpretation.    COORDINATION OF CARE:  1:30 PM Discussed treatment plan with pt at bedside and pt agreed to plan.  Labs (all labs ordered are listed, but only abnormal results are displayed) Labs Reviewed  RAPID URINE DRUG SCREEN, HOSP PERFORMED  CBC  COMPREHENSIVE METABOLIC PANEL  ETHANOL  I-STAT BETA HCG  BLOOD, ED ([facility], [facility], [facility] ONLY)    EKG  EKG Interpretation  Date/Time:  Monday September 12 2016 15:03:27 EST Ventricular Rate:  128 PR Interval:    QRS Duration: 83 QT Interval:  325 QTC Calculation: 475 R Axis:   -80 Text Interpretation:  Sinus tachycardia Left anterior fascicular block Abnormal R-wave progression, late transition Confirmed by Dashanique Brownstein MD, Duwayne Heck [phone]) on 09/12/2016 3:07:38 PM       Radiology Dg Chest Port 1 View  Result Date: 09/12/2016 CLINICAL DATA:  Shortness of breath, not resolving. EXAM: PORTABLE CHEST 1 VIEW COMPARISON:  01/21/2016 FINDINGS: The heart size and mediastinal contours are within normal limits. Both lungs are clear. The visualized skeletal structures are unremarkable. IMPRESSION: No active disease. Electronically Signed  By: Paulina Fusi M.D.   On: 09/12/2016 14:01    Procedures Procedures (including critical care time)  Medications Ordered in ED Medications  sodium chloride 0.9 % bolus 1,000 mL (not administered)  LORazepam (ATIVAN) injection 0.5 mg (not administered)     Initial Impression / Assessment and Plan / ED Course  I have reviewed the triage vital signs and the nursing notes.  Pertinent labs & imaging results that were available during my care of the patient were reviewed by me and considered in my medical decision making (see chart for details).    Patient has not been taking her medications. She is initially tachycardic here. She has had resolution of her tachycardia with normal saline and Ativan. She is hypertensive with a blood pressure 150/100, but has not been taking her antihypertensives either. She is given her usual by mouth dose of Lopressor. She reports homicidal ideation and hearing voices. She is awaiting behavioral health evaluation.  Initial TTS evaluation advises that she will be evaluated in the morning by psychiatry  Final Clinical Impressions(s) / ED Diagnoses   Final diagnoses:  Noncompliance with medications   Psychosis, unspecified psychosis type  Homicidal ideation    New Prescriptions New Prescriptions   No medications on file   I personally performed the services described in this documentation, which was scribed in my presence. The recorded information has been reviewed and considered.    Margarita Grizzle, MD 09/12/16 [phone]

## 2016-09-12 NOTE — ED Notes (Signed)
Pt states that "her anxiety is getting increasingly worse".

## 2016-09-12 NOTE — ED Notes (Signed)
Per Rollen Sox, [facility].  Pt will be re-evaluated in the morning.

## 2016-09-12 NOTE — ED Notes (Signed)
Pt. Doesn't want any vistors
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
## 2016-09-12 NOTE — ED Triage Notes (Signed)
Pt reports ideations of harming self and others.  Pt has not had psych meds "for a while".  Pt reports anxiety, emotional distress, and insomnia. Pt denies ETOH or street drug use.

## 2016-09-13 DIAGNOSIS — F333 Major depressive disorder, recurrent, severe with psychotic symptoms: Secondary | ICD-10-CM

## 2016-09-13 DIAGNOSIS — Z882 Allergy status to sulfonamides status: Secondary | ICD-10-CM

## 2016-09-13 DIAGNOSIS — Z880 Allergy status to penicillin: Secondary | ICD-10-CM

## 2016-09-13 DIAGNOSIS — G4700 Insomnia, unspecified: Secondary | ICD-10-CM | POA: Diagnosis not present

## 2016-09-13 DIAGNOSIS — F3181 Bipolar II disorder: Secondary | ICD-10-CM | POA: Diagnosis present

## 2016-09-13 DIAGNOSIS — R419 Unspecified symptoms and signs involving cognitive functions and awareness: Secondary | ICD-10-CM

## 2016-09-13 DIAGNOSIS — Z87891 Personal history of nicotine dependence: Secondary | ICD-10-CM | POA: Diagnosis not present

## 2016-09-13 DIAGNOSIS — R45851 Suicidal ideations: Secondary | ICD-10-CM

## 2016-09-13 DIAGNOSIS — Z79899 Other long term (current) drug therapy: Secondary | ICD-10-CM

## 2016-09-13 DIAGNOSIS — Z888 Allergy status to other drugs, medicaments and biological substances status: Secondary | ICD-10-CM

## 2016-09-13 LAB — PREGNANCY, URINE: Preg Test, Ur: NEGATIVE

## 2016-09-13 MED ORDER — LORAZEPAM 2 MG/ML IJ SOLN: 1.0000 mg | Freq: Once | INTRAMUSCULAR | Status: DC

## 2016-09-13 MED ORDER — ONDANSETRON 4 MG PO TBDP
ORAL_TABLET | ORAL | Status: AC
  Filled 2016-09-13: qty 1

## 2016-09-13 MED ORDER — LORAZEPAM 1 MG PO TABS
1.0000 mg | ORAL_TABLET | Freq: Once | ORAL | Status: AC
  Administered 2016-09-13: 1 mg via ORAL
  Filled 2016-09-13: qty 1

## 2016-09-13 MED ORDER — ONDANSETRON 4 MG PO TBDP
4.0000 mg | ORAL_TABLET | Freq: Once | ORAL | Status: AC
  Administered 2016-09-13: 4 mg via ORAL

## 2016-09-13 MED ORDER — NICOTINE 21 MG/24HR TD PT24
21.0000 mg | MEDICATED_PATCH | Freq: Once | TRANSDERMAL | Status: AC
  Administered 2016-09-13: 21 mg via TRANSDERMAL
  Filled 2016-09-13: qty 1

## 2016-09-13 MED ORDER — ACETAMINOPHEN 325 MG PO TABS
650.0000 mg | ORAL_TABLET | Freq: Once | ORAL | Status: AC
  Administered 2016-09-13: 650 mg via ORAL
  Filled 2016-09-13: qty 2

## 2016-09-13 NOTE — Consult Note (Signed)
Telepsych Consultation   Reason for Consult:  "Depression, anxiety, and medicine not working" Referring Physician:  EDP Patient Identification: Melissa Butler MRN:  [id number] Principal Diagnosis: MDD (major depressive disorder), recurrent severe, without psychosis ([place]) Diagnosis:   Patient Active Problem List   Diagnosis Date Noted  . MDD (major depressive disorder), recurrent severe, without psychosis ([place]) [F33.2] 11/15/2015    Priority: High  . Insomnia [G47.00]   . Anxiety [F41.9]   . 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 Panic disorder [F41.0] 01/04/2016  . NSTEMI (non-ST elevated myocardial infarction) ([place]) [I21.4] 11/06/2015  . Normal coronary arteries 11/06/15 [Z03.89] 11/06/2015    Total Time spent with patient: 30 minutes  Subjective:   Melissa Butler is a 41 y.o. female patient admitted to [facility] related IVC by family reporting of worsening depression, anxiety, and stating that her medication is not working. Pt seen and chart reviewed. Pt is alert/oriented x3, agitated, anxious, and asking if she can come inpatient at the beginning of the interview. Pt reports that she feels suicidal and wants to cut herself in addition to feeling angry and homicidal toward others due to auditory hallucinations telling her to harm herself and others. She reports that she has been off her meds since July and that she had a severe allergic reaction to Saphris at that time, causing her to fear some psychiatric medications.   *Collateral from pt's mother: Wilburn Cornelia [phone]), states that the pt has made numerous self-harm statements, is erratic and unstable, and is a high risk to herself when she is off medication. Pt's mother also confirms that pt had a severe reaction to Saphris and has been unstable and declining for quite some time. Pt's mother is concerned about the pt's safety and reports that this is the worst she has seen her in a long time.   HPI: I have reviewed and concur with HPI  elements below, modified as follows:  "Melissa Butler is an 41 y.o. female was brought to [place] by ambulance after feeling homicidal and suicidal earlier on 09/12/16. Pt reported she has been out of medications for months due to being dropped from [facility]. Pt reported current SI without a plan and HI without an intended victim. Pt reported access to knives and guns at her house. Pt reported depression symptoms including insomnia, irritability, depressed mood, isolating, decreased sleep. Pt reported anxiety with panic attacks that happen every day. Pt reported decreased appetite with loss of 30 to 40 lbs in three months. Pt was unable to contract for safety. "  Pt spent the night in the ED without incident. Seen today on 09/13/2016 as above. Pt has been cooperative with ED staff.    Past Psychiatric History: Major Depression and Psychosis.  Outpatient services with [facility]  Risk to Self: Suicidal Ideation: Yes-Currently Present Suicidal Intent: Yes-Currently Present Is patient at risk for suicide?: Yes Suicidal Plan?: No-Not Currently/Within Last 6 Months Access to Means: Yes Specify Access to Suicidal Means: knife What has been your use of drugs/alcohol within the last 12 months?: none How many times?: 1 Other Self Harm Risks: none Triggers for Past Attempts: Unpredictable, Family contact Intentional Self Injurious Behavior: None Risk to Others: Homicidal Ideation: Yes-Currently Present Thoughts of Harm to Others: No Current Homicidal Intent: No Current Homicidal Plan: No Access to Homicidal Means: Yes (guns and knives) Identified Victim: none, anyone History of harm to others?: No Assessment of Violence: None Noted Violent Behavior Description: none Does patient have access to weapons?: Yes (Comment) (knives and  guns (locked up)) Criminal Charges Pending?: No Does patient have a court date: No Prior Inpatient Therapy: Prior Inpatient Therapy: Yes Prior Therapy Dates: unknown Prior  Therapy Facilty/Provider(s): [facility], [facility] Reason for Treatment: depression Prior Outpatient Therapy: Prior Outpatient Therapy: Yes Prior Therapy Dates: long time ago Prior Therapy Facilty/Provider(s): unknown Reason for Treatment: depression Does patient have an ACCT team?: No Does patient have Intensive In-House Services?  : No Does patient have [facility] services? : No Does patient have P4CC services?: No  Past Medical History:  Past Medical History:  Diagnosis Date  . Anxiety   . Chronic abdominal pain   . Chronic pelvic pain in female   . Depression   . GERD (gastroesophageal reflux disease)   . History of cardiac catheterization 11/06/2015   normal coronary arteries  . History of echocardiogram 11/06/2015   normal  . Hypertension   . MI (myocardial infarction) 10/2015   elevated troponin, likely due to vasospasm, clean coronary arteries on cath  . Panic attack     Past Surgical History:  Procedure Laterality Date  . CARDIAC CATHETERIZATION N/A 11/06/2015   Procedure: Left Heart Cath and Coronary Angiography;  Surgeon: Sanda Klein, MD;  Location: [facility];  Service: Cardiovascular;  Laterality: N/A;  . CESAREAN SECTION    . NO PAST SURGERIES     Family History: History reviewed. No pertinent family history. Family Psychiatric  History: Denies Social History:  History  Alcohol Use No     History  Drug Use No    Social History   Social History  . Marital status: Single    Spouse name: N/A  . Number of children: N/A  . Years of education: N/A   Social History Main Topics  . Smoking status: Former Smoker    Packs/day: 0.50    Years: 5.00    Types: Cigarettes  . Smokeless tobacco: Never Used  . Alcohol use No  . Drug use: No  . Sexual activity: Yes   Other Topics Concern  . None   Social History Narrative  . None   Additional Social History:    Allergies:   Allergies  Allergen Reactions  . Ciprofloxacin Anaphylaxis, Hives and Rash  .  Penicillins Anaphylaxis, Hives, Shortness Of Breath, Swelling and Rash    Has patient had a PCN reaction causing immediate rash, facial/tongue/throat swelling, SOB or lightheadedness with hypotension: Yes Has patient had a PCN reaction causing severe rash involving mucus membranes or skin necrosis: No Has patient had a PCN reaction that required hospitalization No Has patient had a PCN reaction occurring within the last 10 years: No If all of the above answers are "NO", then may proceed with Cephalosporin use.   . Toradol [Ketorolac Tromethamine] Anaphylaxis  . Amoxicillin   . Codeine Nausea And Vomiting  . Metronidazole     Other reaction(s): Other - See Comments "All jittery and burning"  . Sulfa Antibiotics     Labs:  Results for orders placed or performed during the hospital encounter of 09/12/16 (from the past 48 hour(s))  CBC     Status: Abnormal   Collection Time: 09/12/16  1:43 PM  Result Value Ref Range   WBC 13.8 (H) 4.0 - 10.5 K/uL   RBC 5.68 (H) 3.87 - 5.11 MIL/uL   Hemoglobin 16.1 (H) 12.0 - 15.0 g/dL   HCT 50.0 (H) 36.0 - 46.0 %   MCV 88.0 78.0 - 100.0 fL   MCH 28.3 26.0 - 34.0 pg  MCHC 32.2 30.0 - 36.0 g/dL   RDW 17.4 (H) 11.5 - 15.5 %   Platelets 286 150 - 400 K/uL  Comprehensive metabolic panel     Status: Abnormal   Collection Time: 09/12/16  1:43 PM  Result Value Ref Range   Sodium 132 (L) 135 - 145 mmol/L   Potassium 3.7 3.5 - 5.1 mmol/L   Chloride 99 (L) 101 - 111 mmol/L   CO2 19 (L) 22 - 32 mmol/L   Glucose, Bld 128 (H) 65 - 99 mg/dL   BUN 9 6 - 20 mg/dL   Creatinine, Ser 0.88 0.44 - 1.00 mg/dL   Calcium 9.2 8.9 - 10.3 mg/dL   Total Protein 7.6 6.5 - 8.1 g/dL   Albumin 4.5 3.5 - 5.0 g/dL   AST 26 15 - 41 U/L   ALT 23 14 - 54 U/L   Alkaline Phosphatase 77 38 - 126 U/L   Total Bilirubin 0.7 0.3 - 1.2 mg/dL   GFR calc non Af Amer >60 >60 mL/min   GFR calc Af Amer >60 >60 mL/min    Comment: (NOTE) The eGFR has been calculated using the CKD EPI  equation. This calculation has not been validated in all clinical situations. eGFR's persistently <60 mL/min signify possible Chronic Kidney Disease.    Anion gap 14 5 - 15  Ethanol     Status: None   Collection Time: 09/12/16  1:43 PM  Result Value Ref Range   Alcohol, Ethyl (B) <5 <5 mg/dL    Comment:        LOWEST DETECTABLE LIMIT FOR SERUM ALCOHOL IS 5 mg/dL FOR MEDICAL PURPOSES ONLY   Pregnancy, urine     Status: None   Collection Time: 09/12/16  1:50 PM  Result Value Ref Range   Preg Test, Ur NEGATIVE NEGATIVE  Rapid urine drug screen (hospital performed)     Status: Abnormal   Collection Time: 09/12/16  2:09 PM  Result Value Ref Range   Opiates NONE DETECTED NONE DETECTED   Cocaine NONE DETECTED NONE DETECTED   Benzodiazepines POSITIVE (A) NONE DETECTED   Amphetamines NONE DETECTED NONE DETECTED   Tetrahydrocannabinol NONE DETECTED NONE DETECTED   Barbiturates NONE DETECTED NONE DETECTED    Comment:        DRUG SCREEN FOR MEDICAL PURPOSES ONLY.  IF CONFIRMATION IS NEEDED FOR ANY PURPOSE, NOTIFY LAB WITHIN 5 DAYS.        LOWEST DETECTABLE LIMITS FOR URINE DRUG SCREEN Drug Class       Cutoff (ng/mL) Amphetamine      1000 Barbiturate      200 Benzodiazepine   161 Tricyclics       096 Opiates          300 Cocaine          300 THC              50   Urinalysis, Routine w reflex microscopic     Status: Abnormal   Collection Time: 09/12/16  2:09 PM  Result Value Ref Range   Color, Urine YELLOW YELLOW   APPearance CLOUDY (A) CLEAR   Specific Gravity, Urine 1.020 1.005 - 1.030   pH 6.0 5.0 - 8.0   Glucose, UA NEGATIVE NEGATIVE mg/dL   Hgb urine dipstick NEGATIVE NEGATIVE   Bilirubin Urine NEGATIVE NEGATIVE   Ketones, ur 80 (A) NEGATIVE mg/dL   Protein, ur 100 (A) NEGATIVE mg/dL   Nitrite NEGATIVE NEGATIVE   Leukocytes, UA NEGATIVE NEGATIVE  RBC / HPF 0-5 0 - 5 RBC/hpf   WBC, UA 0-5 0 - 5 WBC/hpf   Bacteria, UA RARE (A) NONE SEEN   Hyaline Casts, UA PRESENT    D-dimer, quantitative (not at [facility])     Status: None   Collection Time: 09/12/16  3:13 PM  Result Value Ref Range   D-Dimer, Quant <0.27 0.00 - 0.50 ug/mL-FEU    Comment: (NOTE) At the manufacturer cut-off of 0.50 ug/mL FEU, this assay has been documented to exclude PE with a sensitivity and negative predictive value of 97 to 99%.  At this time, this assay has not been approved by the FDA to exclude DVT/VTE. Results should be correlated with clinical presentation.     Current Facility-Administered Medications  Medication Dose Route Frequency Provider Last Rate Last Dose  . LORazepam (ATIVAN) injection 1 mg  1 mg Intravenous Once Ripley Fraise, MD      . LORazepam (ATIVAN) tablet 1 mg  1 mg Oral Q8H PRN Pattricia Boss, MD   1 mg at 09/13/16 0958  . metoprolol tartrate (LOPRESSOR) tablet 37.5 mg  37.5 mg Oral BID Pattricia Boss, MD   37.5 mg at 09/13/16 0958  . mirtazapine (REMERON) tablet 30 mg  30 mg Oral QHS Pattricia Boss, MD   30 mg at 09/12/16 2216  . sodium chloride 0.9 % bolus 1,000 mL  1,000 mL Intravenous Once Pattricia Boss, MD      . ziprasidone (GEODON) capsule 20 mg  20 mg Oral BID WC Pattricia Boss, MD   20 mg at 09/13/16 2505   Current Outpatient Prescriptions  Medication Sig Dispense Refill  . beclomethasone (QVAR) 80 MCG/ACT inhaler Inhale 2 puffs into the lungs 2 (two) times daily.    . benztropine (COGENTIN) 1 MG tablet Take 1 tablet (1 mg total) by mouth daily. (Patient not taking: Reported on 09/12/2016) 30 tablet 0  . busPIRone (BUSPAR) 10 MG tablet Take 10 mg by mouth 3 (three) times daily.    . pantoprazole (PROTONIX) 40 MG tablet Take 1 tablet (40 mg total) by mouth daily. (Patient not taking: Reported on 09/12/2016) 30 tablet 0  . sertraline (ZOLOFT) 100 MG tablet Take 100 mg by mouth daily.      Musculoskeletal: UTO, camera  Psychiatric Specialty Exam: Physical Exam  Constitutional: She is oriented to person, place, and time.  Patient appears to be less restless  today.  Did not notice excessive movement of arms and hands during this interview  Neck: Normal range of motion.  Respiratory: Effort normal.  Musculoskeletal: Normal range of motion.  Neurological: She is alert and oriented to person, place, and time.  Psychiatric: Her speech is normal and behavior is normal. Her mood appears anxious. Thought content is not paranoid and not delusional. Cognition and memory are normal. She expresses impulsivity. She exhibits a depressed mood. She expresses no homicidal and no suicidal ideation.    Review of Systems  Constitutional:       Continues to complain of restlessness  Psychiatric/Behavioral: Positive for suicidal ideas (Denies). Hallucinations: Denies. Memory loss: States that she is having some problems with her memeory since the seizures. Substance abuse: Denies. The patient is nervous/anxious and has insomnia.     Blood pressure 126/76, pulse 92, temperature 98.6 F (37 C), temperature source Oral, resp. rate 20, height 5' 8" (1.727 m), weight 68 kg (150 lb), SpO2 98 %.Body mass index is 22.81 kg/m.  General Appearance: Casual and Fairly Groomed  Eye Contact:  Good  Speech:  Clear and Coherent and Normal Rate  Volume:  Normal  Mood:  Anxious  Affect:  Congruent  Thought Process:  Coherent and Goal Directed  Orientation:  Full (Time, Place, and Person)  Thought Content:  Logical and Hallucinations: Auditory with commands to harm self  Suicidal Thoughts:  Yes with plans to cut self  Homicidal Thoughts:  No  Memory:  Immediate;   Good Recent;   Good Remote;   Good  Judgement:  Fair  Insight:  Fair  Psychomotor Activity:  Increased  Concentration:  Concentration: Fair and Attention Span: Fair  Recall:  Good  Fund of Knowledge:  Fair  Language:  Good  Akathisia:  Yes  Handed:  Right  AIMS (if indicated):     Assets:  Communication Skills Desire for Improvement Housing Social Support  ADL's:  Intact  Cognition:  WNL  Sleep:         Treatment Plan Summary: MDD (major depressive disorder), recurrent, severe, with psychosis ([place]) unstable, warrants inpatient admission  Disposition: Recommend psychiatric Inpatient admission when medically cleared.  Benjamine Mola, FNP 09/13/2016 11:17 AM

## 2016-09-13 NOTE — Progress Notes (Signed)
09/13/16  CSW faxed referrals to: [facility],  [facility],  [facility],  [facility], [facility],  [facility],  [facility],  [place],  [facility] T. Kaylyn Lim, MSW, LCSWA Clinical Social Work Disposition [phone]

## 2016-09-14 ENCOUNTER — Encounter: Payer: Self-pay | Admitting: Psychiatry

## 2016-09-14 ENCOUNTER — Inpatient Hospital Stay
Admission: EM | Admit: 2016-09-14 | Discharge: 2016-09-19 | DRG: 885 | Disposition: A | Discharge status: Home / Self Care | Payer: No Typology Code available for payment source | Source: Intra-hospital | Attending: Psychiatry | Admitting: Psychiatry

## 2016-09-14 DIAGNOSIS — Z79899 Other long term (current) drug therapy: Secondary | ICD-10-CM

## 2016-09-14 DIAGNOSIS — F419 Anxiety disorder, unspecified: Secondary | ICD-10-CM | POA: Diagnosis present

## 2016-09-14 DIAGNOSIS — I10 Essential (primary) hypertension: Secondary | ICD-10-CM | POA: Diagnosis present

## 2016-09-14 DIAGNOSIS — Z765 Malingerer [conscious simulation]: Secondary | ICD-10-CM | POA: Diagnosis not present

## 2016-09-14 DIAGNOSIS — F1310 Sedative, hypnotic or anxiolytic abuse, uncomplicated: Secondary | ICD-10-CM | POA: Diagnosis present

## 2016-09-14 DIAGNOSIS — Z87891 Personal history of nicotine dependence: Secondary | ICD-10-CM

## 2016-09-14 DIAGNOSIS — F13239 Sedative, hypnotic or anxiolytic dependence with withdrawal, unspecified: Secondary | ICD-10-CM | POA: Diagnosis present

## 2016-09-14 DIAGNOSIS — F13939 Sedative, hypnotic or anxiolytic use, unspecified with withdrawal, unspecified: Secondary | ICD-10-CM | POA: Diagnosis present

## 2016-09-14 DIAGNOSIS — Z7951 Long term (current) use of inhaled steroids: Secondary | ICD-10-CM | POA: Diagnosis not present

## 2016-09-14 DIAGNOSIS — F1320 Sedative, hypnotic or anxiolytic dependence, uncomplicated: Secondary | ICD-10-CM | POA: Diagnosis present

## 2016-09-14 DIAGNOSIS — F4310 Post-traumatic stress disorder, unspecified: Secondary | ICD-10-CM | POA: Diagnosis present

## 2016-09-14 DIAGNOSIS — I252 Old myocardial infarction: Secondary | ICD-10-CM

## 2016-09-14 DIAGNOSIS — F333 Major depressive disorder, recurrent, severe with psychotic symptoms: Secondary | ICD-10-CM | POA: Diagnosis not present

## 2016-09-14 DIAGNOSIS — F3181 Bipolar II disorder: Secondary | ICD-10-CM | POA: Diagnosis present

## 2016-09-14 DIAGNOSIS — R45851 Suicidal ideations: Secondary | ICD-10-CM

## 2016-09-14 DIAGNOSIS — G4700 Insomnia, unspecified: Secondary | ICD-10-CM | POA: Diagnosis present

## 2016-09-14 DIAGNOSIS — J45909 Unspecified asthma, uncomplicated: Secondary | ICD-10-CM | POA: Diagnosis present

## 2016-09-14 DIAGNOSIS — Z9119 Patient's noncompliance with other medical treatment and regimen: Secondary | ICD-10-CM

## 2016-09-14 DIAGNOSIS — K219 Gastro-esophageal reflux disease without esophagitis: Secondary | ICD-10-CM | POA: Diagnosis present

## 2016-09-14 DIAGNOSIS — F17200 Nicotine dependence, unspecified, uncomplicated: Secondary | ICD-10-CM | POA: Diagnosis present

## 2016-09-14 MED ORDER — ALBUTEROL SULFATE (2.5 MG/3ML) 0.083% IN NEBU
3.0000 mL | INHALATION_SOLUTION | Freq: Four times a day (QID) | RESPIRATORY_TRACT | Status: DC | PRN
  Filled 2016-09-14: qty 3

## 2016-09-14 MED ORDER — PANTOPRAZOLE SODIUM 40 MG PO TBEC
40.0000 mg | DELAYED_RELEASE_TABLET | Freq: Every day | ORAL | Status: DC
Start: 2016-09-14 — End: 2016-09-19
  Administered 2016-09-15 – 2016-09-19 (×5): 40 mg via ORAL
  Filled 2016-09-14 (×5): qty 1

## 2016-09-14 MED ORDER — QUETIAPINE FUMARATE 200 MG PO TABS
200.0000 mg | ORAL_TABLET | Freq: Every day | ORAL | Status: DC
  Administered 2016-09-14: 200 mg via ORAL
  Filled 2016-09-14: qty 1

## 2016-09-14 MED ORDER — MIRTAZAPINE 15 MG PO TABS
15.0000 mg | ORAL_TABLET | Freq: Every day | ORAL | Status: DC
  Administered 2016-09-14: 15 mg via ORAL
  Filled 2016-09-14: qty 1

## 2016-09-14 MED ORDER — ACETAMINOPHEN 325 MG PO TABS
650.0000 mg | ORAL_TABLET | Freq: Four times a day (QID) | ORAL | Status: DC | PRN
  Administered 2016-09-16 – 2016-09-19 (×6): 650 mg via ORAL
  Filled 2016-09-14 (×6): qty 2

## 2016-09-14 MED ORDER — TRAZODONE HCL 100 MG PO TABS: 100.0000 mg | ORAL_TABLET | Freq: Every evening | ORAL | Status: DC | PRN

## 2016-09-14 MED ORDER — ONDANSETRON HCL 4 MG PO TABS
4.0000 mg | ORAL_TABLET | Freq: Three times a day (TID) | ORAL | Status: DC | PRN
  Administered 2016-09-14: 4 mg via ORAL
  Filled 2016-09-14 (×2): qty 1

## 2016-09-14 MED ORDER — MAGNESIUM HYDROXIDE 400 MG/5ML PO SUSP: 30.0000 mL | Freq: Every day | ORAL | Status: DC | PRN

## 2016-09-14 MED ORDER — HYDROXYZINE HCL 50 MG PO TABS
50.0000 mg | ORAL_TABLET | Freq: Three times a day (TID) | ORAL | Status: DC | PRN
  Administered 2016-09-14 – 2016-09-19 (×11): 50 mg via ORAL
  Filled 2016-09-14 (×12): qty 1

## 2016-09-14 MED ORDER — NICOTINE 21 MG/24HR TD PT24
21.0000 mg | MEDICATED_PATCH | Freq: Every day | TRANSDERMAL | Status: DC
  Administered 2016-09-14: 21 mg via TRANSDERMAL
  Filled 2016-09-14: qty 1

## 2016-09-14 MED ORDER — METOPROLOL SUCCINATE ER 25 MG PO TB24
25.0000 mg | ORAL_TABLET | Freq: Every day | ORAL | Status: DC
  Administered 2016-09-15: 25 mg via ORAL
  Filled 2016-09-14: qty 1

## 2016-09-14 MED ORDER — ALUM & MAG HYDROXIDE-SIMETH 200-200-20 MG/5ML PO SUSP
30.0000 mL | ORAL | Status: DC | PRN
  Administered 2016-09-14 – 2016-09-16 (×4): 30 mL via ORAL
  Filled 2016-09-14 (×4): qty 30

## 2016-09-14 NOTE — ED Notes (Signed)
Patient states "I can't leave until I get my medication. I need my geodon, ativan, and zofran." Advised patient that she could receive geodon, but ativan and zofran are not able to be given due to too close to time of last administration. Patient verbalized understanding.

## 2016-09-14 NOTE — Progress Notes (Signed)
Patient presents anxious. Reports was having suicidal, homicidal, and AVH but denies currently. Pt reports stressors are that she no longer has her children, and financial. Pt reports being off of meds for over a year. Pt reports anhedonia and extreme anxiety. Admission assessment completed, oriented patient to room and unit. Fluids and nutrition offered.  Skin and contraband search completed and witnessed by Maryelizabeth Kaufmann, Charity fundraiser. No skin issues noted, no contraband found.Pt remains safe on unit with q 15 min checks.

## 2016-09-14 NOTE — ED Notes (Signed)
Gave pt. a soapy washcloth per request

## 2016-09-14 NOTE — ED Provider Notes (Signed)
Pt accepted to [facility]. Will transfer stable.    Samuel Jester, DO 09/14/16 1524

## 2016-09-14 NOTE — Progress Notes (Signed)
Received call from ED RN- informed pt not wanting to transfer to [facility]. CSW spoke with patient via ED phone. Pt explains "she had seizures there and [facility] so doesn't want to be back because of PTSD." CSW spoke with pt at length, validating her concerns while emphasizing need to transfer pt to the facility able to treat pt in most timely manner. After much discussion, pt agreed to transfer voluntarily to [facility]. Notified [facility] RN.  Ilean Skill, MSW, LCSW Clinical Social Work, Disposition  09/14/2016 [phone]

## 2016-09-14 NOTE — ED Notes (Signed)
Counselor spoke with Annice Pih (Pt. Nurse @ [facility]) to confirm admission. Writer has fax voluntary documentation to be signed prior to transport.   Patient is to be admitted to [facility] by Dr. Lucianne Muss .  Attending Physician will be Dr. Jennet Maduro.   Patient has been assigned to room 303, by [facility] Charge Nurse Gwen.   Intake Paper Work has been signed and placed on patient chart.  Ellyn Hack  Patient) Access aware of the admission

## 2016-09-14 NOTE — Tx Team (Signed)
Initial Treatment Plan 09/14/2016 6:08 PM [name] XLK:[id number]    PATIENT STRESSORS: Financial difficulties Health problems Marital or family conflict Medication change or noncompliance Occupational concerns   PATIENT STRENGTHS: Average or above average intelligence Capable of independent living Communication skills General fund of knowledge Motivation for treatment/growth   PATIENT IDENTIFIED PROBLEMS: Major Depressive Disorder  Anxiety                   DISCHARGE CRITERIA:  Improved stabilization in mood, thinking, and/or behavior  PRELIMINARY DISCHARGE PLAN: Outpatient therapy  PATIENT/FAMILY INVOLVEMENT: This treatment plan has been presented to and reviewed with the patient, Melissa Butler, and/or family member,  The patient and family have been given the opportunity to ask questions and make suggestions.  Shelia Media, RN 09/14/2016, 6:08 PM

## 2016-09-14 NOTE — Progress Notes (Signed)
Reviewed pt's chart. Pt assessed by TTS and via telepsych 2/19 and 2/20 and inpatient treatment is recommended.  Followed up on inpatient referrals. Pt also considered for admission to [facility] upon bed availability on the appropriate units.  [facility]- per Sheralyn Boatman, resubmit pt's referral for consideration today [facility]- per August Saucer, same as above [facility]- left voicemail inquiring as to status of referral [facility]- per Agustin Cree, [facility]- same as above  No other bed availability identified at this time, will continue seeking referral options.   Ilean Skill, MSW, LCSW Clinical Social Work, Disposition  09/14/2016 [phone]

## 2016-09-15 DIAGNOSIS — F13239 Sedative, hypnotic or anxiolytic dependence with withdrawal, unspecified: Secondary | ICD-10-CM | POA: Diagnosis present

## 2016-09-15 DIAGNOSIS — F4310 Post-traumatic stress disorder, unspecified: Secondary | ICD-10-CM | POA: Diagnosis present

## 2016-09-15 DIAGNOSIS — F333 Major depressive disorder, recurrent, severe with psychotic symptoms: Principal | ICD-10-CM

## 2016-09-15 DIAGNOSIS — F1320 Sedative, hypnotic or anxiolytic dependence, uncomplicated: Secondary | ICD-10-CM | POA: Diagnosis present

## 2016-09-15 DIAGNOSIS — F13939 Sedative, hypnotic or anxiolytic use, unspecified with withdrawal, unspecified: Secondary | ICD-10-CM | POA: Diagnosis present

## 2016-09-15 LAB — LIPID PANEL
Cholesterol: 147 mg/dL (ref 0–200)
HDL: 31 mg/dL — ABNORMAL LOW (ref >40)
LDL CALC: 85 mg/dL (ref 0–99)
TRIGLYCERIDES: 155 mg/dL — AB (ref <150)
Total CHOL/HDL Ratio: 4.7 RATIO
VLDL: 31 mg/dL (ref 0–40)

## 2016-09-15 LAB — TSH: TSH: 0.759 u[IU]/mL (ref 0.350–4.500)

## 2016-09-15 MED ORDER — CHLORDIAZEPOXIDE HCL 25 MG PO CAPS
25.0000 mg | ORAL_CAPSULE | Freq: Four times a day (QID) | ORAL | Status: AC
  Administered 2016-09-15 – 2016-09-18 (×12): 25 mg via ORAL
  Filled 2016-09-15 (×12): qty 1

## 2016-09-15 MED ORDER — METOPROLOL TARTRATE 25 MG PO TABS
50.0000 mg | ORAL_TABLET | Freq: Two times a day (BID) | ORAL | Status: DC
  Administered 2016-09-15 – 2016-09-18 (×7): 50 mg via ORAL
  Filled 2016-09-15 (×8): qty 2

## 2016-09-15 MED ORDER — ZIPRASIDONE HCL 40 MG PO CAPS
40.0000 mg | ORAL_CAPSULE | Freq: Two times a day (BID) | ORAL | Status: DC
  Administered 2016-09-15 – 2016-09-17 (×4): 40 mg via ORAL
  Filled 2016-09-15 (×4): qty 1

## 2016-09-15 MED ORDER — CLONIDINE HCL 0.1 MG PO TABS
0.1000 mg | ORAL_TABLET | Freq: Two times a day (BID) | ORAL | Status: DC
  Administered 2016-09-15 – 2016-09-18 (×6): 0.1 mg via ORAL
  Filled 2016-09-15 (×7): qty 1

## 2016-09-15 MED ORDER — FLUOXETINE HCL 20 MG PO CAPS
20.0000 mg | ORAL_CAPSULE | Freq: Every day | ORAL | Status: DC
  Administered 2016-09-15 – 2016-09-16 (×2): 20 mg via ORAL
  Filled 2016-09-15 (×2): qty 1

## 2016-09-15 MED ORDER — GABAPENTIN 100 MG PO CAPS
100.0000 mg | ORAL_CAPSULE | Freq: Three times a day (TID) | ORAL | Status: DC
  Administered 2016-09-15 – 2016-09-17 (×5): 100 mg via ORAL
  Filled 2016-09-15 (×5): qty 1

## 2016-09-15 MED ORDER — TRAZODONE HCL 100 MG PO TABS
100.0000 mg | ORAL_TABLET | Freq: Every day | ORAL | Status: DC
  Administered 2016-09-15 – 2016-09-16 (×2): 100 mg via ORAL
  Filled 2016-09-15 (×2): qty 1

## 2016-09-15 MED ORDER — ONDANSETRON HCL 4 MG PO TABS
4.0000 mg | ORAL_TABLET | Freq: Three times a day (TID) | ORAL | Status: DC | PRN
  Administered 2016-09-15 – 2016-09-19 (×8): 4 mg via ORAL
  Filled 2016-09-15 (×8): qty 1

## 2016-09-15 NOTE — Progress Notes (Signed)
Patient ID: Melissa Butler, female   DOB: 11-Sep-1975, 41 y.o.   MRN: [id number] Mostly in her room except snack and medication times, pleasant on approach with spontaneous response to prompt, "I have suicidal and homicidal thoughts, I don't have a plan or a specific person in mind; I just feel on the edge and as if I will jump out of my skin or strangle someone (extending arms out as she spoke); I have been out of my medications; I can't afford them, they are too expensive..." Poor dentition with missing teeth noted, anxious, may be seeking medications, no temper tantrums, no behavioral issues during this shift.

## 2016-09-15 NOTE — Progress Notes (Signed)
Recreation Therapy Notes  INPATIENT RECREATION THERAPY ASSESSMENT  Patient Details Name: Melissa Butler MRN: [id number] DOB: Nov 21, 1975 Today's Date: 09/15/2016  Patient Stressors: Other (Comment) ("Everything")  Coping Skills:   Isolate, Arguments, Avoidance, Exercise, Music, Other (Comment) (Breathing, reading)  Personal Challenges: Anger, Communication, Concentration, Decision-Making, Expressing Yourself, Problem-Solving, Relationships, Self-Esteem/Confidence, Social Interaction, Stress Management, Time Management, Trusting Others  Leisure Interests (2+):  Music - Singing, Community - Counselling psychologist, Individual - Other (Comment) (Go out to eat)  Awareness of Community Resources:  Yes  Community Resources:  Research scientist (physical sciences)  Current Use: No  If no, Barriers?: Other (Comment) (Cannot concentrate)  Patient Strengths:  Paramedic, kind  Patient Identified Areas of Improvement:  How to handle herself  Current Recreation Participation:  Nothing  Patient Goal for Hospitalization:  To get better  [place] of Residence:  [place] of Residence:  [place]   Current [place] (including self-harm):  No  Current HI:  Yes  Consent to Intern Participation: N/A   Jacquelynn Cree, LRT/CTRS 09/15/2016, 3:49 PM

## 2016-09-15 NOTE — Tx Team (Addendum)
Interdisciplinary Treatment and Diagnostic Plan Update  09/15/2016 Time of Session: [place] MRN: [id number]  Principal Diagnosis: Major depressive disorder, recurrent, severe with psychotic features ([place])  Secondary Diagnoses: Principal Problem:   Major depressive disorder, recurrent, severe with psychotic features ([place]) Active Problems:   HTN (hypertension)   GERD (gastroesophageal reflux disease)   Suicidal ideation   Sedative, hypnotic or anxiolytic use disorder, severe, dependence (HCC)   Benzodiazepine withdrawal ([place])   PTSD (post-traumatic stress disorder)   Current Medications:  Current Facility-Administered Medications  Medication Dose Route Frequency Provider Last Rate Last Dose  . acetaminophen (TYLENOL) tablet 650 mg  650 mg Oral Q6H PRN Jolanta B Pucilowska, MD      . albuterol (PROVENTIL) (2.5 MG/3ML) 0.083% nebulizer solution 3 mL  3 mL Inhalation Q6H PRN Jolanta B Pucilowska, MD      . alum & mag hydroxide-simeth (MAALOX/MYLANTA) 200-200-20 MG/5ML suspension 30 mL  30 mL Oral Q4H PRN Jolanta B Pucilowska, MD   30 mL at 09/15/16 0940  . chlordiazePOXIDE (LIBRIUM) capsule 25 mg  25 mg Oral QID Clovis Fredrickson, MD   25 mg at 09/15/16 1130  . FLUoxetine (PROZAC) capsule 20 mg  20 mg Oral QHS Jolanta B Pucilowska, MD      . hydrOXYzine (ATARAX/VISTARIL) tablet 50 mg  50 mg Oral TID PRN Clovis Fredrickson, MD   50 mg at 09/15/16 0802  . magnesium hydroxide (MILK OF MAGNESIA) suspension 30 mL  30 mL Oral Daily PRN Jolanta B Pucilowska, MD      . metoprolol tartrate (LOPRESSOR) tablet 50 mg  50 mg Oral BID Clovis Fredrickson, MD   50 mg at 09/15/16 1129  . pantoprazole (PROTONIX) EC tablet 40 mg  40 mg Oral Daily Clovis Fredrickson, MD   40 mg at 09/15/16 0802  . traZODone (DESYREL) tablet 100 mg  100 mg Oral QHS Jolanta B Pucilowska, MD      . ziprasidone (GEODON) capsule 40 mg  40 mg Oral BID WC Jolanta B Pucilowska, MD       PTA  Medications: Prescriptions Prior to Admission  Medication Sig Dispense Refill Last Dose  . beclomethasone (QVAR) 80 MCG/ACT inhaler Inhale 2 puffs into the lungs 2 (two) times daily.   Taking  . benztropine (COGENTIN) 1 MG tablet Take 1 tablet (1 mg total) by mouth daily. (Patient not taking: Reported on 09/12/2016) 30 tablet 0 Not Taking at Unknown time  . busPIRone (BUSPAR) 10 MG tablet Take 10 mg by mouth 3 (three) times daily.   Taking  . pantoprazole (PROTONIX) 40 MG tablet Take 1 tablet (40 mg total) by mouth daily. (Patient not taking: Reported on 09/12/2016) 30 tablet 0 Not Taking at Unknown time  . sertraline (ZOLOFT) 100 MG tablet Take 100 mg by mouth daily.   Taking    Patient Stressors: Financial difficulties Health problems Marital or family conflict Medication change or noncompliance Occupational concerns  Patient Strengths: Average or above average intelligence Capable of independent living Communication skills General fund of knowledge Motivation for treatment/growth  Treatment Modalities: Medication Management, Group therapy, Case management,  1 to 1 session with clinician, Psychoeducation, Recreational therapy.   Physician Treatment Plan for Primary Diagnosis: Major depressive disorder, recurrent, severe with psychotic features ([place]) Long Term Goal(s): Improvement in symptoms so as ready for discharge NA   Short Term Goals: Ability to identify changes in lifestyle to reduce recurrence of condition will improve Ability to disclose and discuss suicidal ideas Ability  to demonstrate self-control will improve Ability to identify and develop effective coping behaviors will improve Compliance with prescribed medications will improve NA  Medication Management: Evaluate patient's response, side effects, and tolerance of medication regimen.  Therapeutic Interventions: 1 to 1 sessions, Unit Group sessions and Medication administration.  Evaluation of Outcomes: Not  Met  Physician Treatment Plan for Secondary Diagnosis: Principal Problem:   Major depressive disorder, recurrent, severe with psychotic features ([place]) Active Problems:   HTN (hypertension)   GERD (gastroesophageal reflux disease)   Suicidal ideation   Sedative, hypnotic or anxiolytic use disorder, severe, dependence (HCC)   Benzodiazepine withdrawal (HCC)   PTSD (post-traumatic stress disorder)  Long Term Goal(s): Improvement in symptoms so as ready for discharge NA   Short Term Goals: Ability to identify changes in lifestyle to reduce recurrence of condition will improve Ability to disclose and discuss suicidal ideas Ability to demonstrate self-control will improve Ability to identify and develop effective coping behaviors will improve Compliance with prescribed medications will improve NA     Medication Management: Evaluate patient's response, side effects, and tolerance of medication regimen.  Therapeutic Interventions: 1 to 1 sessions, Unit Group sessions and Medication administration.  Evaluation of Outcomes: Not Met   RN Treatment Plan for Primary Diagnosis: Major depressive disorder, recurrent, severe with psychotic features ([place]) Long Term Goal(s): Knowledge of disease and therapeutic regimen to maintain health will improve  Short Term Goals: Ability to verbalize feelings will improve, Ability to disclose and discuss suicidal ideas, Ability to identify and develop effective coping behaviors will improve and Compliance with prescribed medications will improve  Medication Management: RN will administer medications as ordered by provider, will assess and evaluate patient's response and provide education to patient for prescribed medication. RN will report any adverse and/or side effects to prescribing provider.  Therapeutic Interventions: 1 on 1 counseling sessions, Psychoeducation, Medication administration, Evaluate responses to treatment, Monitor vital signs and CBGs as  ordered, Perform/monitor CIWA, COWS, AIMS and Fall Risk screenings as ordered, Perform wound care treatments as ordered.  Evaluation of Outcomes: Not Met   LCSW Treatment Plan for Primary Diagnosis: Major depressive disorder, recurrent, severe with psychotic features ([place]) Long Term Goal(s): Safe transition to appropriate next level of care at discharge, Engage patient in therapeutic group addressing interpersonal concerns.  Short Term Goals: Engage patient in aftercare planning with referrals and resources, Increase social support, Increase ability to appropriately verbalize feelings, Increase emotional regulation and Increase skills for wellness and recovery  Therapeutic Interventions: Assess for all discharge needs, 1 to 1 time with Social worker, Explore available resources and support systems, Assess for adequacy in community support network, Educate family and significant other(s) on suicide prevention, Complete Psychosocial Assessment, Interpersonal group therapy.  Evaluation of Outcomes: Not Met    Recreational Therapy Treatment Plan for Primary Diagnosis: Major depressive disorder, recurrent, severe with psychotic features ([place]) Long Term Goal(s): Interest or engagement in recreation therapeutic interventions will improve  Short Term Goals: Increase self-esteem, Increase stress management skills  Treatment Modalities: Group Therapy and Individual Treatment Sessions  Therapeutic Interventions: Psychoeducation  Evaluation of Outcomes: Progressing   Progress in Treatment: Attending groups: No. Just arrived. Participating in groups: No. Taking medication as prescribed: Yes. Toleration medication: Yes. Family/Significant other contact made: No, will contact:  when given permission Patient understands diagnosis: Yes. Discussing patient identified problems/goals with staff: Yes. Medical problems stabilized or resolved: Yes. Denies suicidal/homicidal ideation: Yes.  Inconsistant. Issues/concerns per patient self-inventory: No. Other: none  New problem(s) identified: No,  Describe:  none  New Short Term/Long Term Goal(s): none  Discharge Plan or Barriers: CSW assessing for appropriate plan.  Reason for Continuation of Hospitalization: Depression Homicidal ideation Medication stabilization Suicidal ideation  Estimated Length of Stay:3-5 days.  Attendees: Patient: Melissa Butler 09/15/2016 12:47 PM  Physician: Dr. Bary Leriche, MD 09/15/2016 12:47 PM  Nursing: Polly Cobia, RN 09/15/2016 12:47 PM  RN Care Manager: 09/15/2016 12:47 PM  Social Worker: Lurline Idol, LCSW 09/15/2016 12:47 PM  Recreational Therapist: Drue Flirt, LRT/CTRS  09/15/2016 12:47 PM  Other:  09/15/2016 12:47 PM  Other:  09/15/2016 12:47 PM  Other: 09/15/2016 12:47 PM    Scribe for Treatment Team: Joanne Chars, [place] 09/15/2016 12:47 PM

## 2016-09-15 NOTE — Plan of Care (Signed)
Problem: Activity: Goal: Sleeping patterns will improve Outcome: Progressing Patient slept for Estimated Hours of 8.15; every 15 minutes safety round maintained, no injury or falls during this shift.    
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
## 2016-09-15 NOTE — Progress Notes (Signed)
D:Patient approached  Writer this am voice of feeling suicidal  And racing thoughts . Patient instructed on safety for the unit and for herself as well . Patient placed in chair in front of the nursing station  Where all could keep her safe . Patient  Sat there for 5 minutes  before letting  Writer know that she was alright . Patient voice  Her feelings to others staff  on the unit . Stated she has not had a shower in a month . She  Was having all kinds of mixed feelings. Patient  Was able to meet with her doctor and treatment Team today . Writer continue to  Educate patient on the  Taking her medication and sharing her thoughts and feelings with staff.  Patient stated slept poor last night .Stated appetite poor and energy level  low Stated concentration is poor . Stated on Depression scale 9, hopeless 9 and anxiety 9 .( low 0-10 high) Denies suicidal   .  No auditory hallucinations  No pain concerns . Appropriate ADL'S. Interacting with peers and staff. Voice of withdrawal  Symptoms  A: Encourage patient participation with unit programming . Instruction  Given on  Medication , verbalize understanding. Reviewed al medications received R: Voice no other concerns. Staff continue to monitor
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
## 2016-09-15 NOTE — Plan of Care (Signed)
Problem: Coping: Goal: Ability to verbalize frustrations and anger appropriately will improve Outcome: Progressing Working  On coping skill, received  Handout    

## 2016-09-15 NOTE — [facility] Group Notes (Signed)
[facility] LCSW Group Therapy Note  Type of Therapy and Topic:  Group Therapy:  Goals Group: SMART Goals  Participation Level:  Patient did not attend group. CSW invited patient to group.   Description of Group:   The purpose of a daily goals group is to assist and guide patients in setting recovery/wellness-related goals.  The objective is to set goals as they relate to the crisis in which they were admitted. Patients will be using SMART goal modalities to set measurable goals.  Characteristics of realistic goals will be discussed and patients will be assisted in setting and processing how one will reach their goal. Facilitator will also assist patients in applying interventions and coping skills learned in psycho-education groups to the SMART goal and process how one will achieve defined goal.  Therapeutic Goals: -Patients will develop and document one goal related to or their crisis in which brought them into treatment. -Patients will be guided by LCSW using SMART goal setting modality in how to set a measurable, attainable, realistic and time sensitive goal.  -Patients will process barriers in reaching goal. -Patients will process interventions in how to overcome and successful in reaching goal.   Summary of Patient Progress:  Patient Goal: Patient did not attend group. CSW invited patient to group.    Therapeutic Modalities:   Motivational Interviewing Engineer, manufacturing systems Therapy Crisis Intervention Model SMART goals setting  Melissa Butler Melissa Butler MSW, [facility] 09/15/2016 10:21 AM

## 2016-09-15 NOTE — [facility] Group Notes (Signed)
[facility] LCSW Group Therapy  09/15/2016 2:20 PM  Type of Therapy:  Group Therapy  Participation Level:  Patient did not attend group. CSW invited patient to group.   Summary of Progress/Problems: Balance in life: Patients will discuss the concept of balance and how it looks and feels to be unbalanced. Pt will identify areas in their life that is unbalanced and ways to become more balanced. They discussed what aspects in their lives has influenced their self care. Patients also discussed self care in the areas of self regulation/control, hygiene/appearance, sleep/relaxation, healthy leisure, healthy eating habits, exercise, inner peace/spirituality, self improvement, sobriety, and health management. They were challenged to identify changes that are needed in order to improve self care.   Cecelia Graciano G. Garnette Czech MSW, LCSWA 09/15/2016, 2:20 PM

## 2016-09-15 NOTE — Progress Notes (Signed)
Recreation Therapy Notes  Date: 02.22.18 Time: 8:30 am Location: Craft Room  Group Topic: Leisure Education  Goal Area(s) Addresses:  Patient will identify things they are grateful for. Patient will identify how being grateful can influence decision making.  Behavioral Response: Left early  Intervention: Grateful Wheel  Activity: Patients were given an I am Grateful For worksheet and were instructed to write things they are grateful for under each category.  Education: LRT educated patients on leisure and why it is important  Education Outcome: Patient left early.   Clinical Observations/Feedback: Patient left group at approximately 9:36 am and did not return to group.  Jacquelynn Cree, LRT/CTRS 09/15/2016 10:15 AM

## 2016-09-16 DIAGNOSIS — F17200 Nicotine dependence, unspecified, uncomplicated: Secondary | ICD-10-CM | POA: Diagnosis present

## 2016-09-16 LAB — HEMOGLOBIN A1C
Hgb A1c MFr Bld: 5 % (ref 4.8–5.6)
MEAN PLASMA GLUCOSE: 97

## 2016-09-16 MED ORDER — NICOTINE 21 MG/24HR TD PT24
21.0000 mg | MEDICATED_PATCH | Freq: Every day | TRANSDERMAL | Status: DC
  Administered 2016-09-16 – 2016-09-19 (×4): 21 mg via TRANSDERMAL
  Filled 2016-09-16 (×4): qty 1

## 2016-09-16 NOTE — Progress Notes (Signed)
Pt to nurse's station multiple times between morning medication administration and mid day medication administration, constantly asking if there is medication for her to take and when next doses are to be administered. Pt came to medication room for mid day dose administration, and began stating that "I can't even get in the shower due to my thoughts of suicide and homicide." Pt reminded that she reported this morning that she no longer is having suicidal thoughts, pt did agree, but currently states she is having thoughts to harm herself and others. Pt denies any specific plans, denies ever actually hurting anyone or herself and went on to state "I never even whipped my kids." Pt is noted to be in her bed most of the morning, lying down. Pt is requesting medication to "help me be able to get a shower." Support and encouragement provided. Safety maintained with every 15 minute checks. Will continue to monitor.

## 2016-09-16 NOTE — Plan of Care (Signed)
Problem: [facility] Participation in Recreation Therapeutic Interventions Goal: STG-Patient will demonstrate improved self esteem by identif STG: Self-Esteem - Within 4 treatment sessions, patient will verbalize at least 5 positive affirmation statements in each of 2 treatment sessions to increase self-esteem.  Outcome: Progressing Treatment Session 1; Completed 1 out of 2: At approximately 3:05 pm, LRT met with patient in consultation room. Patient verbalized 5 positive affirmation statements. Patient reported it felt "good". LRT encouraged patient to continue saying positive affirmation statements.  Leonette Monarch, LRT/CTRS 02.23.18 3:32 pm Goal: STG-Other Recreation Therapy Goal (Specify) STG: Stress Management - Within 4 treatment sessions, patient will verbalize understanding of the stress management techniques in each of 2 treatment sessions to increase stress management skills.  Outcome: Progressing Treatment Session 1; Completed 1 out of 2: At approximately 3:05 pm, LRT met with patient in consultation room. LRT educated and provided patient with handouts on stress management techniques. Patient verbalized understanding. LRT encouraged patient to read over and practice the stress management techniques.  Leonette Monarch, LRT/CTRS 02.23.18 3:34 pm

## 2016-09-16 NOTE — [facility] Group Notes (Signed)
[facility] LCSW Group Therapy  09/16/2016 1:50 PM  Type of Therapy:  Group Therapy  Participation Level:  Patient did not attend group. CSW invited patient to group.   Summary of Progress/Problems: Feelings around Relapse. Group members discussed the meaning of relapse and shared personal stories of relapse, how it affected them and others, and how they perceived themselves during this time. Group members were encouraged to identify triggers, warning signs and coping skills used when facing the possibility of relapse. Social supports were discussed and explored in detail. Patients also discussed facing disappointment and how that can trigger someone to relapse.  Merryl Buckels G. Garnette Czech MSW, LCSWA 09/16/2016, 1:52 PM

## 2016-09-16 NOTE — Progress Notes (Signed)
Pt awake, alert, oriented and up on unit this morning. Pt reports she slept poor last night, woke at 0400 and could not go back to sleep. Reports good appetite, "I ate all my breakfast." Denies suicidal thoughts today, but continues to endorse homicidal thoughts with no plan or specific person reported in which thoughts are related. Pt pleasant and cooperative. Appears anxious, requested and was administered PRN medication as ordered for anxiety. Also reports nausea, PRN medication administered as well. Denies AVH, pain. Pt focused on her medications and when she will take next doses. Pt interacts appropriately with staff/peers. Pt is medication complaint. Support and encouragement provided. Medications administered with education. Safety maintained with every 15 minute checks. Will continue to monitor.
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
## 2016-09-16 NOTE — Progress Notes (Signed)
Patient ID: Melissa Butler, female   DOB: May 26, 1976, 41 y.o.   MRN: [id number] Restless, inpatient-interrupted while talking with other patient in the day area, "I will like to discuss my medication with you ..." Patient received hydroxyzine tablet 50 mg for anxiety and Zofran 4 mg for c/o nausea @ 2012; upcoming medications discussed; patient demonstrating medication-seeking behavior. Medications given at bedtime as scheduled.Still endorsing passive SI/HI, denied AV/H.

## 2016-09-16 NOTE — BHH Group Notes (Signed)
B[facility] Group Notes:  (Nursing/MHT/Case Management/Adjunct)  Date:  09/16/2016  Time:  12:01 AM  Type of Therapy:  Psychoeducational Skills  Participation Level:  Did Not Attend  Participation Quality:Summary of Progress/Problems:  Mayra Neer 09/16/2016, 12:01 AM
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
## 2016-09-16 NOTE — Plan of Care (Signed)
Problem: Medication: Goal: Compliance with prescribed medication regimen will improve Outcome: Progressing Pt compliant with medications. Pt is focused on schedule of medications and utilizes all PRN medications available around the clock.

## 2016-09-16 NOTE — BHH Group Notes (Signed)
[facility] Group Notes:  (Nursing/MHT/Case Management/Adjunct)  Date:  09/16/2016  Time:  9:44 PM  Type of Therapy:  Psychoeducational Skills  Participation Level:  Active  Participation Quality:  Appropriate  Affect:  Appropriate  Cognitive:  Appropriate  Insight:  Appropriate and Good  Engagement in Group:  Engaged  Modes of Intervention:  Discussion, Socialization and Support  Summary of Progress/Problems:  Melissa Butler 09/16/2016, 9:44 PM

## 2016-09-16 NOTE — BHH Suicide Risk Assessment (Signed)
[facility] INPATIENT:  Family/Significant Other Suicide Prevention Education  Suicide Prevention Education:  Education Completed; [name], mother, [phone], has been identified by the patient as the family member/significant other with whom the patient will be residing, and identified as the person(s) who will aid the patient in the event of a mental health crisis (suicidal ideations/suicide attempt).  With written consent from the patient, the family member/significant other has been provided the following suicide prevention education, prior to the and/or following the discharge of the patient.  The suicide prevention education provided includes the following:  Suicide risk factors  Suicide prevention and interventions  National Suicide Hotline telephone number  [name] assessment telephone number  [name]  [facility] and/or Residential Mobile Crisis Unit telephone number  Request made of family/significant other to:  Remove weapons (e.g., guns, rifles, knives), all items previously/currently identified as safety concern.  Melissa Butler reports no guns in her trailer, not aware of any at boyfriend's trailer.  Remove drugs/medications (over-the-counter, prescriptions, illicit drugs), all items previously/currently identified as a safety concern.  The family member/significant other verbalizes understanding of the suicide prevention education information provided.  The family member/significant other agrees to remove the items of safety concern listed above.  Melissa Butler also provided the following information:  Pt lives with boyfriend in a trailer that is walking distance from the trailer where [place] stays with pt's aunt.  Prior to this pt lived with [name] and pt's son, Melissa Butler, did too.  Pt has been hospitalized a number of times psychiatrically recently.  During this time, Melissa Butler stayed with Melissa Butler a lot and pt's daughter, Melissa Butler, filed a DSS  report against Melissa Butler for alcohol use.  This was dropped, but Melissa Butler ended up staying with Melissa Butler (who is 74) and her husband. Neither child will have anything to do with pt currently because they don't like her boyfriend.  Pt has been very depressed about this, sleeping a lot, and her boyfriend called 911 prior to this admission after pt reported "bad thoughts" about SI/HI.  With regards to substance abuse, Melissa Butler is not aware of any.  No alcohol use when pt lived with her, not aware of her using pills.  Pt comes to see Melissa Butler daily and Melissa Butler has not had concerns.  PT was hospitalized one time recently at [name] and had a seizure on the way home.  There have been concerns for seizures since and Melissa Butler has seen pt appearing shaky several times, which she thinks was related to the siezure.  Melissa Frederick, LCSW 09/16/2016, 10:48 AM

## 2016-09-16 NOTE — Plan of Care (Signed)
Problem: Activity: Goal: Sleeping patterns will improve Outcome: Progressing Patient slept for Estimated Hours of 8.30; every 15 minutes safety round maintained, no injury or falls during this shift.    

## 2016-09-16 NOTE — Progress Notes (Signed)
Recreation Therapy Notes  Date: 02.23.18 Time: 9:30 am Location: Craft Room  Group Topic: Coping Skills  Goal Area(s) Addresses:  Patient will participate in a healthy coping skill. Patient will verbalize benefit of using art as a coping skill.  Behavioral Response: Did not attend  Intervention: Coloring  Activity: Patients were given coloring sheets to color and were instructed to think about the emotions they were feeling as well as what their minds were focused on.  Education: LRT educated patients on healthy coping skills.  Education Outcome: Patient did not attend group.   Clinical Observations/Feedback: Patient did not attend group.  Jacquelynn Cree, LRT/CTRS 09/16/2016 10:25 AM

## 2016-09-17 MED ORDER — GABAPENTIN 100 MG PO CAPS
200.0000 mg | ORAL_CAPSULE | Freq: Three times a day (TID) | ORAL | Status: DC
  Administered 2016-09-17 – 2016-09-19 (×6): 200 mg via ORAL
  Filled 2016-09-17 (×6): qty 2

## 2016-09-17 MED ORDER — TRAZODONE HCL 100 MG PO TABS
200.0000 mg | ORAL_TABLET | Freq: Every day | ORAL | Status: DC
  Administered 2016-09-17 – 2016-09-18 (×2): 200 mg via ORAL
  Filled 2016-09-17 (×2): qty 2

## 2016-09-17 MED ORDER — FLUOXETINE HCL 20 MG PO CAPS
40.0000 mg | ORAL_CAPSULE | Freq: Every day | ORAL | Status: DC
  Administered 2016-09-17 – 2016-09-18 (×2): 40 mg via ORAL
  Filled 2016-09-17 (×2): qty 2

## 2016-09-17 MED ORDER — ZIPRASIDONE HCL 40 MG PO CAPS
80.0000 mg | ORAL_CAPSULE | Freq: Two times a day (BID) | ORAL | Status: DC
  Administered 2016-09-17 – 2016-09-19 (×4): 80 mg via ORAL
  Filled 2016-09-17 (×4): qty 2

## 2016-09-17 NOTE — Progress Notes (Signed)
Denies SI/HI/AVH at this time. Pt intrusive. Many somatic complaints. Presents many times to RN station with multiple requests at different times throughout shift. Pt c/o anxiety and heart burn. PRNs given were effective. Tylenol given for menstrual cramps, effective.  Attended evening group. Voices no additional concerns at this time. Safety maintained. Will continue to monitor.

## 2016-09-17 NOTE — Progress Notes (Signed)
D:Patient remains intrusive , somatic complaints  through out the shift . Patient stated she is on her meneses  And having a headache s Pain level unchange . Voice of being  Bipolar  Moods going up and down.  Patient stated slept fair last night .Stated appetitefair and energy level  Is normal. Stated concentration is good . Stated on Depression scale 5 , hopeless 5 and anxiety 7 .( low 0-10 high) Denies suicidal  homicidal ideations  .  No auditory hallucinations  No pain concerns . Appropriate ADL'S. Interacting with peers and staff.  Continue  To focus on withdrawal  Symptoms A: Encourage patient participation with unit programming . Instruction  Given on  Medication , verbalize understanding. R: Voice no other concerns. Staff continue to monitor
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
## 2016-09-17 NOTE — Plan of Care (Signed)
Problem: Safety: Goal: Periods of time without injury will increase Outcome: Progressing Pt has remained free from injury this shift.   

## 2016-09-17 NOTE — Plan of Care (Signed)
Problem: Coping: Goal: Ability to demonstrate self-control will improve Outcome: Progressing Working on Pharmacologist

## 2016-09-17 NOTE — Plan of Care (Signed)
Problem: Activity: Goal: Imbalance in normal sleep/wake cycle will improve Outcome: Progressing Pt sleeping 7 or more hours per night.

## 2016-09-18 MED ORDER — PANTOPRAZOLE SODIUM 40 MG PO TBEC: 40.0000 mg | DELAYED_RELEASE_TABLET | Freq: Every day | ORAL | 1 refills | Status: AC

## 2016-09-18 MED ORDER — METOPROLOL TARTRATE 50 MG PO TABS: 50.0000 mg | ORAL_TABLET | Freq: Two times a day (BID) | ORAL | 1 refills | Status: DC

## 2016-09-18 MED ORDER — TRAZODONE HCL 100 MG PO TABS: 200.0000 mg | ORAL_TABLET | Freq: Every day | ORAL | 1 refills | Status: DC

## 2016-09-18 MED ORDER — ALBUTEROL SULFATE (2.5 MG/3ML) 0.083% IN NEBU: 3.0000 mL | INHALATION_SOLUTION | Freq: Four times a day (QID) | RESPIRATORY_TRACT | 12 refills | Status: DC | PRN

## 2016-09-18 MED ORDER — FLUOXETINE HCL 40 MG PO CAPS: 40.0000 mg | ORAL_CAPSULE | Freq: Every day | ORAL | 1 refills | Status: DC

## 2016-09-18 MED ORDER — HYDROXYZINE HCL 50 MG PO TABS: 50.0000 mg | ORAL_TABLET | Freq: Three times a day (TID) | ORAL | 1 refills | Status: DC | PRN

## 2016-09-18 MED ORDER — METOPROLOL TARTRATE 50 MG PO TABS: 50.0000 mg | ORAL_TABLET | Freq: Two times a day (BID) | ORAL | 1 refills | Status: AC

## 2016-09-18 MED ORDER — FLUOXETINE HCL 40 MG PO CAPS: 40.0000 mg | ORAL_CAPSULE | Freq: Every day | ORAL | 1 refills | Status: AC

## 2016-09-18 MED ORDER — METOPROLOL TARTRATE 25 MG PO TABS
25.0000 mg | ORAL_TABLET | Freq: Two times a day (BID) | ORAL | Status: DC
  Administered 2016-09-18 – 2016-09-19 (×2): 25 mg via ORAL
  Filled 2016-09-18 (×2): qty 1

## 2016-09-18 MED ORDER — ZIPRASIDONE HCL 80 MG PO CAPS: 80.0000 mg | ORAL_CAPSULE | Freq: Two times a day (BID) | ORAL | 1 refills | Status: DC

## 2016-09-18 MED ORDER — GABAPENTIN 100 MG PO CAPS: 200.0000 mg | ORAL_CAPSULE | Freq: Three times a day (TID) | ORAL | 1 refills | Status: AC

## 2016-09-18 MED ORDER — TRAZODONE HCL 100 MG PO TABS: 200.0000 mg | ORAL_TABLET | Freq: Every day | ORAL | 1 refills | Status: AC

## 2016-09-18 MED ORDER — HYDROXYZINE HCL 50 MG PO TABS: 50.0000 mg | ORAL_TABLET | Freq: Three times a day (TID) | ORAL | 1 refills | Status: AC | PRN

## 2016-09-18 MED ORDER — ZIPRASIDONE HCL 80 MG PO CAPS: 80.0000 mg | ORAL_CAPSULE | Freq: Two times a day (BID) | ORAL | 1 refills | Status: AC

## 2016-09-18 MED ORDER — METOPROLOL TARTRATE 25 MG PO TABS: 25.0000 mg | ORAL_TABLET | Freq: Two times a day (BID) | ORAL | 1 refills | Status: AC

## 2016-09-18 MED ORDER — GABAPENTIN 100 MG PO CAPS: 200.0000 mg | ORAL_CAPSULE | Freq: Three times a day (TID) | ORAL | 1 refills | Status: DC

## 2016-09-18 MED ORDER — PANTOPRAZOLE SODIUM 40 MG PO TBEC: 40.0000 mg | DELAYED_RELEASE_TABLET | Freq: Every day | ORAL | 1 refills | Status: DC

## 2016-09-18 NOTE — Plan of Care (Signed)
Problem: Health Behavior/Discharge Planning: Goal: Ability to manage health-related needs will improve Outcome: Progressing Patient medication compliant.    

## 2016-09-18 NOTE — Progress Notes (Signed)
Denies SI/HI/AVH at this time.  Pt c/o anxiety and nausea. PRNs given were effective. Attended evening group. Voices no additional concerns at this time. Safety maintained. Will continue to monitor.
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
## 2016-09-18 NOTE — Progress Notes (Signed)
Patient stated that she felt like she was improving in her mood and in general.  She however complained of feeling nauseous and anxious and PRN medication(Zofran and Vistaril respectively) was given with relief verbalized. At noon, patient asked for her Librium and was upset when writer explained to her that the dose was complete. "So what am I supposed to do?" Patient encouraged to utilize coping skills(groups, diversional activities and interaction with peers). She verbalized understanding. Support given to patient. Patient denied SI/AVH and contracted for safety.

## 2016-09-18 NOTE — Progress Notes (Signed)
Denies SI/HI/AVH at this time. Pt intrusive. Many somatic complaints r/t menstrual cycle . Presents many times to RN station with multiple requests at different times throughout shift. Pt c/o anxiety and nausea. PRNs given were effective. Attended evening group. Voices no additional concerns at this time. Safety maintained. Will continue to monitor

## 2016-09-18 NOTE — [facility] Group Notes (Signed)
[facility] LCSW Group Therapy  09/18/2016 9:23 AM  Type of Therapy:  Group Therapy  Participation Level:  Active  Participation Quality:  Attentive  Affect:  Appropriate  Cognitive:  Alert  Insight:  Improving  Engagement in Therapy:  Improving  Modes of Intervention:  Activity, Discussion, Education, Orientation, Reality Testing and Support  Summary of Progress/Problems: Coping Skills: Patients defined and discussed healthy coping skills. Patients identified healthy coping skills they would like to try during hospitalization and after discharge. CSW offered insight to varying coping skills that may have been new to patients such as practicing mindfulness.  Gagandeep Kossman G. Garnette Czech MSW, [facility] 09/18/2016 9:25 AM

## 2016-09-18 NOTE — [facility] Group Notes (Signed)
[facility] Group Notes:  (Nursing/MHT/Case Management/Adjunct)  Date:  09/18/2016  Time:  1:57 AM  Type of Therapy:  Psychoeducational Skills  Participation Level:  Active  Participation Quality:  Appropriate  Affect:  Appropriate  Cognitive:  Alert  Insight:  Good  Engagement in Group:  Engaged  Modes of Intervention:  Activity  Summary of Progress/Problems:  Melissa Butler 09/18/2016, 1:57 AM

## 2016-09-18 NOTE — [facility] Group Notes (Signed)
[facility] LCSW Group Therapy  09/18/2016 3:12 PM  Type of Therapy:  Group Therapy  Participation Level:  Active  Participation Quality:  Appropriate and Sharing  Affect:  Appropriate  Cognitive:  Alert  Insight:  Engaged  Engagement in Therapy:  Engaged  Modes of Intervention:  Activity, Discussion, Education, Problem-solving, Pharmacist, community of Progress/Problems: Recognizing Triggers: Patients defined triggers and discussed the importance of recognizing their personal warning signs. Patients identified their own triggers and how they tend to cope with stressful situations. Patients discussed areas such as people, places, things, and thoughts that rigger certain emotions for them. CSW provided support to patients and discussed safety planning for when these triggers occur. Group participants had opportunities to share openly with the group and participate in a group discussion while providing support and feedback to their peers.  Melody Savidge G. Garnette Czech MSW, LCSWA 09/18/2016, 3:16 PM

## 2016-09-18 NOTE — [facility] Group Notes (Signed)
[facility] Group Notes:  (Nursing/MHT/Case Management/Adjunct)  Date:  09/18/2016  Time:  9:57 PM  Type of Therapy:  Group Therapy  Participation Level:  Active  Participation Quality:  Appropriate  Affect:  Appropriate  Cognitive:  Appropriate  Insight:  Appropriate  Engagement in Group:  Engaged  Modes of Intervention:  n/a  Summary of Progress/Problems:  Veva Holes 09/18/2016, 9:57 PM

## 2016-09-19 NOTE — Progress Notes (Signed)
Recreation Therapy Notes  INPATIENT RECREATION TR PLAN  Patient Details Name: Melissa Butler MRN: [id number] DOB: 07/15/1976 Today's Date: 09/19/2016  Rec Therapy Plan Is patient appropriate for Therapeutic Recreation?: Yes Treatment times per week: At least once a week TR Treatment/Interventions: 1:1 session, Group participation (Comment) (Appropriate participation in daily recreational therapy tx)  Discharge Criteria Pt will be discharged from therapy if:: Treatment goals are met, Discharged Treatment plan/goals/alternatives discussed and agreed upon by:: Patient/family  Discharge Summary Short term goals set: See Care Plan Short term goals met: Complete Progress toward goals comments: One-to-one attended Which groups?: Other (Comment) (Self-expression) One-to-one attended: Self-esteem, stress management Reason goals not met: N/A Therapeutic equipment acquired: None Reason patient discharged from therapy: Discharge from hospital Pt/family agrees with progress & goals achieved: Yes Date patient discharged from therapy: 09/19/16   , M, LRT/CTRS 09/19/2016, 2:51 PM  

## 2016-09-19 NOTE — Plan of Care (Signed)
Problem: [facility] Participation in Recreation Therapeutic Interventions Goal: STG-Patient will demonstrate improved self esteem by identif STG: Self-Esteem - Within 4 treatment sessions, patient will verbalize at least 5 positive affirmation statements in each of 2 treatment sessions to increase self-esteem.  Outcome: Completed/Met Date Met: 09/19/16 Treatment Session 2; Completed 2 out of 2: At approximately 8:40 am, LRT met with patient in craft room. Patient verbalized 5 positive affirmation statements. Patient reported it felt "good". LRT encouraged patient to continue saying positive affirmation statements.   Leonette Monarch, LRT/CTRS 02.26.18 12:01 pm Goal: STG-Other Recreation Therapy Goal (Specify) STG: Stress Management - Within 4 treatment sessions, patient will verbalize understanding of the stress management techniques in each of 2 treatment sessions to increase stress management skills.  Outcome: Completed/Met Date Met: 09/19/16 Treatment Session 2; Completed 2 out of 2: At approximately 8:40 am, LRT met with patient in craft room. Patient reported she read over and practiced the stress management techniques. Patient verbalized understanding and reported the techniques were helpful. LRT encouraged patient to continue practicing the stress management techniques.  Leonette Monarch, LRT/CTRS 02.26.18 12:02 pm

## 2016-09-19 NOTE — [facility] Suicide Risk Assessment (Signed)
[facility] Discharge Suicide Risk Assessment   Principal Problem: Bipolar 2 disorder, major depressive episode [facility]) Discharge Diagnoses:  Patient Active Problem List   Diagnosis Date Noted  . Tobacco use disorder [F17.200] 09/16/2016  . Sedative, hypnotic or anxiolytic use disorder, severe, dependence (HCC) [F13.20] 09/15/2016  . Benzodiazepine withdrawal (HCC) [F13.239] 09/15/2016  . PTSD (post-traumatic stress disorder) [F43.10] 09/15/2016  . HTN (hypertension) [I10] 09/14/2016  . GERD (gastroesophageal reflux disease) [K21.9] 09/14/2016  . Suicidal ideation [R45.851] 09/14/2016  . Bipolar 2 disorder, major depressive episode (HCC) [F31.81] 09/13/2016  . Insomnia [G47.00]   . Anxiety [F41.9]   . 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 Panic disorder [F41.0] 01/04/2016  . NSTEMI (non-ST elevated myocardial infarction) (HCC) [I21.4] 11/06/2015  . Normal coronary arteries 11/06/15 [Z03.89] 11/06/2015    Total Time spent with patient: 30 minutes  Musculoskeletal: Strength & Muscle Tone: within normal limits Gait & Station: normal Patient leans: N/A  Psychiatric Specialty Exam: Review of Systems  Psychiatric/Behavioral: The patient is nervous/anxious.   All other systems reviewed and are negative.   Blood pressure 108/67, pulse 71, temperature 98.2 F (36.8 C), temperature source Oral, resp. rate 18, height 5\' 3"  (1.6 m), weight 87.1 kg (192 lb), SpO2 99 %.Body mass index is 34.01 kg/m.  General Appearance: Casual  Eye Contact::  Good  Speech:  Clear and Coherent409  Volume:  Normal  Mood:  Anxious  Affect:  Appropriate  Thought Process:  Goal Directed and Descriptions of Associations: Intact  Orientation:  Full (Time, Place, and Person)  Thought Content:  WDL  Suicidal Thoughts:  No  Homicidal Thoughts:  No  Memory:  Immediate;   Fair Recent;   Fair Remote;   Fair  Judgement:  Impaired  Insight:  Shallow  Psychomotor Activity:  Normal  Concentration:  Fair   Recall:  [employer] of Knowledge:Fair  Language: Fair  Akathisia:  No  Handed:  Right  AIMS (if indicated):     Assets:  Communication Skills Desire for Improvement Financial Resources/Insurance Housing Physical Health Resilience Social Support  Sleep:  Number of Hours: 8  Cognition: WNL  ADL's:  Intact   Mental Status Per Nursing Assessment::   On Admission:  NA  Demographic Factors:  Caucasian and Unemployed  Loss Factors: Loss of significant relationship and Financial problems/change in socioeconomic status  Historical Factors: Prior suicide attempts, Family history of mental illness or substance abuse and Impulsivity  Risk Reduction Factors:   Sense of responsibility to family, Living with another person, especially a relative and Positive social support  Continued Clinical Symptoms:  Bipolar Disorder:   Depressive phase Alcohol/Substance Abuse/Dependencies  Cognitive Features That Contribute To Risk:  None    Suicide Risk:  Minimal: No identifiable suicidal ideation.  Patients presenting with no risk factors but with morbid ruminations; may be classified as minimal risk based on the severity of the depressive symptoms  Follow-up Information    [facility]. Go on 09/23/2016.   Why:  Please attend the walk in clinic on 09/23/16 between 8am-3pm.  Arriving between 8-9am is suggested.  Please bring photo ID and a copy of your hospital discharge paperwork. Contact information: [address] [place] [address] [phone]           Plan Of Care/Follow-up recommendations:  Activity:  as tolerated. Diet:  low sodium heart healthy. Other:  keep follow up appointments.  Kristine Linea, MD 09/19/2016, 8:36 AM

## 2016-09-19 NOTE — Progress Notes (Signed)
Pt awake, alert, oriented and up on unit this morning. Improvement noted in mood, brighter. Reports good sleep last night with the help of sleep medication, good appetite, high energy level. Rates depression 4/10, hopelessness 2/10, anxiety 8/10 (low 0-10 high). Complains of headache and stomach pain, PRN medications administered as ordered. Goal today is "going home" by "continue to do better." Pt voices she is ready to go home. Denies SI/HI/AVH. Medication compliant. Support and encouragement provided. Medications administered as ordered with education. Safety maintained with every 15 minute checks. Will continue to monitor.
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
## 2016-09-19 NOTE — Progress Notes (Signed)
  [facility] Adult Case Management Discharge Plan :  Will you be returning to the same living situation after discharge:  Yes,  with boyfriend At discharge, do you have transportation home?: Yes,  mother Do you have the ability to pay for your medications: No. Will request [employer] assistance.  Release of information consent forms completed and in the chart;  Patient's signature needed at discharge.  Patient to Follow up at: Follow-up Information    [facility]. Go on 09/23/2016.   Why:  Please attend the walk in clinic on 09/23/16 between 8am-3pm.  Arriving between 8-9am is suggested.  Please bring photo ID and a copy of your hospital discharge paperwork. Contact information: [address] [place] [address] [phone]           Next level of care provider has access to [facility] Link:no  Safety Planning and Suicide Prevention discussed: Yes,  with mother  Have you used any form of tobacco in the last 30 days? (Cigarettes, Smokeless Tobacco, Cigars, and/or Pipes): Yes  Has patient been referred to the Quitline?: N/A patient is not a smoker  Patient has been referred for addiction treatment: Yes  [name], LCSW 09/19/2016, 2:28 PM

## 2016-09-19 NOTE — Progress Notes (Signed)
09/19/16, [address] [employer], [place].  Pt was receiving community support services prior to losing her medicaid in January.  She was discharged from that service.   Daleen Squibb, LCSW

## 2016-09-19 NOTE — Progress Notes (Signed)
Recreation Therapy Notes  Date: 02.26.18 Time: 9:30 am Location: Craft Room  Group Topic: Self-expression  Goal Area(s) Addresses:  Patient will identify one color per emotion listed on wheel. Patient will verbalize benefit of using art as a means of self-expression. Patient will verbalize on emotion experienced during session. Patient will be educated on other forms of self-expression.  Behavioral Response: Attentive, Left early  Intervention: Emotion Wheel  Activity: Patients were given an Emotion Wheel worksheet and were instructed to pick a color for each emotion listed on the wheel.  Education: LRT educated patients on other forms of self-expression.  Education Outcome: Patient left before LRT educated group.  Clinical Observations/Feedback: Patient picked a color for each emotion. Patient left group at approximately 9:55 am stating she needed to lay down because her stomach hurt.  Jacquelynn Cree, LRT/CTRS 09/19/2016 10:19 AM

## 2016-09-19 NOTE — Progress Notes (Signed)
Provided and reviewed discharge paperwork and prescriptions. Verified understanding by use of teach back method. Verbalizes understanding as well. Denies SI/HI/AVH, pain. Pt belongings returned at discharge from pt specific locker. 7 day supply of medications given from pharmacy to take home. Pt reports she has a family member who can provide transportation home. Safety maintained. Will continue to monitor.

## 2017-04-08 ENCOUNTER — Encounter (HOSPITAL_BASED_OUTPATIENT_CLINIC_OR_DEPARTMENT_OTHER): Payer: Self-pay | Admitting: *Deleted

## 2017-04-08 ENCOUNTER — Emergency Department (HOSPITAL_BASED_OUTPATIENT_CLINIC_OR_DEPARTMENT_OTHER)
Admission: EM | Admit: 2017-04-08 | Discharge: 2017-04-09 | Disposition: A | Discharge status: Home / Self Care | Payer: No Typology Code available for payment source | Attending: Emergency Medicine | Admitting: Emergency Medicine

## 2017-04-08 DIAGNOSIS — F17210 Nicotine dependence, cigarettes, uncomplicated: Secondary | ICD-10-CM | POA: Insufficient documentation

## 2017-04-08 DIAGNOSIS — F3289 Other specified depressive episodes: Secondary | ICD-10-CM

## 2017-04-08 DIAGNOSIS — R45851 Suicidal ideations: Secondary | ICD-10-CM | POA: Insufficient documentation

## 2017-04-08 DIAGNOSIS — I252 Old myocardial infarction: Secondary | ICD-10-CM | POA: Insufficient documentation

## 2017-04-08 DIAGNOSIS — F319 Bipolar disorder, unspecified: Secondary | ICD-10-CM | POA: Diagnosis present

## 2017-04-08 DIAGNOSIS — F3181 Bipolar II disorder: Secondary | ICD-10-CM | POA: Diagnosis present

## 2017-04-08 DIAGNOSIS — I10 Essential (primary) hypertension: Secondary | ICD-10-CM | POA: Insufficient documentation

## 2017-04-08 DIAGNOSIS — F315 Bipolar disorder, current episode depressed, severe, with psychotic features: Secondary | ICD-10-CM | POA: Insufficient documentation

## 2017-04-08 DIAGNOSIS — Z79899 Other long term (current) drug therapy: Secondary | ICD-10-CM | POA: Insufficient documentation

## 2017-04-08 LAB — CBC WITH DIFFERENTIAL/PLATELET
Basophils Absolute: 0.1 10*3/uL (ref 0.0–0.1)
Basophils Relative: 1 %
Eosinophils Absolute: 0.3 10*3/uL (ref 0.0–0.7)
Eosinophils Relative: 2 %
HCT: 44.2 % (ref 36.0–46.0)
Hemoglobin: 14.5 g/dL (ref 12.0–15.0)
Lymphocytes Relative: 22 %
Lymphs Abs: 3.2 10*3/uL (ref 0.7–4.0)
MCH: 29.4 pg (ref 26.0–34.0)
MCHC: 32.8 g/dL (ref 30.0–36.0)
MCV: 89.7 fL (ref 78.0–100.0)
Monocytes Absolute: 1.3 10*3/uL — ABNORMAL HIGH (ref 0.1–1.0)
Monocytes Relative: 9 %
Neutro Abs: 10 10*3/uL — ABNORMAL HIGH (ref 1.7–7.7)
Neutrophils Relative %: 66 %
Platelets: 275 10*3/uL (ref 150–400)
RBC: 4.93 MIL/uL (ref 3.87–5.11)
RDW: 16.3 % — ABNORMAL HIGH (ref 11.5–15.5)
WBC: 14.8 10*3/uL — ABNORMAL HIGH (ref 4.0–10.5)

## 2017-04-08 LAB — URINALYSIS, ROUTINE W REFLEX MICROSCOPIC
Bilirubin Urine: NEGATIVE
GLUCOSE, UA: NEGATIVE mg/dL
KETONES UR: NEGATIVE mg/dL
Nitrite: NEGATIVE
PROTEIN: 100 mg/dL — AB
Specific Gravity, Urine: 1.01 (ref 1.005–1.030)
pH: 6 (ref 5.0–8.0)

## 2017-04-08 LAB — ETHANOL: Alcohol, Ethyl (B): 22 mg/dL — ABNORMAL HIGH (ref <5)

## 2017-04-08 LAB — COMPREHENSIVE METABOLIC PANEL
ALT: 29 U/L (ref 14–54)
AST: 27 U/L (ref 15–41)
Albumin: 4.1 g/dL (ref 3.5–5.0)
Alkaline Phosphatase: 90 U/L (ref 38–126)
Anion gap: 11 (ref 5–15)
BUN: 15 mg/dL (ref 6–20)
CO2: 22 mmol/L (ref 22–32)
Calcium: 8.8 mg/dL — ABNORMAL LOW (ref 8.9–10.3)
Chloride: 105 mmol/L (ref 101–111)
Creatinine, Ser: 0.77 mg/dL (ref 0.44–1.00)
GFR calc Af Amer: >60 mL/min (ref >60)
GFR calc non Af Amer: >60 mL/min (ref >60)
Glucose, Bld: 115 mg/dL — ABNORMAL HIGH (ref 65–99)
Potassium: 3.3 mmol/L — ABNORMAL LOW (ref 3.5–5.1)
Sodium: 138 mmol/L (ref 135–145)
Total Bilirubin: 0.3 mg/dL (ref 0.3–1.2)
Total Protein: 7 g/dL (ref 6.5–8.1)

## 2017-04-08 LAB — RAPID URINE DRUG SCREEN, HOSP PERFORMED
Amphetamines: NOT DETECTED
Barbiturates: NOT DETECTED
Benzodiazepines: NOT DETECTED
Cocaine: NOT DETECTED
Opiates: NOT DETECTED
Tetrahydrocannabinol: NOT DETECTED

## 2017-04-08 LAB — PREGNANCY, URINE: Preg Test, Ur: NEGATIVE

## 2017-04-08 LAB — URINALYSIS, MICROSCOPIC (REFLEX): BACTERIA UA: NONE SEEN

## 2017-04-08 LAB — SALICYLATE LEVEL: Salicylate Lvl: <7 mg/dL (ref 2.8–30.0)

## 2017-04-08 LAB — ACETAMINOPHEN LEVEL: Acetaminophen (Tylenol), Serum: <10 ug/mL — ABNORMAL LOW (ref 10–30)

## 2017-04-08 MED ORDER — BECLOMETHASONE DIPROP HFA 40 MCG/ACT IN AERB
4.0000 | INHALATION_SPRAY | Freq: Two times a day (BID) | RESPIRATORY_TRACT | Status: DC
  Administered 2017-04-09: 4 via RESPIRATORY_TRACT
  Filled 2017-04-08: qty 10.6

## 2017-04-08 MED ORDER — SODIUM CHLORIDE 0.9 % IV BOLUS (SEPSIS): 1000.0000 mL | Freq: Once | INTRAVENOUS | Status: DC

## 2017-04-08 MED ORDER — HYDROXYZINE HCL 25 MG PO TABS
25.0000 mg | ORAL_TABLET | Freq: Three times a day (TID) | ORAL | Status: DC | PRN
  Administered 2017-04-08: 25 mg via ORAL
  Filled 2017-04-08: qty 1

## 2017-04-08 MED ORDER — FLUOXETINE HCL 20 MG PO CAPS
20.0000 mg | ORAL_CAPSULE | Freq: Every day | ORAL | Status: DC
  Administered 2017-04-08: 20 mg via ORAL
  Filled 2017-04-08: qty 1

## 2017-04-08 MED ORDER — FLUOXETINE HCL 20 MG PO CAPS: 40.0000 mg | ORAL_CAPSULE | Freq: Every day | ORAL | Status: DC

## 2017-04-08 MED ORDER — HYDROXYZINE HCL 25 MG PO TABS
50.0000 mg | ORAL_TABLET | Freq: Three times a day (TID) | ORAL | Status: DC | PRN
  Administered 2017-04-08: 50 mg via ORAL
  Filled 2017-04-08: qty 2

## 2017-04-08 MED ORDER — GABAPENTIN 100 MG PO CAPS
200.0000 mg | ORAL_CAPSULE | Freq: Three times a day (TID) | ORAL | Status: DC
  Administered 2017-04-08 – 2017-04-09 (×4): 200 mg via ORAL
  Filled 2017-04-08 (×5): qty 2

## 2017-04-08 MED ORDER — ALUM & MAG HYDROXIDE-SIMETH 200-200-20 MG/5ML PO SUSP
30.0000 mL | Freq: Four times a day (QID) | ORAL | Status: DC | PRN
  Administered 2017-04-08: 30 mL via ORAL
  Filled 2017-04-08: qty 30

## 2017-04-08 MED ORDER — PANTOPRAZOLE SODIUM 40 MG PO TBEC
40.0000 mg | DELAYED_RELEASE_TABLET | Freq: Every day | ORAL | Status: DC
  Administered 2017-04-08 – 2017-04-09 (×2): 40 mg via ORAL
  Filled 2017-04-08 (×3): qty 1

## 2017-04-08 MED ORDER — ACETAMINOPHEN 325 MG PO TABS: 650.0000 mg | ORAL_TABLET | Freq: Four times a day (QID) | ORAL | Status: DC | PRN

## 2017-04-08 MED ORDER — BECLOMETHASONE DIPROPIONATE 80 MCG/ACT IN AERS
2.0000 | INHALATION_SPRAY | Freq: Two times a day (BID) | RESPIRATORY_TRACT | Status: DC
  Filled 2017-04-08: qty 8.7

## 2017-04-08 MED ORDER — HALOPERIDOL LACTATE 5 MG/ML IJ SOLN
2.0000 mg | Freq: Once | INTRAMUSCULAR | Status: AC
  Administered 2017-04-08: 2 mg via INTRAMUSCULAR
  Filled 2017-04-08: qty 1

## 2017-04-08 MED ORDER — METOPROLOL TARTRATE 25 MG PO TABS
50.0000 mg | ORAL_TABLET | Freq: Two times a day (BID) | ORAL | Status: DC
  Administered 2017-04-08 – 2017-04-09 (×3): 50 mg via ORAL
  Filled 2017-04-08 (×3): qty 2
  Filled 2017-04-08: qty 1

## 2017-04-08 MED ORDER — ZIPRASIDONE HCL 20 MG PO CAPS: 40.0000 mg | ORAL_CAPSULE | Freq: Every day | ORAL | Status: DC

## 2017-04-08 MED ORDER — NICOTINE 21 MG/24HR TD PT24
21.0000 mg | MEDICATED_PATCH | Freq: Every day | TRANSDERMAL | Status: DC
  Administered 2017-04-08 (×2): 21 mg via TRANSDERMAL
  Filled 2017-04-08 (×3): qty 1

## 2017-04-08 MED ORDER — ALBUTEROL SULFATE (2.5 MG/3ML) 0.083% IN NEBU: 3.0000 mL | INHALATION_SOLUTION | Freq: Four times a day (QID) | RESPIRATORY_TRACT | Status: DC | PRN

## 2017-04-08 MED ORDER — ZIPRASIDONE HCL 20 MG PO CAPS
80.0000 mg | ORAL_CAPSULE | Freq: Two times a day (BID) | ORAL | Status: DC
  Administered 2017-04-08: 80 mg via ORAL
  Filled 2017-04-08: qty 4
  Filled 2017-04-08: qty 1

## 2017-04-08 NOTE — ED Notes (Signed)
Called staffing office and spoke with Delice Bison regarding need for sitter.

## 2017-04-08 NOTE — ED Triage Notes (Signed)
Pt states that she has been out of medication x 2 months has thoughts of wanting to harm herself and others.

## 2017-04-08 NOTE — ED Notes (Signed)
Patient is resting comfortably. 

## 2017-04-08 NOTE — ED Provider Notes (Signed)
[facility] Note   CSN: [id number] Arrival date & time: 04/08/17  0107     History   Chief Complaint Chief Complaint  Patient presents with  . Suicidal    HPI Melissa Butler is a 41 y.o. female.  The history is provided by the patient.  Mental Health Problem  Presenting symptoms: depression and suicidal thoughts   Presenting symptoms: no hallucinations, no homicidal ideas, no suicidal threats and no suicide attempt   Patient accompanied by: none. Degree of incapacity (severity):  Severe Onset quality:  Sudden Timing:  Constant Progression:  Unchanged Chronicity:  New Context: alcohol use and noncompliance   Treatment compliance:  Untreated Time since last psychoactive medication taken:  3 months Relieved by:  Nothing Worsened by:  Nothing Ineffective treatments:  None tried Associated symptoms: no chest pain and no euphoric mood   Risk factors: no recent psychiatric admission     Past Medical History:  Diagnosis Date  . Anxiety   . Chronic abdominal pain   . Chronic pelvic pain in female   . Depression   . GERD (gastroesophageal reflux disease)   . History of cardiac catheterization 11/06/2015   normal coronary arteries  . History of echocardiogram 11/06/2015   normal  . Hypertension   . MI (myocardial infarction) (HCC) 10/2015   elevated troponin, likely due to vasospasm, clean coronary arteries on cath  . Panic attack     Patient Active Problem List   Diagnosis Date Noted  . Tobacco use disorder 09/16/2016  . 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 Benzodiazepine withdrawal (HCC) 09/15/2016  . PTSD (post-traumatic stress disorder) 09/15/2016  . HTN (hypertension) 09/14/2016  . GERD (gastroesophageal reflux disease) 09/14/2016  . Suicidal ideation 09/14/2016  . Bipolar 2 disorder, major depressive episode (HCC) 09/13/2016  . Insomnia   . Anxiety   . 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  Panic disorder 01/04/2016  . NSTEMI (non-ST elevated myocardial infarction) (HCC) 11/06/2015  . Normal coronary arteries 11/06/15 11/06/2015    Past Surgical History:  Procedure Laterality Date  . CARDIAC CATHETERIZATION N/A 11/06/2015   Procedure: Left Heart Cath and Coronary Angiography;  Surgeon: Thurmon Fair, MD;  Location: [facility] INVASIVE CV LAB;  Service: Cardiovascular;  Laterality: N/A;  . CESAREAN SECTION    . NO PAST SURGERIES      OB History    Gravida Para Term Preterm AB Living             2   SAB TAB Ectopic Multiple Live Births                   Home Medications    Prior to Admission medications   Medication Sig Start Date End Date Taking? Authorizing Provider  albuterol (PROVENTIL) (2.5 MG/3ML) 0.083% nebulizer solution Inhale 3 mLs into the lungs every 6 (six) hours as needed for wheezing or shortness of breath. 09/18/16   Pucilowska, Jolanta B, MD  beclomethasone (QVAR) 80 MCG/ACT inhaler Inhale 2 puffs into the lungs 2 (two) times daily.    [provider]  FLUoxetine (PROZAC) 40 MG capsule Take 1 capsule (40 mg total) by mouth at bedtime. 09/18/16   Pucilowska, Braulio Conte B, MD  gabapentin (NEURONTIN) 100 MG capsule Take 2 capsules (200 mg total) by mouth 3 (three) times daily. 09/18/16   Pucilowska, Braulio Conte B, MD  hydrOXYzine (ATARAX/VISTARIL) 50 MG tablet Take 1 tablet (50 mg total) by mouth 3 (three) times daily as  needed for anxiety. 09/18/16   Pucilowska, Braulio Conte B, MD  metoprolol (LOPRESSOR) 50 MG tablet Take 1 tablet (50 mg total) by mouth 2 (two) times daily. 09/18/16   Pucilowska, Braulio Conte B, MD  metoprolol tartrate (LOPRESSOR) 25 MG tablet Take 1 tablet (25 mg total) by mouth 2 (two) times daily. 09/18/16   Pucilowska, Jolanta B, MD  pantoprazole (PROTONIX) 40 MG tablet Take 1 tablet (40 mg total) by mouth daily. 09/18/16   Pucilowska, Braulio Conte B, MD  traZODone (DESYREL) 100 MG tablet Take 2 tablets (200 mg total) by mouth at bedtime. 09/18/16   Pucilowska,  Braulio Conte B, MD  ziprasidone (GEODON) 80 MG capsule Take 1 capsule (80 mg total) by mouth 2 (two) times daily with a meal. 09/18/16   Pucilowska, Ellin Goodie, MD    Family History History reviewed. No pertinent family history.  Social History Social History  Substance Use Topics  . Smoking status: Current Every Day Smoker    Packs/day: 0.50    Years: 5.00    Types: Cigarettes  . Smokeless tobacco: Never Used  . Alcohol use No     Allergies   Ciprofloxacin; Penicillins; Toradol [ketorolac tromethamine]; Amoxicillin; Codeine; Metronidazole; Saphris [asenapine]; and Sulfa antibiotics   Review of Systems Review of Systems  Constitutional: Negative for diaphoresis and fever.  Respiratory: Negative for shortness of breath.   Cardiovascular: Negative for chest pain, palpitations and leg swelling.  Psychiatric/Behavioral: Positive for suicidal ideas. Negative for hallucinations and homicidal ideas.  All other systems reviewed and are negative.    Physical Exam Updated Vital Signs BP (!) 145/98   Pulse (!) 129   Temp 98.6 F (37 C) (Oral)   Resp 20   LMP 04/07/2017 (Exact Date)   SpO2 96%   Physical Exam  Constitutional: She is oriented to person, place, and time. She appears well-developed and well-nourished. No distress.  HENT:  Head: Normocephalic and atraumatic.  Mouth/Throat: No oropharyngeal exudate.  Eyes: Pupils are equal, round, and reactive to light. Conjunctivae and EOM are normal.  Neck: Normal range of motion. Neck supple.  Cardiovascular: Normal rate, regular rhythm, normal heart sounds and intact distal pulses.   Pulmonary/Chest: Effort normal and breath sounds normal.  Abdominal: Soft. Bowel sounds are normal. She exhibits no mass. There is no tenderness. There is no rebound and no guarding.  Musculoskeletal: Normal range of motion.  Neurological: She is alert and oriented to person, place, and time.  Skin: Skin is warm and dry. Capillary refill takes less  than 2 seconds.     ED Treatments / Results   Vitals:   04/08/17 0117 04/08/17 0235  BP: (!) 145/98 138/88  Pulse: (!) 129 97  Resp: 20 18  Temp: 98.6 F (37 C) 98.6 F (37 C)  SpO2: 96% 98%    Labs (all labs ordered are listed, but only abnormal results are displayed)  Results for orders placed or performed during the hospital encounter of 04/08/17  Comprehensive metabolic panel  Result Value Ref Range   Sodium 138 135 - 145 mmol/L   Potassium 3.3 (L) 3.5 - 5.1 mmol/L   Chloride 105 101 - 111 mmol/L   CO2 22 22 - 32 mmol/L   Glucose, Bld 115 (H) 65 - 99 mg/dL   BUN 15 6 - 20 mg/dL   Creatinine, Ser 1.61 0.44 - 1.00 mg/dL   Calcium 8.8 (L) 8.9 - 10.3 mg/dL   Total Protein 7.0 6.5 - 8.1 g/dL   Albumin 4.1 3.5 - 5.0  g/dL   AST 27 15 - 41 U/L   ALT 29 14 - 54 U/L   Alkaline Phosphatase 90 38 - 126 U/L   Total Bilirubin 0.3 0.3 - 1.2 mg/dL   GFR calc non Af Amer >60 >60 mL/min   GFR calc Af Amer >60 >60 mL/min   Anion gap 11 5 - 15  Ethanol  Result Value Ref Range   Alcohol, Ethyl (B) 22 (H) <5 mg/dL  Urine rapid drug screen (hosp performed)  Result Value Ref Range   Opiates NONE DETECTED NONE DETECTED   Cocaine NONE DETECTED NONE DETECTED   Benzodiazepines NONE DETECTED NONE DETECTED   Amphetamines NONE DETECTED NONE DETECTED   Tetrahydrocannabinol NONE DETECTED NONE DETECTED   Barbiturates NONE DETECTED NONE DETECTED  CBC with Diff  Result Value Ref Range   WBC 14.8 (H) 4.0 - 10.5 K/uL   RBC 4.93 3.87 - 5.11 MIL/uL   Hemoglobin 14.5 12.0 - 15.0 g/dL   HCT 12.2 44.9 - 75.3 %   MCV 89.7 78.0 - 100.0 fL   MCH 29.4 26.0 - 34.0 pg   MCHC 32.8 30.0 - 36.0 g/dL   RDW 00.5 (H) 11.0 - 21.1 %   Platelets 275 150 - 400 K/uL   Neutrophils Relative % 66 %   Neutro Abs 10.0 (H) 1.7 - 7.7 K/uL   Lymphocytes Relative 22 %   Lymphs Abs 3.2 0.7 - 4.0 K/uL   Monocytes Relative 9 %   Monocytes Absolute 1.3 (H) 0.1 - 1.0 K/uL   Eosinophils Relative 2 %   Eosinophils  Absolute 0.3 0.0 - 0.7 K/uL   Basophils Relative 1 %   Basophils Absolute 0.1 0.0 - 0.1 K/uL  Pregnancy, urine  Result Value Ref Range   Preg Test, Ur NEGATIVE NEGATIVE  Urinalysis, Routine w reflex microscopic  Result Value Ref Range   Color, Urine RED (A) YELLOW   APPearance CLOUDY (A) CLEAR   Specific Gravity, Urine 1.010 1.005 - 1.030   pH 6.0 5.0 - 8.0   Glucose, UA NEGATIVE NEGATIVE mg/dL   Hgb urine dipstick LARGE (A) NEGATIVE   Bilirubin Urine NEGATIVE NEGATIVE   Ketones, ur NEGATIVE NEGATIVE mg/dL   Protein, ur 173 (A) NEGATIVE mg/dL   Nitrite NEGATIVE NEGATIVE   Leukocytes, UA TRACE (A) NEGATIVE  Urinalysis, Microscopic (reflex)  Result Value Ref Range   RBC / HPF TOO NUMEROUS TO COUNT 0 - 5 RBC/hpf   WBC, UA 0-5 0 - 5 WBC/hpf   Bacteria, UA NONE SEEN NONE SEEN   Squamous Epithelial / LPF 0-5 (A) NONE SEEN  Acetaminophen level  Result Value Ref Range   Acetaminophen (Tylenol), Serum <10 (L) 10 - 30 ug/mL  Salicylate level  Result Value Ref Range   Salicylate Lvl <7.0 2.8 - 30.0 mg/dL   No results found.  EKG  EKG Interpretation  Date/Time:  Saturday April 08 2017 02:33:17 EDT Ventricular Rate:  97 PR Interval:  120 QRS Duration: 82 QT Interval:  382 QTC Calculation: 485 R Axis:   -63 Text Interpretation:  Normal sinus rhythm Left anterior fascicular block Confirmed by Paxtyn Boyar ([id number]) on 04/08/2017 3:55:39 AM        Procedures Procedures (including critical care time)  Medications Ordered in ED Medications  alum & mag hydroxide-simeth (MAALOX/MYLANTA) 200-200-20 MG/5ML suspension 30 mL (30 mLs Oral Given 04/08/17 0319)  nicotine (NICODERM CQ - dosed in mg/24 hours) patch 21 mg (21 mg Transdermal Patch Applied 04/08/17 0320)  Final Clinical Impressions(s) / ED Diagnoses  Depression: medically cleared for psychiatry who recommend inpatient care.  No beds at [facility].     [place], MD 04/08/17 [phone]
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
## 2017-04-08 NOTE — ED Notes (Signed)
Patient arrived via [facility] from [facility] placed in room 28.  Sitter at bedside

## 2017-04-08 NOTE — ED Notes (Signed)
Contacted [facility] for transport - ETA 20-25 minutes

## 2017-04-08 NOTE — Progress Notes (Signed)
Pt ambulatory to unit accompanied by TCU staff. Gait unsteady, pt appears sedated on arrival to unt "I just want to sleep".  Per nursing report, pt is a 41 y/o female who presents to [facility] with c/o of symptoms of depression, panic attacks, anxiety racing thoughts, poor sleep (2 hrs) in the past three days. Pt also reported h/o of cutting, last episode was on 04-07-17 (superficial lacerations on pt's left wrist). Emotional support offered on arrivals. Q 15 minutes safety checks maintained without self harm gestures noted. Will continue to monitor pt for safety and mood stability.

## 2017-04-08 NOTE — ED Notes (Signed)
Asumed care of patient from [place], [place]. Pt resting quietly. No distress. Awaiting TCU bed at [facility].

## 2017-04-08 NOTE — ED Notes (Signed)
Spoke with Scientist, physiological, charge nurse at [facility], and will accept her to TCU and ED MD to accept transfer

## 2017-04-08 NOTE — ED Provider Notes (Signed)
Sent from [facility] to await inpatient psych placement.  Vitals stable.  Medically cleared   Linwood Dibbles, MD 04/08/17 [phone]

## 2017-04-08 NOTE — ED Notes (Signed)
Pt A&O x 3, no distress noted, calm & cooperative.  Sleeping at present.  Monitoring for safety, Q 15 min checks in effect. 

## 2017-04-08 NOTE — ED Provider Notes (Signed)
I assumed care of this patient from Dr. Nicanor Alcon at 0700.  Please see their note for further details of Hx, PE.  Briefly patient is a 41 y.o. female who presents with SI with plan to cut her wrist. Current plan is to await placement or transfer to [facility].  7:48 AM Home medication started.  7:50 AM Accepted at [facility] to await placement by Dr. Lynelle Doctor.   Nira Conn, MD 04/08/17 [phone]

## 2017-04-09 DIAGNOSIS — F419 Anxiety disorder, unspecified: Secondary | ICD-10-CM

## 2017-04-09 DIAGNOSIS — R45851 Suicidal ideations: Secondary | ICD-10-CM | POA: Diagnosis not present

## 2017-04-09 DIAGNOSIS — F17210 Nicotine dependence, cigarettes, uncomplicated: Secondary | ICD-10-CM

## 2017-04-09 DIAGNOSIS — F319 Bipolar disorder, unspecified: Secondary | ICD-10-CM

## 2017-04-09 DIAGNOSIS — F29 Unspecified psychosis not due to a substance or known physiological condition: Secondary | ICD-10-CM

## 2017-04-09 NOTE — Discharge Instructions (Signed)
For your ongoing mental health needs, you are advised to follow up with [facility]. Hours of Operation: Monday - Friday 8AM - 5PM [facility] [address] [place], [place] [address]  [phone]

## 2017-04-09 NOTE — BHH Suicide Risk Assessment (Signed)
Suicide Risk Assessment  [facility]   [facility] Discharge Suicide Risk Assessment   Principal Problem: Bipolar disorder with psychotic features [facility]) Discharge Diagnoses:  Patient Active Problem List   Diagnosis Date Noted  . Bipolar disorder with psychotic features (HCC) [F31.9] 04/08/2017  . Tobacco use disorder [F17.200] 09/16/2016  . Sedative, hypnotic or anxiolytic use disorder, severe, dependence (HCC) [F13.20] 09/15/2016  . Benzodiazepine withdrawal (HCC) [F13.239] 09/15/2016  . PTSD (post-traumatic stress disorder) [F43.10] 09/15/2016  . HTN (hypertension) [I10] 09/14/2016  . GERD (gastroesophageal reflux disease) [K21.9] 09/14/2016  . Suicidal ideation [R45.851] 09/14/2016  . Bipolar 2 disorder, major depressive episode (HCC) [F31.81] 09/13/2016  . Insomnia [G47.00]   . Anxiety [F41.9]   . 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 Panic disorder [F41.0] 01/04/2016  . NSTEMI (non-ST elevated myocardial infarction) (HCC) [I21.4] 11/06/2015  . Normal coronary arteries 11/06/15 [Z03.89] 11/06/2015    Total Time spent with patient: 45 minutes  Musculoskeletal: Strength & Muscle Tone: within normal limits Gait & Station: normal Patient leans: N/A  Psychiatric Specialty Exam: Physical Exam  Constitutional: She is oriented to person, place, and time. She appears well-developed and well-nourished.  HENT:  Head: Normocephalic.  Respiratory: Effort normal.  Musculoskeletal: Normal range of motion.  Neurological: She is alert and oriented to person, place, and time.  Psychiatric: Her speech is normal and behavior is normal. Thought content normal. Her mood appears anxious. Cognition and memory are normal. She expresses impulsivity. She exhibits a depressed mood.   Review of Systems  Psychiatric/Behavioral: Positive for depression, substance abuse and suicidal ideas (passive). Negative for hallucinations and memory loss. The patient is nervous/anxious. The patient does  not have insomnia.   All other systems reviewed and are negative.  Blood pressure 113/67, pulse 72, temperature 98.6 F (37 C), temperature source Oral, resp. rate 17, last menstrual period 04/07/2017, SpO2 100 %.There is no height or weight on file to calculate BMI. General Appearance: Casual Eye Contact:  Good Speech:  Clear and Coherent and Normal Rate Volume:  Normal Mood:  Anxious and Depressed Affect:  Congruent and Depressed Thought Process:  Coherent, Goal Directed and Linear Orientation:  Full (Time, Place, and Person) Thought Content:  Logical Suicidal Thoughts:  No Homicidal Thoughts:  No Memory:  Immediate;   Good Recent;   Good Remote;   Fair Judgement:  Fair Insight:  Fair Psychomotor Activity:  Normal Concentration:  Concentration: Good and Attention Span: Good Recall:  Good Fund of Knowledge:  Good Language:  Good Akathisia:  No Handed:  Right AIMS (if indicated):    Assets:  Architect Housing Resilience Social Support ADL's:  Intact Cognition:  WNL  Mental Status Per Nursing Assessment::   On Admission:   anxious, depressed with suicidal ideation  Demographic Factors:  Caucasian, Low socioeconomic status and Unemployed  Loss Factors: Financial problems/change in socioeconomic status  Historical Factors: Impulsivity  Risk Reduction Factors:   Sense of responsibility to family and Living with another person, especially a relative  Continued Clinical Symptoms:  Severe Anxiety and/or Agitation Bipolar Disorder:   Depressive phase Alcohol/Substance Abuse/Dependencies More than one psychiatric diagnosis Previous Psychiatric Diagnoses and Treatments  Cognitive Features That Contribute To Risk:  Closed-mindedness    Suicide Risk:  Minimal: No identifiable suicidal ideation.  Patients presenting with no risk factors but with morbid ruminations; may be classified as minimal risk based on the severity of the  depressive symptoms    Plan Of Care/Follow-up recommendations:  Activity:  as  tolerated Diet:  Heart Healthy  Laveda Abbe, NP 04/09/2017, 1:30 PM

## 2017-04-09 NOTE — ED Notes (Signed)
Patient discharged home per MD order.  Patient will follow up with [facility] in [facility]  Patient states she will attempt to call her friend to pick her up.  She has passive SI with no specific plan.  Patient appeared to be med seeking and expressed her concern over discharge.  She felt "rushed."  Explained to patient that she needed to call her friend.  She contracted for safety while in the [facility].  She did not appear to be responding to internal stimuli.  Patient is basically homeless due to boyfriend kicking her out of residence.  Patient left ambulatory with all her belongings.

## 2017-04-09 NOTE — Consult Note (Signed)
[place] [facility] Consult   Reason for Consult: Suicidal Ideation with depression and anxiety Referring Physician:  EDP Patient Identification: Melissa Butler MRN:  [id number] Principal Diagnosis: Bipolar disorder with psychotic features [facility]) Diagnosis:   Patient Active Problem List   Diagnosis Date Noted  . Bipolar disorder with psychotic features ([place]) [F31.9] 04/08/2017  . Tobacco use disorder [F17.200] 09/16/2016  . Sedative, hypnotic or anxiolytic use disorder, severe, dependence ([place]) [F13.20] 09/15/2016  . Benzodiazepine withdrawal ([place]) [F13.239] 09/15/2016  . PTSD (post-traumatic stress disorder) [F43.10] 09/15/2016  . HTN (hypertension) [I10] 09/14/2016  . GERD (gastroesophageal reflux disease) [K21.9] 09/14/2016  . Suicidal ideation [R45.851] 09/14/2016  . Bipolar 2 disorder, major depressive episode ([place]) [F31.81] 09/13/2016  . Insomnia [G47.00]   . Anxiety [F41.9]   . 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 Panic disorder [F41.0] 01/04/2016  . NSTEMI (non-ST elevated myocardial infarction) ([place]) [I21.4] 11/06/2015  . Normal coronary arteries 11/06/15 [Z03.89] 11/06/2015    Total Time spent with patient: 45 minutes  Subjective:   Melissa Butler is a 41 y.o. female patient admitted with depression and anxiety.  HPI:  Pt was seen and chart reviewed with treatment team and Dr Darleene Cleaver. Pt presented tot the [facility] with worsening depression and anxiety and suicidal ideations without a plan. Pt has been off her psychiatric medications for ~3 months and stated she had no way to get them refilled after her last hospitalization. Pt stated she has been living with a friend and has transportation to outpatient resources for therapy and medication management.  Pt denies suicidal/homicidal ideation, denies auditory/visual hallucinations and does not appear to be responding to internal stimuli. Pt is psychiatrically clear for discharge.   Past Psychiatric History: As  above  Risk to Self: None Risk to Others: None Prior Inpatient Therapy: Prior Inpatient Therapy: Yes Prior Therapy Dates: 08/2016; 12/2015; 10/2015 Prior Therapy Facilty/Provider(s): [facility]; [facility] Reason for Treatment: MDD Prior Outpatient Therapy: Prior Outpatient Therapy: Yes Prior Therapy Dates: 2017 Prior Therapy Facilty/Provider(s): [facility] Reason for Treatment: Depression Does patient have an ACCT team?: No Does patient have Intensive In-House Services?  : No Does patient have [facility] services? : No Does patient have P4CC services?: No  Past Medical History:  Past Medical History:  Diagnosis Date  . Anxiety   . Chronic abdominal pain   . Chronic pelvic pain in female   . Depression   . GERD (gastroesophageal reflux disease)   . History of cardiac catheterization 11/06/2015   normal coronary arteries  . History of echocardiogram 11/06/2015   normal  . Hypertension   . MI (myocardial infarction) ([place]) 10/2015   elevated troponin, likely due to vasospasm, clean coronary arteries on cath  . Panic attack     Past Surgical History:  Procedure Laterality Date  . CARDIAC CATHETERIZATION N/A 11/06/2015   Procedure: Left Heart Cath and Coronary Angiography;  Surgeon: Sanda Klein, MD;  Location: [place] [facility];  Service: Cardiovascular;  Laterality: N/A;  . CESAREAN SECTION    . NO PAST SURGERIES     Family History: History reviewed. No pertinent family history. Family Psychiatric  History: Unknown Social History:  History  Alcohol Use No     History  Drug Use No    Social History   Social History  . Marital status: Single    Spouse name: N/A  . Number of children: N/A  . Years of education: N/A   Social History Main Topics  . Smoking status: Current Every Day  Smoker    Packs/day: 0.50    Years: 5.00    Types: Cigarettes  . Smokeless tobacco: Never Used  . Alcohol use No  . Drug use: No  . Sexual activity: Yes   Other Topics Concern  .  None   Social History Narrative  . None   Additional Social History:    Allergies:   Allergies  Allergen Reactions  . Ciprofloxacin Anaphylaxis, Hives and Rash  . Penicillins Anaphylaxis, Hives, Shortness Of Breath, Swelling and Rash    Has patient had a PCN reaction causing immediate rash, facial/tongue/throat swelling, SOB or lightheadedness with hypotension: Yes Has patient had a PCN reaction causing severe rash involving mucus membranes or skin necrosis: No Has patient had a PCN reaction that required hospitalization No Has patient had a PCN reaction occurring within the last 10 years: No If all of the above answers are "NO", then may proceed with Cephalosporin use.   . Toradol [Ketorolac Tromethamine] Anaphylaxis  . Amoxicillin   . Codeine Nausea And Vomiting  . Metronidazole     Other reaction(s): Other - See Comments "All jittery and burning"  . 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 Sulfa Antibiotics     Labs:  Results for orders placed or performed during the hospital encounter of 04/08/17 (from the past 48 hour(s))  Comprehensive metabolic panel     Status: Abnormal   Collection Time: 04/08/17  1:40 AM  Result Value Ref Range   Sodium 138 135 - 145 mmol/L   Potassium 3.3 (L) 3.5 - 5.1 mmol/L   Chloride 105 101 - 111 mmol/L   CO2 22 22 - 32 mmol/L   Glucose, Bld 115 (H) 65 - 99 mg/dL   BUN 15 6 - 20 mg/dL   Creatinine, Ser 0.77 0.44 - 1.00 mg/dL   Calcium 8.8 (L) 8.9 - 10.3 mg/dL   Total Protein 7.0 6.5 - 8.1 g/dL   Albumin 4.1 3.5 - 5.0 g/dL   AST 27 15 - 41 U/L   ALT 29 14 - 54 U/L   Alkaline Phosphatase 90 38 - 126 U/L   Total Bilirubin 0.3 0.3 - 1.2 mg/dL   GFR calc non Af Amer >60 >60 mL/min   GFR calc Af Amer >60 >60 mL/min    Comment: (NOTE) The eGFR has been calculated using the CKD EPI equation. This calculation has not been validated in all clinical situations. eGFR's persistently <60 mL/min signify possible  Chronic Kidney Disease.    Anion gap 11 5 - 15  Ethanol     Status: Abnormal   Collection Time: 04/08/17  1:40 AM  Result Value Ref Range   Alcohol, Ethyl (B) 22 (H) <5 mg/dL    Comment:        LOWEST DETECTABLE LIMIT FOR SERUM ALCOHOL IS 5 mg/dL FOR MEDICAL PURPOSES ONLY   CBC with Diff     Status: Abnormal   Collection Time: 04/08/17  1:40 AM  Result Value Ref Range   WBC 14.8 (H) 4.0 - 10.5 K/uL   RBC 4.93 3.87 - 5.11 MIL/uL   Hemoglobin 14.5 12.0 - 15.0 g/dL   HCT 44.2 36.0 - 46.0 %   MCV 89.7 78.0 - 100.0 fL   MCH 29.4 26.0 - 34.0 pg   MCHC 32.8 30.0 - 36.0 g/dL   RDW 16.3 (H) 11.5 - 15.5 %   Platelets 275 150 - 400 K/uL   Neutrophils Relative % 66 %  Neutro Abs 10.0 (H) 1.7 - 7.7 K/uL   Lymphocytes Relative 22 %   Lymphs Abs 3.2 0.7 - 4.0 K/uL   Monocytes Relative 9 %   Monocytes Absolute 1.3 (H) 0.1 - 1.0 K/uL   Eosinophils Relative 2 %   Eosinophils Absolute 0.3 0.0 - 0.7 K/uL   Basophils Relative 1 %   Basophils Absolute 0.1 0.0 - 0.1 K/uL  Acetaminophen level     Status: Abnormal   Collection Time: 04/08/17  1:40 AM  Result Value Ref Range   Acetaminophen (Tylenol), Serum <10 (L) 10 - 30 ug/mL    Comment:        THERAPEUTIC CONCENTRATIONS VARY SIGNIFICANTLY. A RANGE OF 10-30 ug/mL MAY BE AN EFFECTIVE CONCENTRATION FOR MANY PATIENTS. HOWEVER, SOME ARE BEST TREATED AT CONCENTRATIONS OUTSIDE THIS RANGE. ACETAMINOPHEN CONCENTRATIONS >150 ug/mL AT 4 HOURS AFTER INGESTION AND >50 ug/mL AT 12 HOURS AFTER INGESTION ARE OFTEN ASSOCIATED WITH TOXIC REACTIONS.   Salicylate level     Status: None   Collection Time: 04/08/17  1:40 AM  Result Value Ref Range   Salicylate Lvl <4.7 2.8 - 30.0 mg/dL  Urine rapid drug screen (hosp performed)     Status: None   Collection Time: 04/08/17  1:41 AM  Result Value Ref Range   Opiates NONE DETECTED NONE DETECTED   Cocaine NONE DETECTED NONE DETECTED   Benzodiazepines NONE DETECTED NONE DETECTED   Amphetamines NONE  DETECTED NONE DETECTED   Tetrahydrocannabinol NONE DETECTED NONE DETECTED   Barbiturates NONE DETECTED NONE DETECTED    Comment:        DRUG SCREEN FOR MEDICAL PURPOSES ONLY.  IF CONFIRMATION IS NEEDED FOR ANY PURPOSE, NOTIFY LAB WITHIN 5 DAYS.        LOWEST DETECTABLE LIMITS FOR URINE DRUG SCREEN Drug Class       Cutoff (ng/mL) Amphetamine      1000 Barbiturate      200 Benzodiazepine   654 Tricyclics       650 Opiates          300 Cocaine          300 THC              50   Pregnancy, urine     Status: None   Collection Time: 04/08/17  1:41 AM  Result Value Ref Range   Preg Test, Ur NEGATIVE NEGATIVE    Comment:        THE SENSITIVITY OF THIS METHODOLOGY IS >20 mIU/mL.   Urinalysis, Routine w reflex microscopic     Status: Abnormal   Collection Time: 04/08/17  1:41 AM  Result Value Ref Range   Color, Urine RED (A) YELLOW    Comment: BIOCHEMICALS MAY BE AFFECTED BY COLOR   APPearance CLOUDY (A) CLEAR   Specific Gravity, Urine 1.010 1.005 - 1.030   pH 6.0 5.0 - 8.0   Glucose, UA NEGATIVE NEGATIVE mg/dL   Hgb urine dipstick LARGE (A) NEGATIVE   Bilirubin Urine NEGATIVE NEGATIVE   Ketones, ur NEGATIVE NEGATIVE mg/dL   Protein, ur 100 (A) NEGATIVE mg/dL   Nitrite NEGATIVE NEGATIVE   Leukocytes, UA TRACE (A) NEGATIVE  Urinalysis, Microscopic (reflex)     Status: Abnormal   Collection Time: 04/08/17  1:41 AM  Result Value Ref Range   RBC / HPF TOO NUMEROUS TO COUNT 0 - 5 RBC/hpf   WBC, UA 0-5 0 - 5 WBC/hpf   Bacteria, UA NONE SEEN NONE SEEN   Squamous Epithelial /  LPF 0-5 (A) NONE SEEN    Current Facility-Administered Medications  Medication Dose Route Frequency Provider Last Rate Last Dose  . acetaminophen (TYLENOL) tablet 650 mg  650 mg Oral Q6H PRN Dorie Rank, MD      . albuterol (PROVENTIL) (2.5 MG/3ML) 0.083% nebulizer solution 3 mL  3 mL Inhalation Q6H PRN Cardama, Grayce Sessions, MD      . alum & mag hydroxide-simeth (MAALOX/MYLANTA) 200-200-20 MG/5ML  suspension 30 mL  30 mL Oral Q6H PRN Palumbo, April, MD   30 mL at 04/08/17 0319  . beclomethasone (QVAR) 40 MCG/ACT inhaler 4 puff  4 puff Inhalation BID Cardama, Grayce Sessions, MD   4 puff at 04/09/17 1042  . FLUoxetine (PROZAC) capsule 20 mg  20 mg Oral QHS Craig Ionescu, MD   20 mg at 04/08/17 2124  . gabapentin (NEURONTIN) capsule 200 mg  200 mg Oral TID Fatima Blank, MD   200 mg at 04/09/17 0934  . hydrOXYzine (ATARAX/VISTARIL) tablet 25 mg  25 mg Oral TID PRN Corena Pilgrim, MD   25 mg at 04/08/17 2124  . metoprolol tartrate (LOPRESSOR) tablet 50 mg  50 mg Oral BID Fatima Blank, MD   50 mg at 04/09/17 0932  . nicotine (NICODERM CQ - dosed in mg/24 hours) patch 21 mg  21 mg Transdermal Daily Palumbo, April, MD   21 mg at 04/08/17 1047  . pantoprazole (PROTONIX) EC tablet 40 mg  40 mg Oral Daily Cardama, Grayce Sessions, MD   40 mg at 04/09/17 0934  . ziprasidone (GEODON) capsule 40 mg  40 mg Oral QHS Corena Pilgrim, MD       Current Outpatient Prescriptions  Medication Sig Dispense Refill  . ibuprofen (ADVIL,MOTRIN) 200 MG tablet Take 400 mg by mouth every 6 (six) hours as needed.    . Ibuprofen-Diphenhydramine HCl (ADVIL PM) 200-25 MG CAPS Take 2 capsules by mouth at bedtime as needed.    Marland Kitchen albuterol (PROVENTIL) (2.5 MG/3ML) 0.083% nebulizer solution Inhale 3 mLs into the lungs every 6 (six) hours as needed for wheezing or shortness of breath. (Patient not taking: Reported on 04/08/2017) 75 mL 12  . beclomethasone (QVAR) 80 MCG/ACT inhaler Inhale 2 puffs into the lungs 2 (two) times daily.    Marland Kitchen FLUoxetine (PROZAC) 40 MG capsule Take 1 capsule (40 mg total) by mouth at bedtime. (Patient not taking: Reported on 04/08/2017) 30 capsule 1  . gabapentin (NEURONTIN) 100 MG capsule Take 2 capsules (200 mg total) by mouth 3 (three) times daily. (Patient not taking: Reported on 04/08/2017) 90 capsule 1  . hydrOXYzine (ATARAX/VISTARIL) 50 MG tablet Take 1 tablet (50 mg total) by  mouth 3 (three) times daily as needed for anxiety. (Patient not taking: Reported on 04/08/2017) 90 tablet 1  . metoprolol (LOPRESSOR) 50 MG tablet Take 1 tablet (50 mg total) by mouth 2 (two) times daily. (Patient not taking: Reported on 04/08/2017) 60 tablet 1  . metoprolol tartrate (LOPRESSOR) 25 MG tablet Take 1 tablet (25 mg total) by mouth 2 (two) times daily. 60 tablet 1  . pantoprazole (PROTONIX) 40 MG tablet Take 1 tablet (40 mg total) by mouth daily. (Patient not taking: Reported on 04/08/2017) 30 tablet 1  . traZODone (DESYREL) 100 MG tablet Take 2 tablets (200 mg total) by mouth at bedtime. 60 tablet 1  . ziprasidone (GEODON) 80 MG capsule Take 1 capsule (80 mg total) by mouth 2 (two) times daily with a meal. (Patient not taking: Reported on 04/08/2017) 60  capsule 1    Musculoskeletal: Strength & Muscle Tone: within normal limits Gait & Station: normal Patient leans: N/A  Psychiatric Specialty Exam: Physical Exam  Constitutional: She is oriented to person, place, and time. She appears well-developed and well-nourished.  HENT:  Head: Normocephalic.  Respiratory: Effort normal.  Musculoskeletal: Normal range of motion.  Neurological: She is alert and oriented to person, place, and time.  Psychiatric: Her speech is normal and behavior is normal. Thought content normal. Her mood appears anxious. Cognition and memory are normal. She expresses impulsivity. She exhibits a depressed mood.    Review of Systems  Psychiatric/Behavioral: Positive for depression, substance abuse and suicidal ideas (passive). Negative for hallucinations and memory loss. The patient is nervous/anxious. The patient does not have insomnia.   All other systems reviewed and are negative.   Blood pressure 113/67, pulse 72, temperature 98.6 F (37 C), temperature source Oral, resp. rate 17, last menstrual period 04/07/2017, SpO2 100 %.There is no height or weight on file to calculate BMI.  General Appearance: Casual   Eye Contact:  Good  Speech:  Clear and Coherent and Normal Rate  Volume:  Normal  Mood:  Anxious and Depressed  Affect:  Congruent and Depressed  Thought Process:  Coherent, Goal Directed and Linear  Orientation:  Full (Time, Place, and Person)  Thought Content:  Logical  Suicidal Thoughts:  No  Homicidal Thoughts:  No  Memory:  Immediate;   Good Recent;   Good Remote;   Fair  Judgement:  Fair  Insight:  Fair  Psychomotor Activity:  Normal  Concentration:  Concentration: Good and Attention Span: Good  Recall:  Good  Fund of Knowledge:  Good  Language:  Good  Akathisia:  No  Handed:  Right  AIMS (if indicated):     Assets:  Agricultural consultant Housing Resilience Social Support  ADL's:  Intact  Cognition:  WNL  Sleep:        Treatment Plan Summary: Plan Bipolar affective disorder with psychotic features  Discharge Home Follow up with [facility] in [place] Take all medications as prescribed Avoid the use of alcohol and illicit drugs  Disposition: No evidence of imminent risk to self or others at present.   Patient does not meet criteria for psychiatric inpatient admission. Supportive therapy provided about ongoing stressors. Discussed crisis plan, support from social network, calling 911, coming to the Emergency Department, and calling Suicide Hotline.  Ethelene Hal, NP 04/09/2017 1:01 PM  Patient seen face-to-face for psychiatric evaluation, chart reviewed and case discussed with the physician extender and developed treatment plan. Reviewed the information documented and agree with the treatment plan. Corena Pilgrim, MD

## 2017-04-09 NOTE — ED Notes (Signed)
Patient states, "I not feeling well.  I'm having anxiety and I ran out of my medications.  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

## 2017-09-11 ENCOUNTER — Emergency Department (HOSPITAL_BASED_OUTPATIENT_CLINIC_OR_DEPARTMENT_OTHER): Payer: Self-pay

## 2017-09-11 ENCOUNTER — Emergency Department (HOSPITAL_BASED_OUTPATIENT_CLINIC_OR_DEPARTMENT_OTHER)
Admission: EM | Admit: 2017-09-11 | Discharge: 2017-09-11 | Disposition: A | Discharge status: Home / Self Care | Payer: Self-pay | Attending: Physician Assistant | Admitting: Physician Assistant

## 2017-09-11 ENCOUNTER — Other Ambulatory Visit: Payer: Self-pay

## 2017-09-11 ENCOUNTER — Encounter (HOSPITAL_BASED_OUTPATIENT_CLINIC_OR_DEPARTMENT_OTHER): Payer: Self-pay | Admitting: Emergency Medicine

## 2017-09-11 DIAGNOSIS — F17210 Nicotine dependence, cigarettes, uncomplicated: Secondary | ICD-10-CM | POA: Insufficient documentation

## 2017-09-11 DIAGNOSIS — I10 Essential (primary) hypertension: Secondary | ICD-10-CM | POA: Insufficient documentation

## 2017-09-11 DIAGNOSIS — R05 Cough: Secondary | ICD-10-CM | POA: Insufficient documentation

## 2017-09-11 DIAGNOSIS — I252 Old myocardial infarction: Secondary | ICD-10-CM | POA: Insufficient documentation

## 2017-09-11 DIAGNOSIS — K047 Periapical abscess without sinus: Secondary | ICD-10-CM | POA: Insufficient documentation

## 2017-09-11 DIAGNOSIS — Z79899 Other long term (current) drug therapy: Secondary | ICD-10-CM | POA: Insufficient documentation

## 2017-09-11 MED ORDER — CLINDAMYCIN HCL 150 MG PO CAPS: 300.0000 mg | ORAL_CAPSULE | Freq: Four times a day (QID) | ORAL | 0 refills | Status: AC

## 2017-09-11 MED FILL — CLINDAMYCIN HCL 150 MG CAPS: 150 | 10 days supply | Qty: 80 | Fill #0

## 2017-09-11 NOTE — ED Triage Notes (Signed)
Pt c/o pain to LT upper gums; reports prod cough w/ green sputum and sore throat

## 2017-09-11 NOTE — ED Provider Notes (Signed)
[facility] EMERGENCY DEPARTMENT Provider Note   CSN: [id number] Arrival date & time: 09/11/17  1037     History   Chief Complaint Chief Complaint  Patient presents with  . Dental Pain  . Cough  . Sore Throat    HPI Melissa Butler is a 42 y.o. female.  HPI   Is a 42 year old female presenting with dental infection that is chronic.  Patient complaining of pain in the left upper jaw.  Patient reports that she is seeing a dentist but they are having trouble with her insurance.  This appears chronic in nature.  She also reports cough.  She said she is worried that the infection is gotten into her bloodstream.  She has no fevers nausea vomiting  Past Medical History:  Diagnosis Date  . Anxiety   . Chronic abdominal pain   . Chronic pelvic pain in female   . Depression   . GERD (gastroesophageal reflux disease)   . History of cardiac catheterization 11/06/2015   normal coronary arteries  . History of echocardiogram 11/06/2015   normal  . Hypertension   . MI (myocardial infarction) ([facility]) 10/2015   elevated troponin, likely due to vasospasm, clean coronary arteries on cath  . Panic attack     Patient Active Problem List   Diagnosis Date Noted  . Bipolar disorder with psychotic features ([facility]) 04/08/2017  . Tobacco use disorder 09/16/2016  . 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 Benzodiazepine withdrawal ([facility]) 09/15/2016  . PTSD (post-traumatic stress disorder) 09/15/2016  . HTN (hypertension) 09/14/2016  . GERD (gastroesophageal reflux disease) 09/14/2016  . Suicidal ideation 09/14/2016  . Bipolar 2 disorder, major depressive episode ([facility]) 09/13/2016  . Insomnia   . Anxiety   . 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 Panic disorder 01/04/2016  . NSTEMI (non-ST elevated myocardial infarction) ([facility]) 11/06/2015  . Normal coronary arteries 11/06/15 11/06/2015    Past Surgical History:  Procedure Laterality Date  .  CARDIAC CATHETERIZATION N/A 11/06/2015   Procedure: Left Heart Cath and Coronary Angiography;  Surgeon: Thurmon Fair, MD;  Location: [facility];  Service: Cardiovascular;  Laterality: N/A;  . CESAREAN SECTION    . NO PAST SURGERIES      OB History    Gravida Para Term Preterm AB Living             2   SAB TAB Ectopic Multiple Live Births                   Home Medications    Prior to Admission medications   Medication Sig Start Date End Date Taking? Authorizing Provider  albuterol (PROVENTIL) (2.5 MG/3ML) 0.083% nebulizer solution Inhale 3 mLs into the lungs every 6 (six) hours as needed for wheezing or shortness of breath. Patient not taking: Reported on 04/08/2017 09/18/16   Pucilowska, Ellin Goodie, MD  beclomethasone (QVAR) 80 MCG/ACT inhaler Inhale 2 puffs into the lungs 2 (two) times daily.    [provider]  FLUoxetine (PROZAC) 40 MG capsule Take 1 capsule (40 mg total) by mouth at bedtime. Patient not taking: Reported on 04/08/2017 09/18/16   Pucilowska, Ellin Goodie, MD  gabapentin (NEURONTIN) 100 MG capsule Take 2 capsules (200 mg total) by mouth 3 (three) times daily. Patient not taking: Reported on 04/08/2017 09/18/16   Shari Prows, MD  hydrOXYzine (ATARAX/VISTARIL) 50 MG tablet Take 1 tablet (50 mg total) by mouth 3 (three) times daily as needed for  anxiety. Patient not taking: Reported on 04/08/2017 09/18/16   Pucilowska, Braulio Conte B, MD  ibuprofen (ADVIL,MOTRIN) 200 MG tablet Take 400 mg by mouth every 6 (six) hours as needed.    [provider]  Ibuprofen-Diphenhydramine HCl (ADVIL PM) 200-25 MG CAPS Take 2 capsules by mouth at bedtime as needed.    [provider]  metoprolol (LOPRESSOR) 50 MG tablet Take 1 tablet (50 mg total) by mouth 2 (two) times daily. Patient not taking: Reported on 04/08/2017 09/18/16   Kristine Linea B, MD  metoprolol tartrate (LOPRESSOR) 25 MG tablet Take 1 tablet (25 mg total) by mouth 2 (two) times daily.  09/18/16   Pucilowska, Jolanta B, MD  pantoprazole (PROTONIX) 40 MG tablet Take 1 tablet (40 mg total) by mouth daily. Patient not taking: Reported on 04/08/2017 09/18/16   Shari Prows, MD  traZODone (DESYREL) 100 MG tablet Take 2 tablets (200 mg total) by mouth at bedtime. 09/18/16   Pucilowska, Braulio Conte B, MD  ziprasidone (GEODON) 80 MG capsule Take 1 capsule (80 mg total) by mouth 2 (two) times daily with a meal. Patient not taking: Reported on 04/08/2017 09/18/16   Pucilowska, Ellin Goodie, MD  dicyclomine (BENTYL) 20 MG tablet Take 1 tablet (20 mg total) by mouth every 6 (six) hours as needed for spasms (abdominal cramping). Patient not taking: Reported on 12/19/2015 12/18/15 12/21/15  Samuel Jester, DO    Family History No family history on file.  Social History Social History   Tobacco Use  . Smoking status: Current Every Day Smoker    Packs/day: 0.50    Years: 5.00    Pack years: 2.50    Types: Cigarettes  . Smokeless tobacco: Never Used  Substance Use Topics  . Alcohol use: No  . Drug use: No     Allergies   Ciprofloxacin; Penicillins; Toradol [ketorolac tromethamine]; Amoxicillin; Codeine; Metronidazole; Saphris [asenapine]; and Sulfa antibiotics   Review of Systems Review of Systems  Constitutional: Positive for fatigue.  HENT: Positive for facial swelling.   All other systems reviewed and are negative.    Physical Exam Updated Vital Signs BP 139/83 (BP Location: Right Arm)   Pulse 80   Temp 98.4 F (36.9 C) (Oral)   Resp 18   Ht 5\' 3"  (1.6 m)   Wt 86.6 kg (190 lb 14.7 oz)   LMP 08/28/2017   SpO2 98%   BMI 33.82 kg/m   Physical Exam  Constitutional: She is oriented to person, place, and time. She appears well-developed and well-nourished.  HENT:  Head: Normocephalic and atraumatic.  Left upper jaw with grossly malformed, around chronically infected tooth.  No evidence of active infection no area of fluctuance.  Eyes: EOM are normal. Pupils are  equal, round, and reactive to light. Right eye exhibits no discharge.  Cardiovascular: Normal rate.  Pulmonary/Chest: Effort normal.  Neurological: She is oriented to person, place, and time.  Skin: Skin is warm and dry. She is not diaphoretic.  Psychiatric: She has a normal mood and affect.  Nursing note and vitals reviewed.    ED Treatments / Results  Labs (all labs ordered are listed, but only abnormal results are displayed) Labs Reviewed - No data to display  EKG  EKG Interpretation None       Radiology Dg Chest 2 View  Result Date: 09/11/2017 CLINICAL DATA:  Productive cough. EXAM: CHEST  2 VIEW COMPARISON:  Radiograph of September 12, 2016. FINDINGS: The heart size and mediastinal contours are within normal limits.  Both lungs are clear. No pneumothorax or pleural effusion is noted. The visualized skeletal structures are unremarkable. IMPRESSION: No active cardiopulmonary disease. Electronically Signed   By: Lupita Raider, M.D.   On: 09/11/2017 12:57    Procedures Procedures (including critical care time)  Medications Ordered in ED Medications - No data to display   Initial Impression / Assessment and Plan / ED Course  I have reviewed the triage vital signs and the nursing notes.  Pertinent labs & imaging results that were available during my care of the patient were reviewed by me and considered in my medical decision making (see chart for details).     Patient with chronically infected tooth.  No signs of systemic toxicity.  Or of any active infection currently.  We will give her clindamycin since she is allergic to penicillin.  Encouraged her to continue to follow-up with this oral surgeon/dentist that she has already in touch with.  Final Clinical Impressions(s) / ED Diagnoses   Final diagnoses:  None    ED Discharge Orders    None       Jasline Buskirk, Cindee Salt, MD 09/11/17 1322

## 2017-09-11 NOTE — ED Notes (Signed)
Patient transported to X-ray 

## 2017-09-30 IMAGING — DX DG CHEST 2V
2 series · 2 of 2 positions shown · non-contrast
Comparison: 11/11/2015

CLINICAL DATA: Myocardial infarction last week with continued chest
pain shortness of breath

EXAM:
CHEST  2 VIEW

[w chest pa]
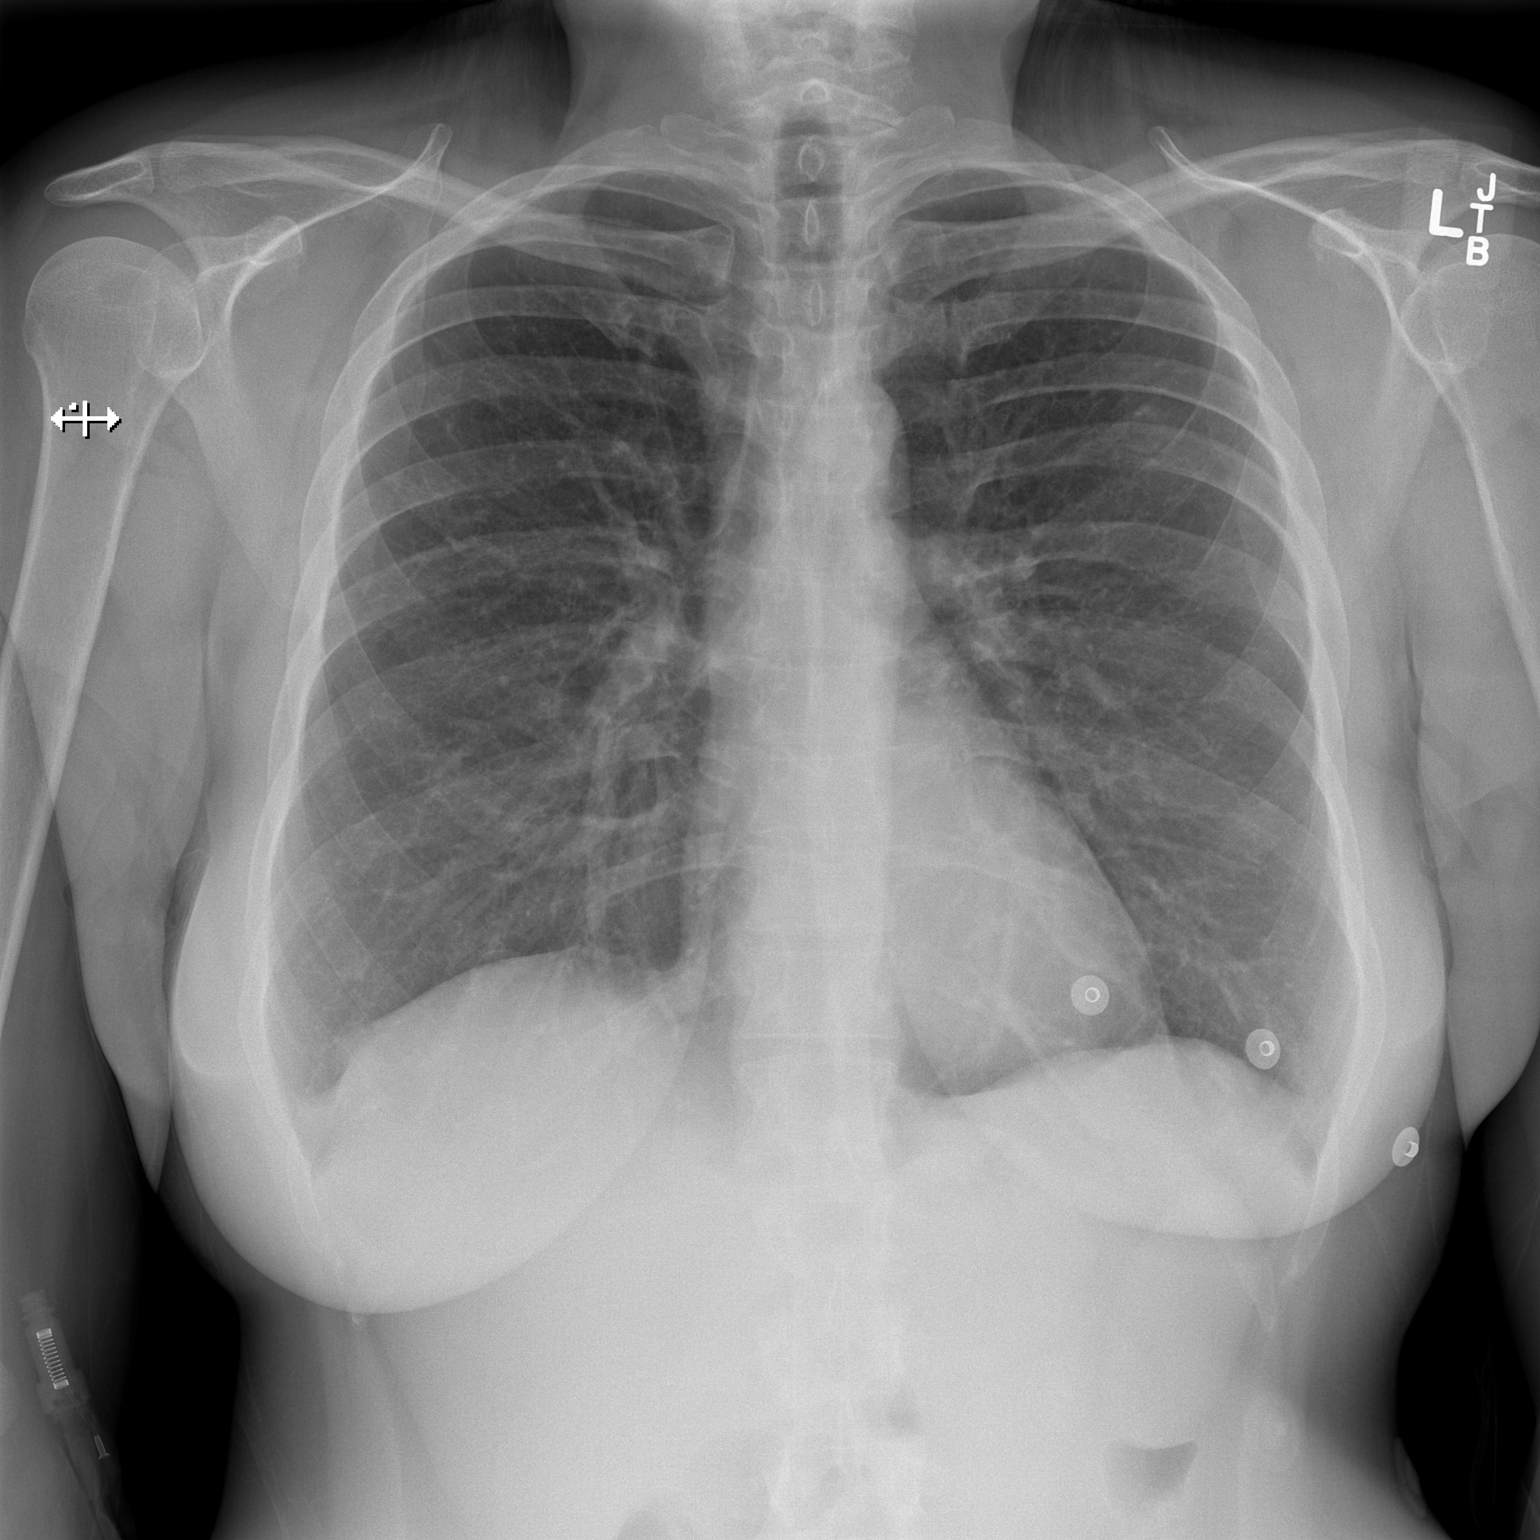

[w chest lat]
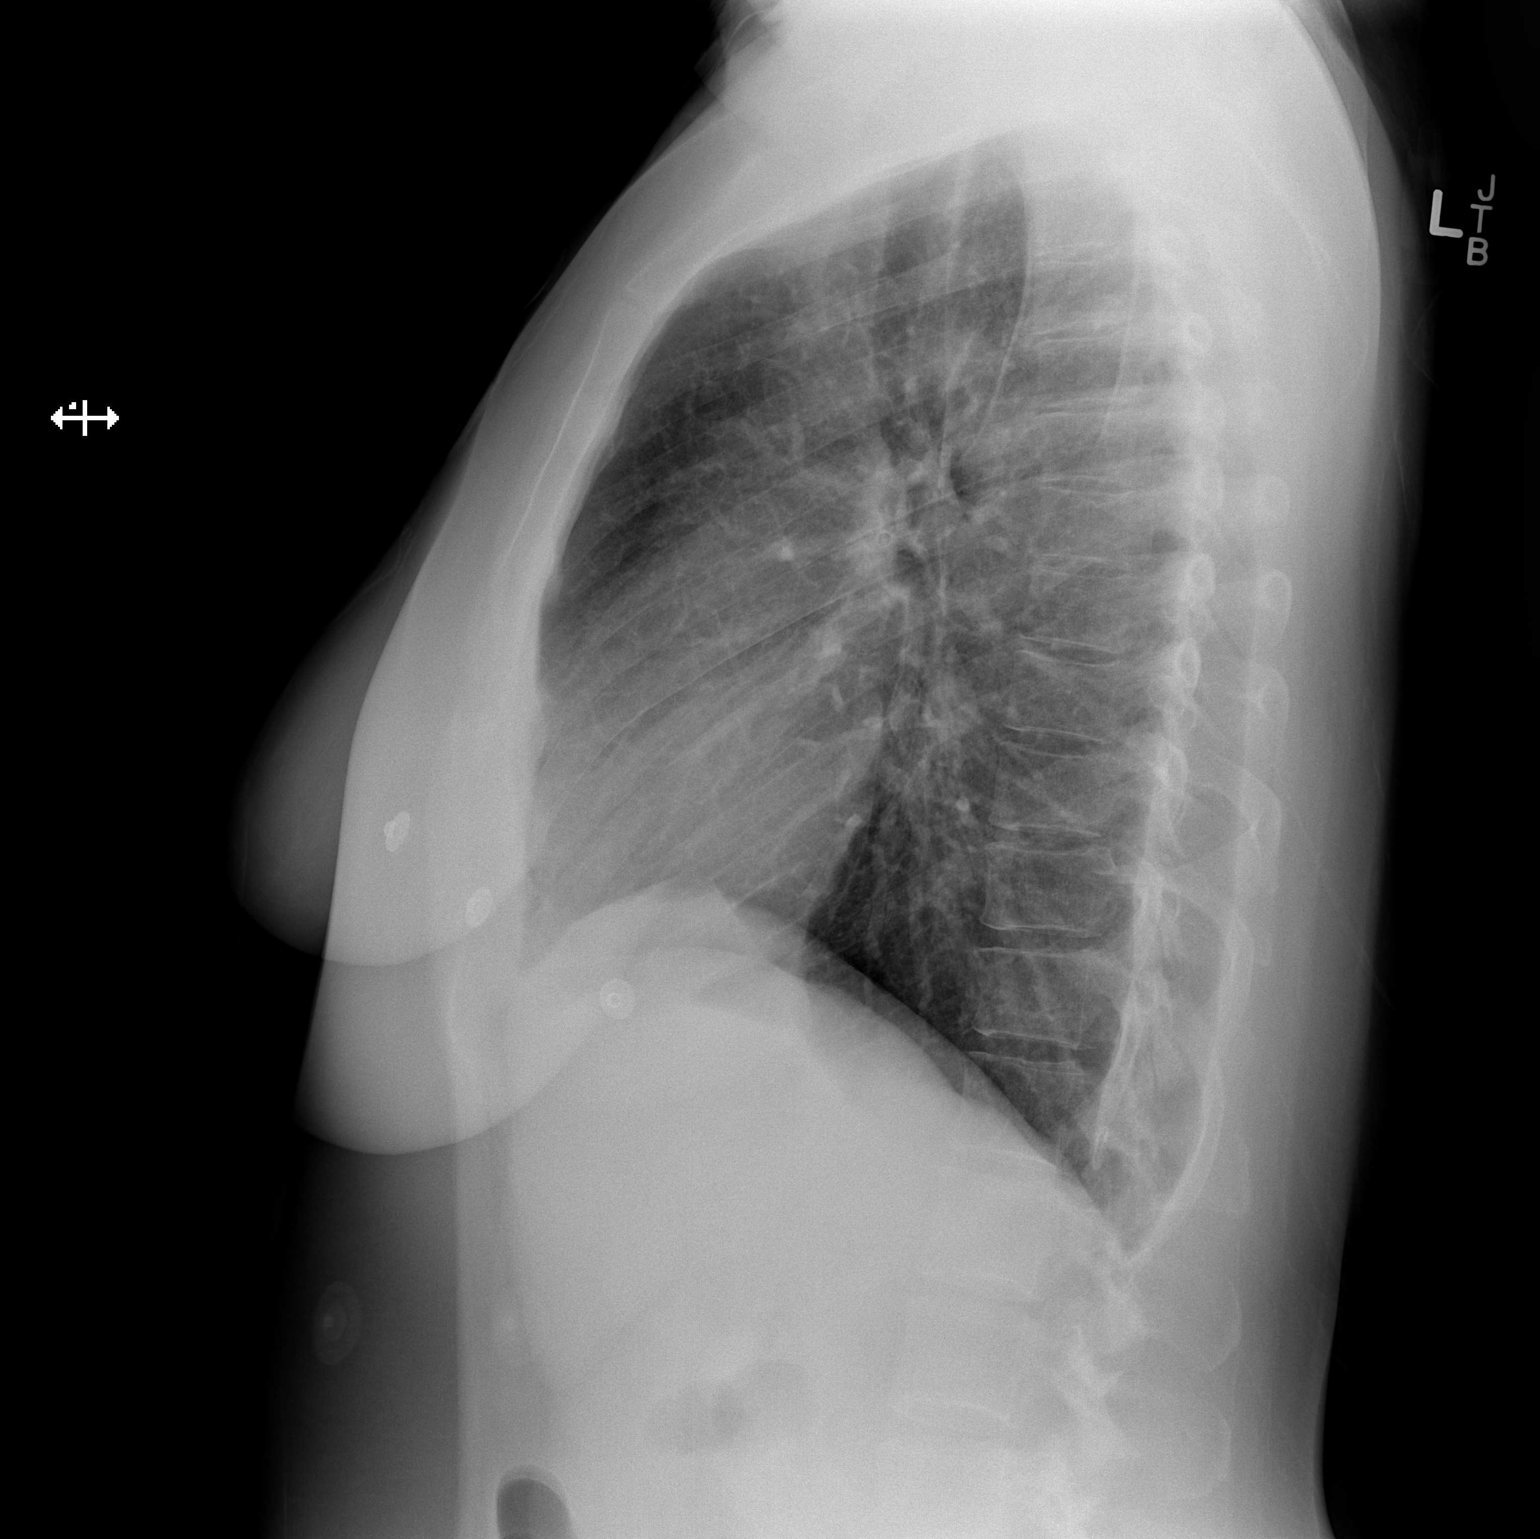

[2 of 2 positions shown; findings below may reference images not displayed]

FINDINGS: The heart size and mediastinal contours are within normal limits.
Both lungs are clear. The visualized skeletal structures are
unremarkable.
IMPRESSION: No active cardiopulmonary disease.

## 2017-10-02 IMAGING — DX DG CHEST 2V
2 series · 2 of 2 positions shown · non-contrast
Comparison: 11/12/2015

CLINICAL DATA: Chest pain today, radiating down right side.
Shortness of breath. Low grade fever. Pt states she had a heart
attack last week. Hx asthma, diabetes, HTN, smoker

EXAM:
CHEST  2 VIEW

[w chest pa]
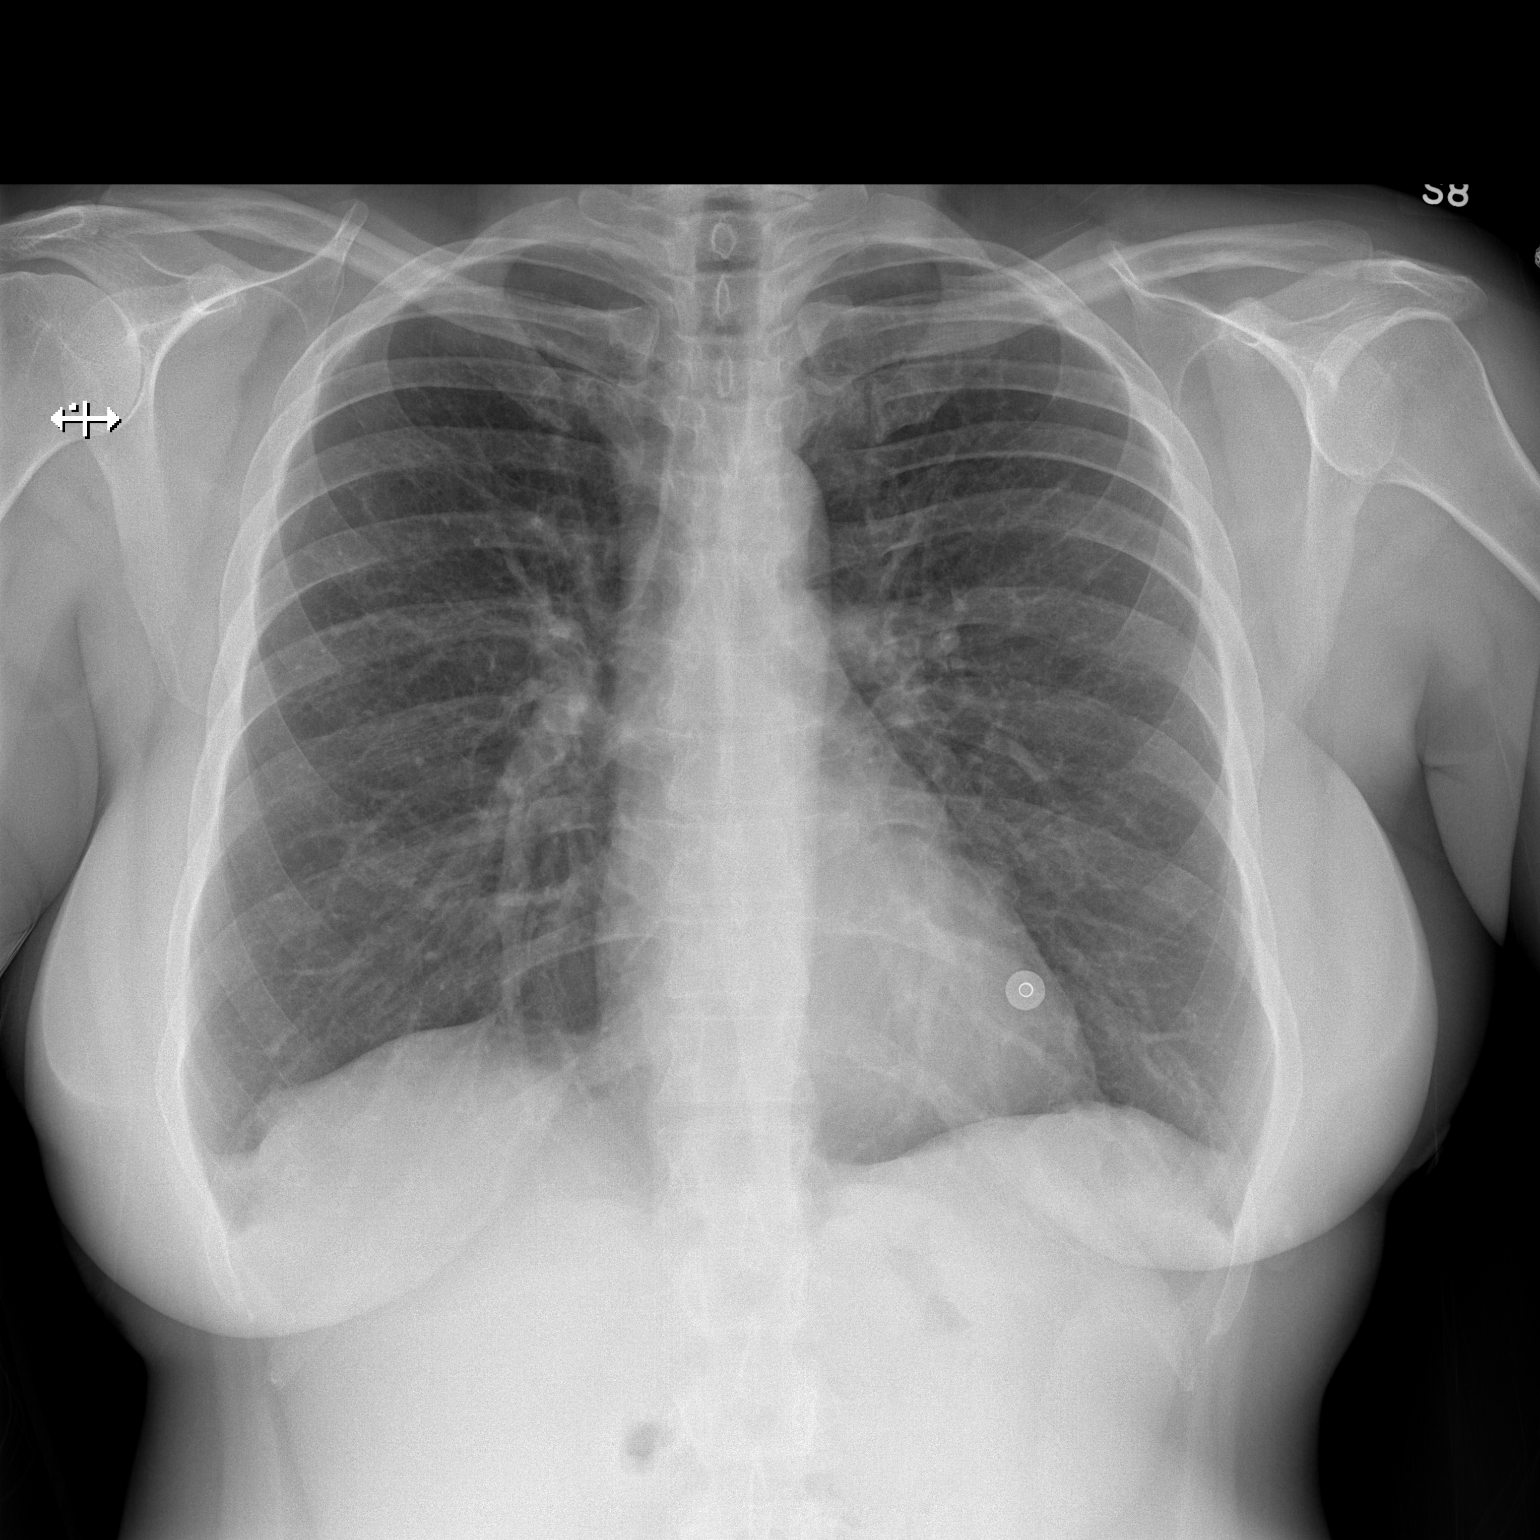

[w chest lat]
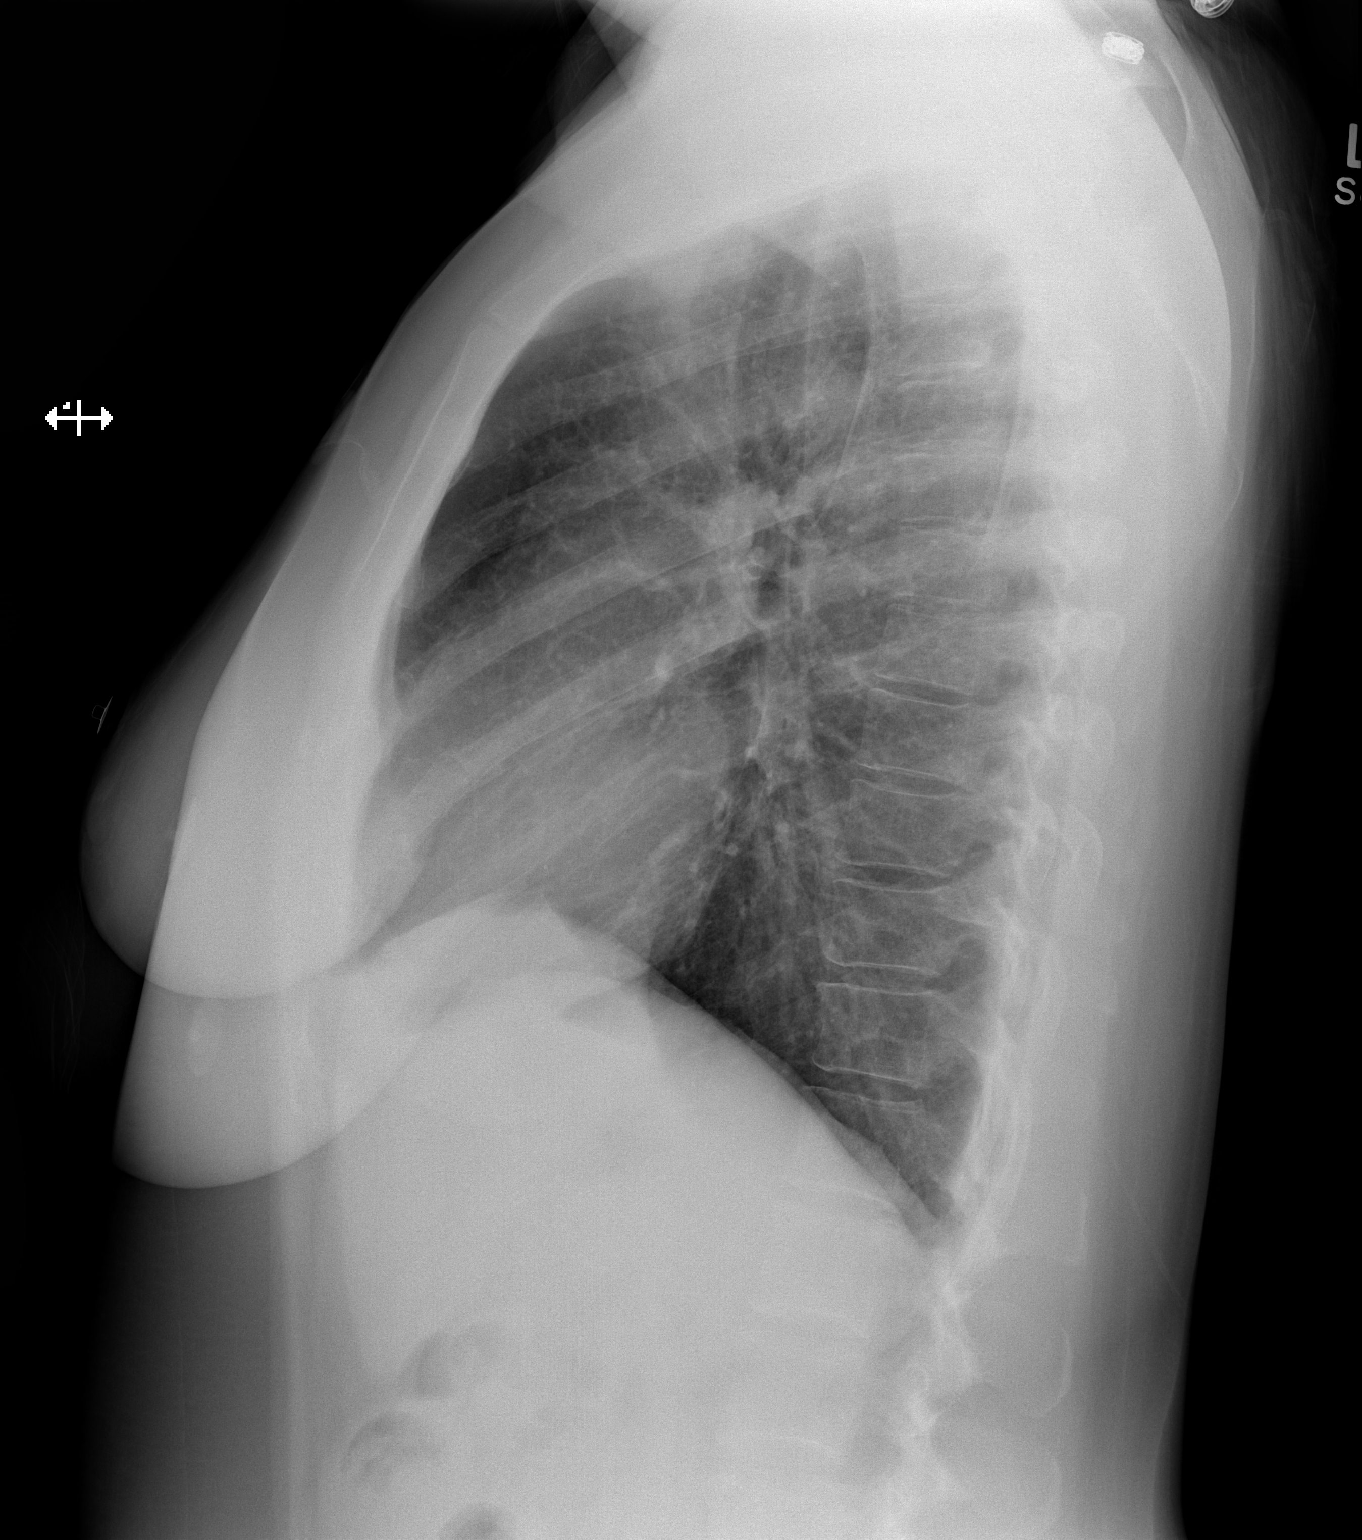

[2 of 2 positions shown; findings below may reference images not displayed]

FINDINGS: The heart size and mediastinal contours are within normal limits.
Both lungs are clear. No pleural effusion or pneumothorax. The
visualized skeletal structures are unremarkable.
IMPRESSION: No active cardiopulmonary disease.

## 2017-10-23 IMAGING — DX DG CHEST 2V
2 series · 2 of 2 positions shown · non-contrast
Comparison: 11/14/2015 and previous

CLINICAL DATA: Anxious feelings. Left-sided chest pain. Shortness
of breath.

EXAM:
CHEST  2 VIEW

[chest pa]
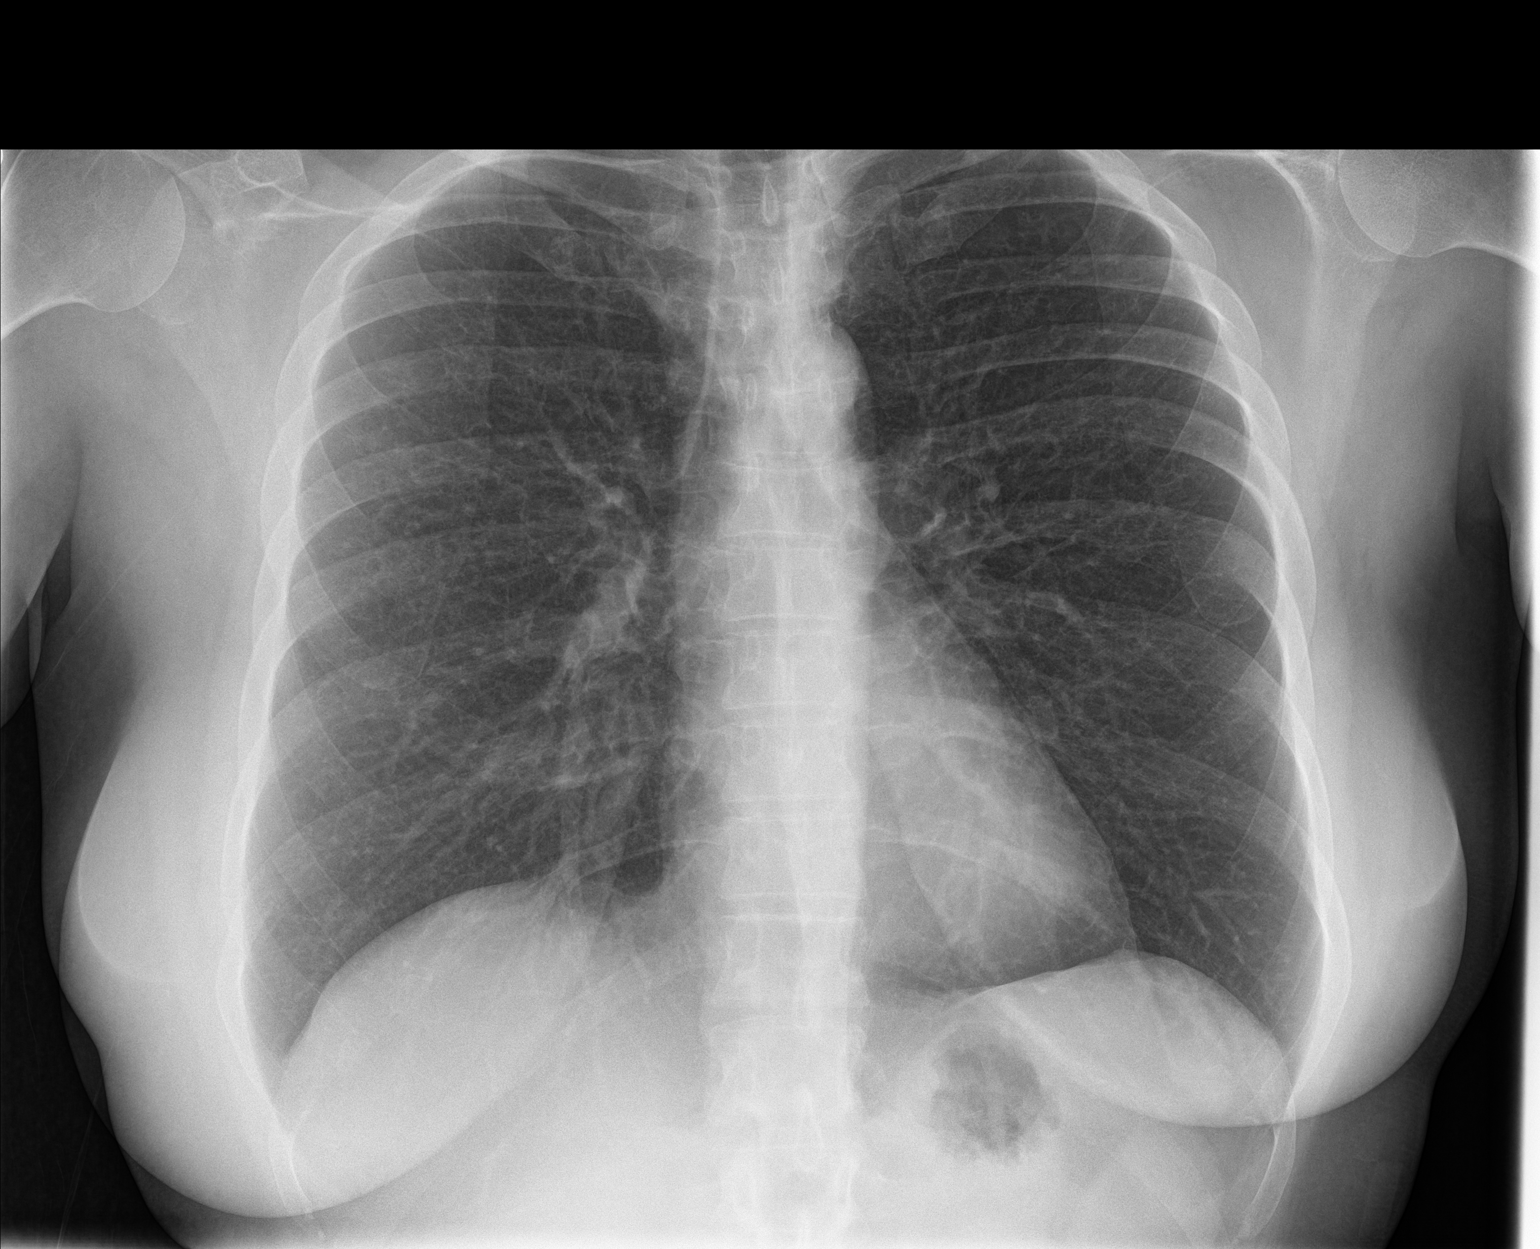

[chest lat]
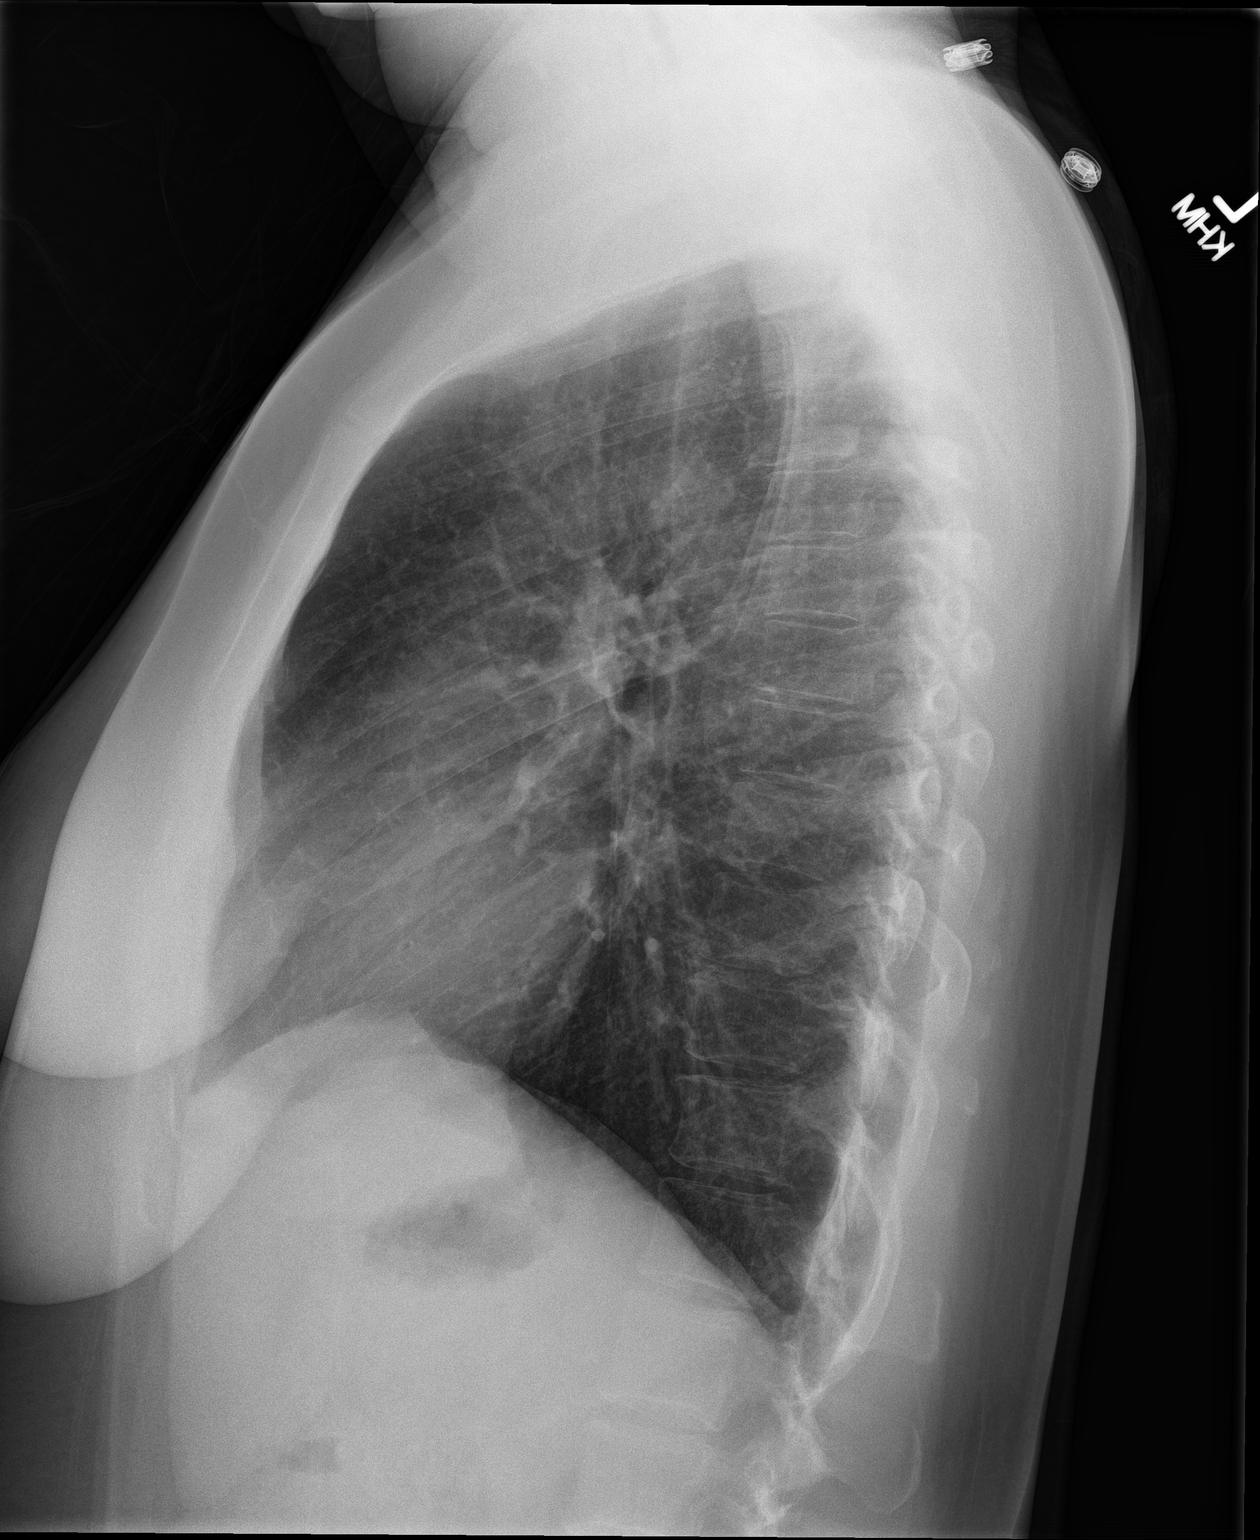

[2 of 2 positions shown; findings below may reference images not displayed]

FINDINGS: Heart size is normal. Mediastinal shadows are normal. The lungs are
clear. There is mild emphysema. No effusions. No bony abnormality.
IMPRESSION: No active disease.  Emphysema.

## 2018-05-07 ENCOUNTER — Emergency Department (HOSPITAL_COMMUNITY)
Admission: EM | Admit: 2018-05-07 | Discharge: 2018-05-07 | Disposition: A | Discharge status: Home / Self Care | Payer: Self-pay | Attending: Emergency Medicine | Admitting: Emergency Medicine

## 2018-05-07 ENCOUNTER — Other Ambulatory Visit: Payer: Self-pay

## 2018-05-07 ENCOUNTER — Emergency Department (HOSPITAL_COMMUNITY): Payer: Self-pay

## 2018-05-07 ENCOUNTER — Encounter (HOSPITAL_COMMUNITY): Payer: Self-pay | Admitting: *Deleted

## 2018-05-07 DIAGNOSIS — I10 Essential (primary) hypertension: Secondary | ICD-10-CM | POA: Insufficient documentation

## 2018-05-07 DIAGNOSIS — F419 Anxiety disorder, unspecified: Secondary | ICD-10-CM | POA: Insufficient documentation

## 2018-05-07 DIAGNOSIS — I252 Old myocardial infarction: Secondary | ICD-10-CM | POA: Insufficient documentation

## 2018-05-07 DIAGNOSIS — Z79899 Other long term (current) drug therapy: Secondary | ICD-10-CM | POA: Insufficient documentation

## 2018-05-07 DIAGNOSIS — F17210 Nicotine dependence, cigarettes, uncomplicated: Secondary | ICD-10-CM | POA: Insufficient documentation

## 2018-05-07 DIAGNOSIS — F3181 Bipolar II disorder: Secondary | ICD-10-CM | POA: Insufficient documentation

## 2018-05-07 DIAGNOSIS — R0789 Other chest pain: Secondary | ICD-10-CM | POA: Insufficient documentation

## 2018-05-07 LAB — COMPREHENSIVE METABOLIC PANEL
ALK PHOS: 51 U/L (ref 38–126)
ALT: 17 U/L (ref 0–44)
AST: 20 U/L (ref 15–41)
Albumin: 3.8 g/dL (ref 3.5–5.0)
Anion gap: 4 — ABNORMAL LOW (ref 5–15)
BILIRUBIN TOTAL: 0.4 mg/dL (ref 0.3–1.2)
BUN: 6 mg/dL (ref 6–20)
CALCIUM: 8.4 mg/dL — AB (ref 8.9–10.3)
CHLORIDE: 109 mmol/L (ref 98–111)
CO2: 25 mmol/L (ref 22–32)
Creatinine, Ser: 0.79 mg/dL (ref 0.44–1.00)
GFR calc non Af Amer: >60 mL/min (ref >60)
Glucose, Bld: 101 mg/dL — ABNORMAL HIGH (ref 70–99)
Potassium: 3.7 mmol/L (ref 3.5–5.1)
Sodium: 138 mmol/L (ref 135–145)
TOTAL PROTEIN: 6.3 g/dL — AB (ref 6.5–8.1)

## 2018-05-07 LAB — CBC WITH DIFFERENTIAL/PLATELET
Abs Immature Granulocytes: 0.11 10*3/uL — ABNORMAL HIGH (ref 0.00–0.07)
BASOS PCT: 1 %
Basophils Absolute: 0.1 10*3/uL (ref 0.0–0.1)
EOS PCT: 3 %
Eosinophils Absolute: 0.3 10*3/uL (ref 0.0–0.5)
HCT: 42.1 % (ref 36.0–46.0)
Hemoglobin: 12.8 g/dL (ref 12.0–15.0)
Immature Granulocytes: 1 %
Lymphocytes Relative: 30 %
Lymphs Abs: 3.3 10*3/uL (ref 0.7–4.0)
MCH: 28.1 pg (ref 26.0–34.0)
MCHC: 30.4 g/dL (ref 30.0–36.0)
MCV: 92.5 fL (ref 80.0–100.0)
MONOS PCT: 6 %
Monocytes Absolute: 0.7 10*3/uL (ref 0.1–1.0)
NEUTROS ABS: 6.4 10*3/uL (ref 1.7–7.7)
Neutrophils Relative %: 59 %
PLATELETS: 275 10*3/uL (ref 150–400)
RBC: 4.55 MIL/uL (ref 3.87–5.11)
RDW: 15.1 % (ref 11.5–15.5)
WBC: 10.9 10*3/uL — AB (ref 4.0–10.5)
nRBC: 0 % (ref 0.0–0.2)

## 2018-05-07 LAB — LIPASE, BLOOD: LIPASE: 21 U/L (ref 11–51)

## 2018-05-07 LAB — TROPONIN I
Troponin I: 0.17 ng/mL (ref <0.03)
Troponin I: 0.16 ng/mL (ref <0.03)

## 2018-05-07 MED ORDER — SODIUM CHLORIDE 0.9 % IV BOLUS
500.0000 mL | Freq: Once | INTRAVENOUS | Status: AC
  Administered 2018-05-07: 500 mL via INTRAVENOUS

## 2018-05-07 MED ORDER — FAMOTIDINE IN NACL 20-0.9 MG/50ML-% IV SOLN
20.0000 mg | Freq: Once | INTRAVENOUS | Status: AC
  Administered 2018-05-07: 20 mg via INTRAVENOUS
  Filled 2018-05-07: qty 50

## 2018-05-07 MED ORDER — CYCLOBENZAPRINE HCL 10 MG PO TABS: 10.0000 mg | ORAL_TABLET | Freq: Three times a day (TID) | ORAL | 0 refills | Status: AC | PRN

## 2018-05-07 MED ORDER — CYCLOBENZAPRINE HCL 10 MG PO TABS
ORAL_TABLET | ORAL | Status: AC
  Filled 2018-05-07: qty 1

## 2018-05-07 MED ORDER — CYCLOBENZAPRINE HCL 10 MG PO TABS
10.0000 mg | ORAL_TABLET | Freq: Once | ORAL | Status: AC
  Administered 2018-05-07: 10 mg via ORAL

## 2018-05-07 MED ORDER — ONDANSETRON HCL 4 MG/2ML IJ SOLN
4.0000 mg | Freq: Once | INTRAMUSCULAR | Status: AC
  Administered 2018-05-07: 4 mg via INTRAVENOUS
  Filled 2018-05-07: qty 2

## 2018-05-07 MED ORDER — SODIUM CHLORIDE 0.9 % IV BOLUS
1000.0000 mL | Freq: Once | INTRAVENOUS | Status: AC
  Administered 2018-05-07: 1000 mL via INTRAVENOUS

## 2018-05-07 NOTE — ED Triage Notes (Signed)
Pt c/o mid center, left side chest pain, upper abd pain that started this am with n/v and is made worse after eating.

## 2018-05-07 NOTE — Discharge Instructions (Addendum)
Avoid fried, spicy or greasy foods.  Consider increasing your protonix twice a day for the next 2 weeks. Look at the information about diet for GERD. Take the flexeril for your chest wall pain. Recheck with your doctor if you aren't improving.

## 2018-05-07 NOTE — ED Provider Notes (Signed)
[facility] EMERGENCY DEPARTMENT Provider Note   CSN: [id number] Arrival date & time: 05/07/18  0011  Time seen 12:35 AM   History   Chief Complaint Chief Complaint  Patient presents with  . Chest Pain    HPI Melissa Butler is a 42 y.o. female.  HPI patient states she has been having nausea and vomiting off and on for the past 3 days.  She has 2-3 episodes per day.  She also is having diarrhea about twice a day.  She states her BMs are dark but not black.  She is not having vomiting of blood.  She states she woke up on October 13 having left lower anterior chest pain that radiates underneath her left breast into her left flank.  She states the pain is constant and describes it as constant, achy, burning, and tightness.  She also describes some burning reflux fluid.  She states she also started having some right upper quadrant pain that she has not had before.  She states she is still on Protonix.  She did have an endoscopy 3 years ago that she states was normal.  She states this pain is different from what she had 3 years ago.  Patient also complains of shortness of breath constantly for the past month that is worse with exertion.  She has had a cough and production of green mucus for the past 2 weeks.  She denies known fever but states she has had chills for 3 days.  Patient states she has cardiomyopathy and is followed by a cardiologist in [place].  PCP Dr Atlee Abide in [place]  Past Medical History:  Diagnosis Date  . Anxiety   . Chronic abdominal pain   . Chronic pelvic pain in female   . Depression   . GERD (gastroesophageal reflux disease)   . History of cardiac catheterization 11/06/2015   normal coronary arteries  . History of echocardiogram 11/06/2015   normal  . Hypertension   . MI (myocardial infarction) (HCC) 10/2015   elevated troponin, likely due to vasospasm, clean coronary arteries on cath  . Panic attack     Patient Active Problem List   Diagnosis Date Noted    . Bipolar disorder with psychotic features (HCC) 04/08/2017  . Tobacco use disorder 09/16/2016  . 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 Benzodiazepine withdrawal (HCC) 09/15/2016  . PTSD (post-traumatic stress disorder) 09/15/2016  . HTN (hypertension) 09/14/2016  . GERD (gastroesophageal reflux disease) 09/14/2016  . Suicidal ideation 09/14/2016  . Bipolar 2 disorder, major depressive episode (HCC) 09/13/2016  . Insomnia   . Anxiety   . 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 Panic disorder 01/04/2016  . NSTEMI (non-ST elevated myocardial infarction) (HCC) 11/06/2015  . Normal coronary arteries 11/06/15 11/06/2015    Past Surgical History:  Procedure Laterality Date  . CARDIAC CATHETERIZATION N/A 11/06/2015   Procedure: Left Heart Cath and Coronary Angiography;  Surgeon: Thurmon Fair, MD;  Location: [facility] INVASIVE CV LAB;  Service: Cardiovascular;  Laterality: N/A;  . CESAREAN SECTION    . NO PAST SURGERIES       OB History    Gravida      Para      Term      Preterm      AB      Living  2     SAB      TAB      Ectopic      Multiple  Live Births               Home Medications     Patient states she uses albuterol as needed, she is on metoprolol 25 mg twice a day, Protonix once a day, trazodone 200 mg at bedtime, Zoloft, and Abilify.  Prior to Admission medications   Medication Sig Start Date End Date Taking? Authorizing Provider  albuterol (PROVENTIL) (2.5 MG/3ML) 0.083% nebulizer solution Inhale 3 mLs into the lungs every 6 (six) hours as needed for wheezing or shortness of breath. Patient not taking: Reported on 04/08/2017 09/18/16   Pucilowska, Ellin Goodie, MD  beclomethasone (QVAR) 80 MCG/ACT inhaler Inhale 2 puffs into the lungs 2 (two) times daily.    [provider]  cyclobenzaprine (FLEXERIL) 10 MG tablet Take 1 tablet (10 mg total) by mouth 3 (three) times daily as needed for muscle spasms.  05/07/18   Devoria Albe, MD  FLUoxetine (PROZAC) 40 MG capsule Take 1 capsule (40 mg total) by mouth at bedtime. Patient not taking: Reported on 04/08/2017 09/18/16   Pucilowska, Ellin Goodie, MD  gabapentin (NEURONTIN) 100 MG capsule Take 2 capsules (200 mg total) by mouth 3 (three) times daily. Patient not taking: Reported on 04/08/2017 09/18/16   Shari Prows, MD  hydrOXYzine (ATARAX/VISTARIL) 50 MG tablet Take 1 tablet (50 mg total) by mouth 3 (three) times daily as needed for anxiety. Patient not taking: Reported on 04/08/2017 09/18/16   Pucilowska, Braulio Conte B, MD  ibuprofen (ADVIL,MOTRIN) 200 MG tablet Take 400 mg by mouth every 6 (six) hours as needed.    [provider]  Ibuprofen-Diphenhydramine HCl (ADVIL PM) 200-25 MG CAPS Take 2 capsules by mouth at bedtime as needed.    [provider]  metoprolol (LOPRESSOR) 50 MG tablet Take 1 tablet (50 mg total) by mouth 2 (two) times daily. Patient not taking: Reported on 04/08/2017 09/18/16   Kristine Linea B, MD  metoprolol tartrate (LOPRESSOR) 25 MG tablet Take 1 tablet (25 mg total) by mouth 2 (two) times daily. 09/18/16   Pucilowska, Jolanta B, MD  pantoprazole (PROTONIX) 40 MG tablet Take 1 tablet (40 mg total) by mouth daily. Patient not taking: Reported on 04/08/2017 09/18/16   Shari Prows, MD  traZODone (DESYREL) 100 MG tablet Take 2 tablets (200 mg total) by mouth at bedtime. 09/18/16   Pucilowska, Braulio Conte B, MD  ziprasidone (GEODON) 80 MG capsule Take 1 capsule (80 mg total) by mouth 2 (two) times daily with a meal. Patient not taking: Reported on 04/08/2017 09/18/16   Shari Prows, MD    Family History No family history on file.  Social History Social History   Tobacco Use  . Smoking status: Current Every Day Smoker    Packs/day: 0.50    Years: 5.00    Pack years: 2.50    Types: Cigarettes  . Smokeless tobacco: Never Used  Substance Use Topics  . Alcohol use: No  . Drug use: No  applying  for disability for cardiomyopathy and "nerves"   Allergies   Ciprofloxacin; Penicillins; Toradol [ketorolac tromethamine]; Amoxicillin; Codeine; Metronidazole; Saphris [asenapine]; and Sulfa antibiotics   Review of Systems Review of Systems  All other systems reviewed and are negative.    Physical Exam Updated Vital Signs BP 129/62   Pulse 90   Temp 98.6 F (37 C) (Oral)   Resp (!) 27   Ht 5' 3.5" (1.613 m)   Wt 96.2 kg   LMP 04/24/2018   SpO2 94%  BMI 36.97 kg/m   Vital signs normal    Physical Exam  Constitutional: She is oriented to person, place, and time. She appears well-developed and well-nourished.  Non-toxic appearance. She does not appear ill. No distress.  HENT:  Head: Normocephalic and atraumatic.  Right Ear: External ear normal.  Left Ear: External ear normal.  Nose: Nose normal. No mucosal edema or rhinorrhea.  Mouth/Throat: Oropharynx is clear and moist and mucous membranes are normal. No dental abscesses or uvula swelling.  Eyes: Pupils are equal, round, and reactive to light. Conjunctivae and EOM are normal.  Neck: Normal range of motion and full passive range of motion without pain. Neck supple.  Cardiovascular: Normal rate and regular rhythm. Exam reveals no gallop and no friction rub.  Murmur heard.  Systolic murmur is present with a grade of 1/6. Pulmonary/Chest: Effort normal and breath sounds normal. No respiratory distress. She has no wheezes. She has no rhonchi. She has no rales. She exhibits tenderness. She exhibits no crepitus.    Abdominal: Soft. Normal appearance and bowel sounds are normal. She exhibits no distension. There is tenderness in the right upper quadrant and epigastric area. There is no rebound and no guarding.  Musculoskeletal: Normal range of motion. She exhibits no edema or tenderness.  Moves all extremities well.   Neurological: She is alert and oriented to person, place, and time. She has normal strength. No cranial nerve  deficit.  Skin: Skin is warm, dry and intact. No rash noted. No erythema. No pallor.  Psychiatric: She has a normal mood and affect. Her speech is normal and behavior is normal. Her mood appears not anxious.  Nursing note and vitals reviewed.    ED Treatments / Results  Labs (all labs ordered are listed, but only abnormal results are displayed) Results for orders placed or performed during the hospital encounter of 05/07/18  Comprehensive metabolic panel  Result Value Ref Range   Sodium 138 135 - 145 mmol/L   Potassium 3.7 3.5 - 5.1 mmol/L   Chloride 109 98 - 111 mmol/L   CO2 25 22 - 32 mmol/L   Glucose, Bld 101 (H) 70 - 99 mg/dL   BUN 6 6 - 20 mg/dL   Creatinine, Ser 1.61 0.44 - 1.00 mg/dL   Calcium 8.4 (L) 8.9 - 10.3 mg/dL   Total Protein 6.3 (L) 6.5 - 8.1 g/dL   Albumin 3.8 3.5 - 5.0 g/dL   AST 20 15 - 41 U/L   ALT 17 0 - 44 U/L   Alkaline Phosphatase 51 38 - 126 U/L   Total Bilirubin 0.4 0.3 - 1.2 mg/dL   GFR calc non Af Amer >60 >60 mL/min   GFR calc Af Amer >60 >60 mL/min   Anion gap 4 (L) 5 - 15  Lipase, blood  Result Value Ref Range   Lipase 21 11 - 51 U/L  CBC with Differential  Result Value Ref Range   WBC 10.9 (H) 4.0 - 10.5 K/uL   RBC 4.55 3.87 - 5.11 MIL/uL   Hemoglobin 12.8 12.0 - 15.0 g/dL   HCT 09.6 04.5 - 40.9 %   MCV 92.5 80.0 - 100.0 fL   MCH 28.1 26.0 - 34.0 pg   MCHC 30.4 30.0 - 36.0 g/dL   RDW 81.1 91.4 - 78.2 %   Platelets 275 150 - 400 K/uL   nRBC 0.0 0.0 - 0.2 %   Neutrophils Relative % 59 %   Neutro Abs 6.4 1.7 - 7.7 K/uL  Lymphocytes Relative 30 %   Lymphs Abs 3.3 0.7 - 4.0 K/uL   Monocytes Relative 6 %   Monocytes Absolute 0.7 0.1 - 1.0 K/uL   Eosinophils Relative 3 %   Eosinophils Absolute 0.3 0.0 - 0.5 K/uL   Basophils Relative 1 %   Basophils Absolute 0.1 0.0 - 0.1 K/uL   Immature Granulocytes 1 %   Abs Immature Granulocytes 0.11 (H) 0.00 - 0.07 K/uL  Troponin I  Result Value Ref Range   Troponin I 0.17 (HH) <0.03 ng/mL    Troponin I  Result Value Ref Range   Troponin I 0.16 (HH) <0.03 ng/mL   Laboratory interpretation all normal except stable level delta troponin, minimal leukocytosis    EKG EKG Interpretation  Date/Time:  Monday May 07 2018 00:21:22 EDT Ventricular Rate:  84 PR Interval:    QRS Duration: 86 QT Interval:  380 QTC Calculation: 450 R Axis:   -54 Text Interpretation:  Sinus rhythm Left anterior fascicular block Low voltage, precordial leads Abnormal R-wave progression, late transition Electrode noise Since last tracing rate slower 08 Apr 2017 Confirmed by Devoria Albe ([id number]) on 05/07/2018 12:32:52 AM   Radiology Dg Chest 2 View  Result Date: 05/07/2018 CLINICAL DATA:  Central and left-sided chest pain as well as upper abdominal pain starting this morning with nausea and vomiting, worse with eating. EXAM: CHEST - 2 VIEW COMPARISON:  09/11/2017 FINDINGS: The heart size and mediastinal contours are within normal limits. Both lungs are clear. The visualized skeletal structures are unremarkable. IMPRESSION: No active cardiopulmonary disease. Electronically Signed   By: Tollie Eth M.D.   On: 05/07/2018 01:42    Procedures Procedures (including critical care time)  Medications Ordered in ED Medications  sodium chloride 0.9 % bolus 1,000 mL (0 mLs Intravenous Stopped 05/07/18 0343)  sodium chloride 0.9 % bolus 500 mL ( Intravenous Stopped 05/07/18 0138)  famotidine (PEPCID) IVPB 20 mg premix (0 mg Intravenous Stopped 05/07/18 0209)  ondansetron (ZOFRAN) injection 4 mg (4 mg Intravenous Given 05/07/18 0141)  cyclobenzaprine (FLEXERIL) tablet 10 mg (10 mg Oral Given 05/07/18 0341)     Initial Impression / Assessment and Plan / ED Course  I have reviewed the triage vital signs and the nursing notes.  Pertinent labs & imaging results that were available during my care of the patient were reviewed by me and considered in my medical decision making (see chart for details).    Patient's first troponin is mildly elevated, however they have always been positive when I review her priors in September, at that point they were much higher though it 0.5 range.  A 2-hour one was done, she has a history of cardiomyopathy and that may account for her mildly elevated troponin.  Patient's delta troponin is level.  This is most likely from her cardiomyopathy.  She was given Flexeril for her chest wall pain.  Her LFTs are all normal and her lipase is normal, I do not suspect she is having acute cholecystitis.  Patient continued to ask for pain medication.  She was given Flexeril for her chest wall pain.  At time of discharge we discussed her test results.  She has had a persistent leukocytosis since December however her white blood cell count elevation today is minimal and much improved.  Her LFTs and lipase were normal so I have a low index of suspicion for gallbladder disease.  I suspect she is just having a flareup of her GERD.  She was discharged home with Flexeril  for her chest wall pain.  Review of the [place] database shows patient gets #90 clonazepam 0.5 mg monthly, last filled October 4, she got #20 Fiorinal on October 6, she is gotten multiple small doses of hydrocodone 3 times in 2019, she got tramadol on September 13.  She is gotten 16 prescriptions from 9 prescribers and it states she is used 5 pharmacies.  Final Clinical Impressions(s) / ED Diagnoses   Final diagnoses:  Chest wall pain    ED Discharge Orders         Ordered    cyclobenzaprine (FLEXERIL) 10 MG tablet  3 times daily PRN     05/07/18 0446          Plan discharge  Devoria Albe, MD, Concha Pyo, MD 05/07/18 [phone]

## 2018-05-07 NOTE — ED Notes (Signed)
Date and time results received: 05/07/18 0228   Test: Trop Critical Value: 0.17  Name of Provider Notified: DR Lynelle Doctor  Orders Received? Or Actions Taken?: MD notified

## 2019-06-11 ENCOUNTER — Other Ambulatory Visit: Payer: Self-pay

## 2019-06-11 ENCOUNTER — Emergency Department (HOSPITAL_COMMUNITY)
Admission: EM | Admit: 2019-06-11 | Discharge: 2019-06-11 | Disposition: A | Discharge status: Home / Self Care | Payer: Medicaid - Out of State | Attending: Emergency Medicine | Admitting: Emergency Medicine

## 2019-06-11 DIAGNOSIS — Z5321 Procedure and treatment not carried out due to patient leaving prior to being seen by health care provider: Secondary | ICD-10-CM | POA: Diagnosis not present

## 2019-06-11 DIAGNOSIS — R0602 Shortness of breath: Secondary | ICD-10-CM | POA: Insufficient documentation

## 2019-06-11 NOTE — ED Notes (Signed)
Pt states she has pneumonia and is taking antibiotics for it. Pt states she feels like they are not working. VSS. Pt states that she will just go home because "the wait is too long."

## 2019-07-12 ENCOUNTER — Ambulatory Visit
Admission: EM | Admit: 2019-07-12 | Discharge: 2019-07-12 | Disposition: A | Discharge status: Home / Self Care | Payer: Medicaid - Out of State | Attending: Emergency Medicine | Admitting: Emergency Medicine

## 2019-07-12 ENCOUNTER — Emergency Department (HOSPITAL_COMMUNITY): Payer: Medicaid - Out of State

## 2019-07-12 ENCOUNTER — Encounter (HOSPITAL_COMMUNITY): Payer: Self-pay | Admitting: Emergency Medicine

## 2019-07-12 ENCOUNTER — Other Ambulatory Visit: Payer: Self-pay

## 2019-07-12 ENCOUNTER — Emergency Department (HOSPITAL_COMMUNITY)
Admission: EM | Admit: 2019-07-12 | Discharge: 2019-07-12 | Disposition: A | Discharge status: Home / Self Care | Payer: Medicaid - Out of State | Attending: Emergency Medicine | Admitting: Emergency Medicine

## 2019-07-12 DIAGNOSIS — R05 Cough: Secondary | ICD-10-CM | POA: Diagnosis not present

## 2019-07-12 DIAGNOSIS — R0782 Intercostal pain: Secondary | ICD-10-CM

## 2019-07-12 DIAGNOSIS — Z5321 Procedure and treatment not carried out due to patient leaving prior to being seen by health care provider: Secondary | ICD-10-CM | POA: Insufficient documentation

## 2019-07-12 DIAGNOSIS — R0602 Shortness of breath: Secondary | ICD-10-CM | POA: Diagnosis not present

## 2019-07-12 DIAGNOSIS — R0789 Other chest pain: Secondary | ICD-10-CM | POA: Diagnosis not present

## 2019-07-12 DIAGNOSIS — R059 Cough, unspecified: Secondary | ICD-10-CM

## 2019-07-12 DIAGNOSIS — Z20822 Contact with and (suspected) exposure to covid-19: Secondary | ICD-10-CM

## 2019-07-12 DIAGNOSIS — Z711 Person with feared health complaint in whom no diagnosis is made: Secondary | ICD-10-CM | POA: Insufficient documentation

## 2019-07-12 DIAGNOSIS — Z20828 Contact with and (suspected) exposure to other viral communicable diseases: Secondary | ICD-10-CM

## 2019-07-12 MED ORDER — IBUPROFEN 400 MG PO TABS: 400.0000 mg | ORAL_TABLET | Freq: Four times a day (QID) | ORAL | 0 refills | Status: AC | PRN

## 2019-07-12 MED ORDER — SODIUM CHLORIDE 0.9% FLUSH: 3.0000 mL | Freq: Once | INTRAVENOUS | Status: DC

## 2019-07-12 MED ORDER — ALBUTEROL SULFATE HFA 108 (90 BASE) MCG/ACT IN AERS: 1.0000 | INHALATION_SPRAY | Freq: Four times a day (QID) | RESPIRATORY_TRACT | 0 refills | Status: AC | PRN

## 2019-07-12 MED ORDER — BENZONATATE 100 MG PO CAPS: 100.0000 mg | ORAL_CAPSULE | Freq: Three times a day (TID) | ORAL | 0 refills | Status: AC

## 2019-07-12 NOTE — ED Provider Notes (Signed)
[facility]    CSN: [id number] Arrival date & time: 07/12/19  1913      History   Chief Complaint Chief Complaint  Patient presents with  . Shortness of Breath    HPI Melissa Butler is a 43 y.o. female.   Melissa Butler 43 years old female with history of pneumonia presents to the urgent care for complaint of shortness of breath, cough and rib cage pain for the past 2-3 weeks.  Previously went to the ED at [facility], was triage and left without being seen. She rated her rib cage pain at 5 on a  Scale of 1-10 and it is aching in quality. Denies sick exposure to COVID, flu or strep.  Denies recent travel.  Denies aggravating or alleviating symptoms.  Denies previous COVID infection.   Denies fever, chills, fatigue, nasal congestion, rhinorrhea, sore throat, cough, wheezing, nausea, vomiting, changes in bowel or bladder habits.    The history is provided by the patient. No language interpreter was used.  Shortness of Breath Associated symptoms: cough     Past Medical History:  Diagnosis Date  . Anxiety   . Chronic abdominal pain   . Chronic pelvic pain in female   . Depression   . GERD (gastroesophageal reflux disease)   . History of cardiac catheterization 11/06/2015   normal coronary arteries  . History of echocardiogram 11/06/2015   normal  . Hypertension   . MI (myocardial infarction) (HCC) 10/2015   elevated troponin, likely due to vasospasm, clean coronary arteries on cath  . Panic attack     Patient Active Problem List   Diagnosis Date Noted  . Bipolar disorder with psychotic features (HCC) 04/08/2017  . Tobacco use disorder 09/16/2016  . 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 Benzodiazepine withdrawal (HCC) 09/15/2016  . PTSD (post-traumatic stress disorder) 09/15/2016  . HTN (hypertension) 09/14/2016  . GERD (gastroesophageal reflux disease) 09/14/2016  . Suicidal ideation 09/14/2016  . Bipolar 2  disorder, major depressive episode (HCC) 09/13/2016  . Insomnia   . Anxiety   . 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 Panic disorder 01/04/2016  . NSTEMI (non-ST elevated myocardial infarction) (HCC) 11/06/2015  . Normal coronary arteries 11/06/15 11/06/2015    Past Surgical History:  Procedure Laterality Date  . CARDIAC CATHETERIZATION N/A 11/06/2015   Procedure: Left Heart Cath and Coronary Angiography;  Surgeon: Thurmon Fair, MD;  Location: [facility];  Service: Cardiovascular;  Laterality: N/A;  . CESAREAN SECTION    . NO PAST SURGERIES      OB History    Gravida      Para      Term      Preterm      AB      Living  2     SAB      TAB      Ectopic      Multiple      Live Births               Home Medications    Prior to Admission medications   Medication Sig Start Date End Date Taking? Authorizing Provider  albuterol (VENTOLIN HFA) 108 (90 Base) MCG/ACT inhaler Inhale 1-2 puffs into the lungs every 6 (six) hours as needed for wheezing or shortness of breath. 07/12/19   Jermanie Minshall, Zachery Dakins, FNP  beclomethasone (QVAR) 80 MCG/ACT inhaler Inhale 2 puffs into the lungs 2 (two) times daily.    [provider]  benzonatate (TESSALON) 100 MG capsule Take 1 capsule (100 mg total) by mouth every 8 (eight) hours. 07/12/19   Johncharles Fusselman, Darrelyn Hillock, FNP  cyclobenzaprine (FLEXERIL) 10 MG tablet Take 1 tablet (10 mg total) by mouth 3 (three) times daily as needed for muscle spasms. 05/07/18   Rolland Porter, MD  FLUoxetine (PROZAC) 40 MG capsule Take 1 capsule (40 mg total) by mouth at bedtime. Patient not taking: Reported on 04/08/2017 09/18/16   Pucilowska, Wardell Honour, MD  gabapentin (NEURONTIN) 100 MG capsule Take 2 capsules (200 mg total) by mouth 3 (three) times daily. Patient not taking: Reported on 04/08/2017 09/18/16   Clovis Fredrickson, MD  hydrOXYzine (ATARAX/VISTARIL) 50 MG tablet Take 1 tablet (50 mg total) by mouth 3 (three) times daily as needed for  anxiety. Patient not taking: Reported on 04/08/2017 09/18/16   Pucilowska, Herma Ard B, MD  ibuprofen (ADVIL) 400 MG tablet Take 1 tablet (400 mg total) by mouth every 6 (six) hours as needed. 07/12/19   Feliberto Stockley, Darrelyn Hillock, FNP  Ibuprofen-Diphenhydramine HCl (ADVIL PM) 200-25 MG CAPS Take 2 capsules by mouth at bedtime as needed.    [provider]  metoprolol (LOPRESSOR) 50 MG tablet Take 1 tablet (50 mg total) by mouth 2 (two) times daily. Patient not taking: Reported on 04/08/2017 09/18/16   Orson Slick B, MD  metoprolol tartrate (LOPRESSOR) 25 MG tablet Take 1 tablet (25 mg total) by mouth 2 (two) times daily. 09/18/16   Pucilowska, Jolanta B, MD  pantoprazole (PROTONIX) 40 MG tablet Take 1 tablet (40 mg total) by mouth daily. Patient not taking: Reported on 04/08/2017 09/18/16   Clovis Fredrickson, MD  traZODone (DESYREL) 100 MG tablet Take 2 tablets (200 mg total) by mouth at bedtime. 09/18/16   Pucilowska, Herma Ard B, MD  ziprasidone (GEODON) 80 MG capsule Take 1 capsule (80 mg total) by mouth 2 (two) times daily with a meal. Patient not taking: Reported on 04/08/2017 09/18/16   Clovis Fredrickson, MD    Family History History reviewed. No pertinent family history.  Social History Social History   Tobacco Use  . Smoking status: Current Every Day Smoker    Packs/day: 0.50    Years: 5.00    Pack years: 2.50    Types: Cigarettes  . Smokeless tobacco: Never Used  Substance Use Topics  . Alcohol use: No  . Drug use: No     Allergies   Ciprofloxacin, Penicillins, Toradol [ketorolac tromethamine], Amoxicillin, Codeine, Metronidazole, Saphris [asenapine], and Sulfa antibiotics   Review of Systems Review of Systems  Constitutional: Negative.   HENT: Negative.   Respiratory: Positive for cough and shortness of breath.   Cardiovascular: Negative.   Gastrointestinal: Negative.   Musculoskeletal:       Rib cage pain  Neurological: Negative.      Physical  Exam Triage Vital Signs ED Triage Vitals  Enc Vitals Group     BP 07/12/19 1925 131/83     Pulse Rate 07/12/19 1925 (!) 104     Resp 07/12/19 1925 18     Temp 07/12/19 1925 98.6 F (37 C)     Temp src --      SpO2 07/12/19 1925 94 %     Weight --      Height --      Head Circumference --      Peak Flow --      Pain Score 07/12/19 1922 9     Pain Loc --  Pain Edu? --      Excl. in GC? --    No data found.  Updated Vital Signs BP 131/83   Pulse (!) 104   Temp 98.6 F (37 C)   Resp 18   LMP 06/21/2019   SpO2 94%   Visual Acuity Right Eye Distance:   Left Eye Distance:   Bilateral Distance:    Right Eye Near:   Left Eye Near:    Bilateral Near:     Physical Exam Constitutional:      General: She is not in acute distress.    Appearance: Normal appearance. She is normal weight. She is not ill-appearing.  HENT:     Head: Normocephalic.     Right Ear: Tympanic membrane, ear canal and external ear normal. There is no impacted cerumen.     Left Ear: Ear canal and external ear normal. There is no impacted cerumen.     Nose: Nose normal. No congestion.     Mouth/Throat:     Mouth: Mucous membranes are moist.     Pharynx: No oropharyngeal exudate or posterior oropharyngeal erythema.  Cardiovascular:     Rate and Rhythm: Regular rhythm. Tachycardia present.     Pulses: Normal pulses.     Heart sounds: Normal heart sounds. No murmur.  Pulmonary:     Effort: Pulmonary effort is normal. No respiratory distress.     Breath sounds: No decreased breath sounds, wheezing, rhonchi or rales.  Chest:     Chest wall: No tenderness or crepitus.  Abdominal:     General: Abdomen is flat. Bowel sounds are normal. There is no distension.     Palpations: There is no mass.  Musculoskeletal:        General: No swelling or tenderness.  Skin:    Capillary Refill: Capillary refill takes less than 2 seconds.  Neurological:     Mental Status: She is alert and oriented to person,  place, and time.      UC Treatments / Results  Labs (all labs ordered are listed, but only abnormal results are displayed) Labs Reviewed  NOVEL CORONAVIRUS, NAA    EKG   Radiology No results found.  Procedures Procedures (including critical care time)  Medications Ordered in UC Medications - No data to display  Initial Impression / Assessment and Plan / UC Course  I have reviewed the triage vital signs and the nursing notes.  Pertinent labs & imaging results that were available during my care of the patient were reviewed by me and considered in my medical decision making (see chart for details).   Patient went to the Emergency department earlier, EKG was completed.  She was in sinus tachycardia.  After evaluation at the urgent care, patient was advised to [facility] for [facility].  Albuterol, Tessalon and ibuprofen was prescribed.  Final Clinical Impressions(s) / UC Diagnoses   Final diagnoses:  Suspected COVID-19 virus infection  SOB (shortness of breath)  Cough     Discharge Instructions     Advised patient to go to ED for further evaluation   COVID testing ordered.  It will take between 2-7 days for test results.  Someone will contact you regarding abnormal results.    In the meantime: You should remain isolated in your home for 10 days from symptom onset  Get plenty of rest and push fluids Tessalon Perles prescribed for cough Pro-Air for shortness of breath Use medications daily for symptom relief  Use OTC medications like ibuprofen  as needed fever or pain Call or go to the ED if you have any new or worsening symptoms such as fever, worsening cough, shortness of breath, chest tightness, chest pain, turning blue, changes in mental status, etc...     ED Prescriptions    Medication Sig Dispense Auth. Provider   benzonatate (TESSALON) 100 MG capsule Take 1 capsule (100 mg total) by mouth every 8 (eight) hours.  21 capsule Geofrey Silliman S, FNP   albuterol (VENTOLIN HFA) 108 (90 Base) MCG/ACT inhaler Inhale 1-2 puffs into the lungs every 6 (six) hours as needed for wheezing or shortness of breath. 18 g Roya Gieselman, March Rummage S, FNP   ibuprofen (ADVIL) 400 MG tablet Take 1 tablet (400 mg total) by mouth every 6 (six) hours as needed. 30 tablet Aaryanna Hyden, Zachery Dakins, FNP     PDMP not reviewed this encounter.   Durward Parcel, FNP 07/13/19 [phone]

## 2019-07-12 NOTE — Discharge Instructions (Addendum)
Advised patient to return to Emergency department for further evaluation   COVID testing ordered.  It will take between 2-7 days for test results.  Someone will contact you regarding abnormal results.    In the meantime: You should remain isolated in your home for 10 days from symptom onset  Get plenty of rest and push fluids Tessalon Perles prescribed for cough Pro-Air for shortness of breath Use medications daily for symptom relief Use OTC medications like ibuprofen  as needed fever or pain Call or go to the ED if you have any new or worsening symptoms such as fever, worsening cough, shortness of breath, chest tightness, chest pain, turning blue, changes in mental status, etc..Marland Kitchen

## 2019-07-12 NOTE — ED Triage Notes (Signed)
Pt has h/o pneumonia and has had chest discomfort, was checked into ED and left before being seen, pt had EKG in triage

## 2019-07-12 NOTE — ED Triage Notes (Signed)
Patient reports chest pain x 3 weeks but worsening over the past 1 to 2 hours. Patient also c/o SOB. Denies nausea or vomiting, reports some dizziness and lightheadedness.

## 2019-07-15 LAB — NOVEL CORONAVIRUS, NAA: SARS-CoV-2, NAA: NOT DETECTED

## 2019-12-24 ENCOUNTER — Emergency Department (HOSPITAL_COMMUNITY): Payer: Medicaid - Out of State

## 2019-12-24 ENCOUNTER — Encounter (HOSPITAL_COMMUNITY): Payer: Self-pay | Admitting: Emergency Medicine

## 2019-12-24 ENCOUNTER — Other Ambulatory Visit: Payer: Self-pay

## 2019-12-24 ENCOUNTER — Emergency Department (HOSPITAL_COMMUNITY)
Admission: EM | Admit: 2019-12-24 | Discharge: 2019-12-24 | Disposition: A | Discharge status: Home / Self Care | Payer: Medicaid - Out of State | Attending: Emergency Medicine | Admitting: Emergency Medicine

## 2019-12-24 DIAGNOSIS — I252 Old myocardial infarction: Secondary | ICD-10-CM | POA: Diagnosis not present

## 2019-12-24 DIAGNOSIS — R05 Cough: Secondary | ICD-10-CM | POA: Diagnosis present

## 2019-12-24 DIAGNOSIS — I10 Essential (primary) hypertension: Secondary | ICD-10-CM | POA: Insufficient documentation

## 2019-12-24 DIAGNOSIS — J111 Influenza due to unidentified influenza virus with other respiratory manifestations: Secondary | ICD-10-CM | POA: Diagnosis not present

## 2019-12-24 DIAGNOSIS — F17210 Nicotine dependence, cigarettes, uncomplicated: Secondary | ICD-10-CM | POA: Diagnosis not present

## 2019-12-24 DIAGNOSIS — Z79899 Other long term (current) drug therapy: Secondary | ICD-10-CM | POA: Insufficient documentation

## 2019-12-24 DIAGNOSIS — Z20822 Contact with and (suspected) exposure to covid-19: Secondary | ICD-10-CM

## 2019-12-24 DIAGNOSIS — R6889 Other general symptoms and signs: Secondary | ICD-10-CM

## 2019-12-24 LAB — SARS CORONAVIRUS 2 BY RT PCR (HOSPITAL ORDER, PERFORMED IN ~~LOC~~ HOSPITAL LAB): SARS Coronavirus 2: NEGATIVE

## 2019-12-24 NOTE — ED Provider Notes (Signed)
[facility] EMERGENCY DEPARTMENT Provider Note   CSN: [id number] Arrival date & time: 12/24/19  1654     History Chief Complaint  Patient presents with  . Covid Exposure    Melissa Butler is a 44 y.o. female with a past medical history of hypertension, elevated troponins with clean coronary arteries on cath, GERD, chronic abdominal pain, who presents today for evaluation of multiple complaints.  She is not vaccinated against coronavirus.  5 days ago she was around a friend who tested positive for coronavirus.  3 days ago she began developing cough, body aches, loss of sense of smell and taste, chills, fatigue, poor appetite, nausea without vomiting and watery diarrhea.  She denies any known fevers.  She states that her cough is occasionally productive.  She denies shortness of breath.  She denies any leg swelling.  She denies specific chest pain, stating that it hurts just like the rest of her body does.  No trauma.  She does smoke half a pack a day.    HPI     Past Medical History:  Diagnosis Date  . Anxiety   . Chronic abdominal pain   . Chronic pelvic pain in female   . Depression   . GERD (gastroesophageal reflux disease)   . History of cardiac catheterization 11/06/2015   normal coronary arteries  . History of echocardiogram 11/06/2015   normal  . Hypertension   . MI (myocardial infarction) (HCC) 10/2015   elevated troponin, likely due to vasospasm, clean coronary arteries on cath  . Panic attack     Patient Active Problem List   Diagnosis Date Noted  . Bipolar disorder with psychotic features (HCC) 04/08/2017  . Tobacco use disorder 09/16/2016  . 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 Benzodiazepine withdrawal (HCC) 09/15/2016  . PTSD (post-traumatic stress disorder) 09/15/2016  . HTN (hypertension) 09/14/2016  . GERD (gastroesophageal reflux disease) 09/14/2016  . Suicidal ideation 09/14/2016  . Bipolar 2 disorder, major  depressive episode (HCC) 09/13/2016  . Insomnia   . Anxiety   . 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 Panic disorder 01/04/2016  . NSTEMI (non-ST elevated myocardial infarction) (HCC) 11/06/2015  . Normal coronary arteries 11/06/15 11/06/2015    Past Surgical History:  Procedure Laterality Date  . CARDIAC CATHETERIZATION N/A 11/06/2015   Procedure: Left Heart Cath and Coronary Angiography;  Surgeon: Thurmon Fair, MD;  Location: [facility];  Service: Cardiovascular;  Laterality: N/A;  . CESAREAN SECTION    . NO PAST SURGERIES       OB History    Gravida      Para      Term      Preterm      AB      Living  2     SAB      TAB      Ectopic      Multiple      Live Births              History reviewed. No pertinent family history.  Social History   Tobacco Use  . Smoking status: Current Every Day Smoker    Packs/day: 0.50    Years: 5.00    Pack years: 2.50    Types: Cigarettes  . Smokeless tobacco: Never Used  Substance Use Topics  . Alcohol use: No  . Drug use: No    Home Medications Prior to Admission medications   Medication Sig Start Date End  Date Taking? Authorizing Provider  albuterol (VENTOLIN HFA) 108 (90 Base) MCG/ACT inhaler Inhale 1-2 puffs into the lungs every 6 (six) hours as needed for wheezing or shortness of breath. 07/12/19   Avegno, Zachery Dakins, FNP  beclomethasone (QVAR) 80 MCG/ACT inhaler Inhale 2 puffs into the lungs 2 (two) times daily.    [provider]  benzonatate (TESSALON) 100 MG capsule Take 1 capsule (100 mg total) by mouth every 8 (eight) hours. 07/12/19   Avegno, Zachery Dakins, FNP  cyclobenzaprine (FLEXERIL) 10 MG tablet Take 1 tablet (10 mg total) by mouth 3 (three) times daily as needed for muscle spasms. 05/07/18   Devoria Albe, MD  FLUoxetine (PROZAC) 40 MG capsule Take 1 capsule (40 mg total) by mouth at bedtime. Patient not taking: Reported on 04/08/2017 09/18/16   Pucilowska, Ellin Goodie, MD  gabapentin  (NEURONTIN) 100 MG capsule Take 2 capsules (200 mg total) by mouth 3 (three) times daily. Patient not taking: Reported on 04/08/2017 09/18/16   Shari Prows, MD  hydrOXYzine (ATARAX/VISTARIL) 50 MG tablet Take 1 tablet (50 mg total) by mouth 3 (three) times daily as needed for anxiety. Patient not taking: Reported on 04/08/2017 09/18/16   Pucilowska, Braulio Conte B, MD  ibuprofen (ADVIL) 400 MG tablet Take 1 tablet (400 mg total) by mouth every 6 (six) hours as needed. 07/12/19   Avegno, Zachery Dakins, FNP  Ibuprofen-Diphenhydramine HCl (ADVIL PM) 200-25 MG CAPS Take 2 capsules by mouth at bedtime as needed.    [provider]  metoprolol (LOPRESSOR) 50 MG tablet Take 1 tablet (50 mg total) by mouth 2 (two) times daily. Patient not taking: Reported on 04/08/2017 09/18/16   Kristine Linea B, MD  metoprolol tartrate (LOPRESSOR) 25 MG tablet Take 1 tablet (25 mg total) by mouth 2 (two) times daily. 09/18/16   Pucilowska, Jolanta B, MD  pantoprazole (PROTONIX) 40 MG tablet Take 1 tablet (40 mg total) by mouth daily. Patient not taking: Reported on 04/08/2017 09/18/16   Shari Prows, MD  traZODone (DESYREL) 100 MG tablet Take 2 tablets (200 mg total) by mouth at bedtime. 09/18/16   Pucilowska, Ellin Goodie, MD  ziprasidone (GEODON) 80 MG capsule Take 1 capsule (80 mg total) by mouth 2 (two) times daily with a meal. Patient not taking: Reported on 04/08/2017 09/18/16   Shari Prows, MD    Allergies    Ciprofloxacin, Penicillins, Toradol [ketorolac tromethamine], Amoxicillin, Codeine, Metronidazole, Saphris [asenapine], and Sulfa antibiotics  Review of Systems   Review of Systems  Constitutional: Positive for chills and fatigue. Negative for fever.  HENT: Negative for congestion.   Eyes: Negative for visual disturbance.  Respiratory: Positive for cough. Negative for shortness of breath.   Cardiovascular: Negative for palpitations and leg swelling.  Gastrointestinal: Positive for  diarrhea and nausea. Negative for constipation and vomiting.  Genitourinary: Negative for dysuria.  Musculoskeletal: Positive for arthralgias, back pain and myalgias.  Skin: Negative for color change, pallor, rash and wound.  Allergic/Immunologic:       No tick bites  Neurological: Positive for weakness (Global/generalized, subjective only) and headaches. Negative for syncope and speech difficulty.  All other systems reviewed and are negative.   Physical Exam Updated Vital Signs BP 129/70   Pulse 87   Temp 99.1 F (37.3 C) (Oral)   Resp 18   Ht 5\' 3"  (1.6 m)   Wt 99.8 kg   LMP 12/03/2019 (Approximate)   SpO2 95%   BMI 38.97 kg/m   Physical Exam Vitals  and nursing note reviewed.  Constitutional:      General: She is not in acute distress.    Appearance: She is well-developed. She is not ill-appearing.  HENT:     Head: Normocephalic and atraumatic.  Eyes:     Conjunctiva/sclera: Conjunctivae normal.  Cardiovascular:     Rate and Rhythm: Normal rate and regular rhythm.     Pulses: Normal pulses.     Heart sounds: Normal heart sounds. No murmur.  Pulmonary:     Effort: Pulmonary effort is normal. No respiratory distress.     Breath sounds: Normal breath sounds. No wheezing or rales.     Comments: Frequently coughs while I am in room.  Chest:     Chest wall: Tenderness (Mild, anterior chest) present.  Abdominal:     General: There is no distension.     Palpations: Abdomen is soft.     Tenderness: There is no abdominal tenderness. There is no guarding.  Musculoskeletal:     Cervical back: Normal range of motion and neck supple. No rigidity.  Skin:    General: Skin is warm and dry.  Neurological:     General: No focal deficit present.     Mental Status: She is alert and oriented to person, place, and time.     Cranial Nerves: No cranial nerve deficit.  Psychiatric:        Mood and Affect: Mood normal.        Behavior: Behavior normal.     ED Results / Procedures  / Treatments   Labs (all labs ordered are listed, but only abnormal results are displayed) Labs Reviewed  SARS CORONAVIRUS 2 BY RT PCR (HOSPITAL ORDER, [facility])    EKG None  Radiology DG Chest Portable 1 View  Result Date: 12/24/2019 CLINICAL DATA:  Suspected COVID 19 infection, cough EXAM: PORTABLE CHEST 1 VIEW COMPARISON:  05/07/2018 chest radiograph. FINDINGS: Stable cardiomediastinal silhouette with normal heart size. No pneumothorax. No pleural effusion. Lungs appear clear, with no acute consolidative airspace disease and no pulmonary edema. IMPRESSION: No active disease. Electronically Signed   By: Ilona Sorrel M.D.   On: 12/24/2019 19:33    Procedures Procedures (including critical care time)  Medications Ordered in ED Medications - No data to display  ED Course  I have reviewed the triage vital signs and the nursing notes.  Pertinent labs & imaging results that were available during my care of the patient were reviewed by me and considered in my medical decision making (see chart for details). RN had patient ambulate in room with pulse oximetry, she did not become hypoxic.   MDM Rules/Calculators/A&P                     Patient is a 44 year old woman who presents today for concern of Covid.  She has multiple Covid-like symptoms during known Covid exposure.  Here she is afebrile, not significantly tachycardic or tachypneic.  Chest x-ray is normal without consolidation or acute abnormalities.  Her Covid test was negative, however highly suspicious that she has Covid especially given her loss of smell and taste after a known Covid exposure.  Recommended that she get repeat testing in 3 days and that she needs to assume she is Covid.  Repeat testing was recommended only to allow for possible administration of monoclonal antibodies and other treatments.  Here she is able to ambulate without hypoxia.  No indication for current admission.  X-ray without  evidence of pneumonia, no indication for antibiotics at this time.  Lexey Fletes was evaluated in Emergency Department on 12/24/2019 for the symptoms described in the history of present illness. She was evaluated in the context of the global COVID-19 pandemic, which necessitated consideration that the patient might be at risk for infection with the SARS-CoV-2 virus that causes COVID-19. Institutional protocols and algorithms that pertain to the evaluation of patients at risk for COVID-19 are in a state of rapid change based on information released by regulatory bodies including the CDC and federal and state organizations. These policies and algorithms were followed during the patient's care in the ED.  Return precautions were discussed with patient who states their understanding.  At the time of discharge patient denied any unaddressed complaints or concerns.  Patient is agreeable for discharge home.  Note: Portions of this report may have been transcribed using voice recognition software. Every effort was made to ensure accuracy; however, inadvertent computerized transcription errors may be present  Final Clinical Impression(s) / ED Diagnoses Final diagnoses:  Suspected COVID-19 virus infection  Flu-like symptoms    Rx / DC Orders ED Discharge Orders    None       Norman Clay 12/24/19 2334    Maia Plan, MD 12/25/19 1343

## 2019-12-24 NOTE — ED Triage Notes (Signed)
Exposed 5 days ago to COVID by friend.  Pt has not had he vaccine.  Here for cough, body aches chest congestion, no smell or taste, chills, poor appetite, diarrhea watery (4-5 per day.)

## 2019-12-24 NOTE — Discharge Instructions (Addendum)
While your Covid test today was negative, I suspect that you do actually have Covid given that you had a exposure and or not vaccinated.  Please get retested in 3 days.  There are some emergency use treatments that need to be given within the first 10 days of symptoms.    If your second test is negative then please consider getting vaccinated against covid.  If you notice leg swelling, shortness of breath that is worse, start feeling light headed, are unable to stay hydrated or have other concerns please seek additional medical care and treatment.    If you develop worsening shortness of breath or other symptoms or have other concerns please seek additional medical care and evaluation.  Please use Tylenol as directed below.

## 2021-09-15 ENCOUNTER — Encounter: Payer: Medicaid Other | Admitting: Obstetrics & Gynecology

## 2021-10-13 ENCOUNTER — Encounter: Payer: Medicaid Other | Admitting: Adult Health

## 2021-10-13 ENCOUNTER — Encounter: Payer: Medicaid Other | Admitting: Obstetrics & Gynecology
